# Patient Record
Sex: Female | Born: 1950 | Race: White | Hispanic: No | Marital: Single | Smoking: Current every day smoker
Health system: Southern US, Community
[De-identification: ages and names within clinical notes are randomized; demographics above are authoritative.]

## PROBLEM LIST (undated history)

## (undated) DIAGNOSIS — F32A Depression, unspecified: Secondary | ICD-10-CM

## (undated) DIAGNOSIS — I10 Essential (primary) hypertension: Secondary | ICD-10-CM

## (undated) DIAGNOSIS — N289 Disorder of kidney and ureter, unspecified: Secondary | ICD-10-CM

## (undated) DIAGNOSIS — M549 Dorsalgia, unspecified: Secondary | ICD-10-CM

## (undated) DIAGNOSIS — F329 Major depressive disorder, single episode, unspecified: Secondary | ICD-10-CM

## (undated) HISTORY — PX: ABDOMINAL HYSTERECTOMY: SHX81

## (undated) HISTORY — PX: OTHER SURGICAL HISTORY: SHX169

## (undated) HISTORY — PX: CHOLECYSTECTOMY: SHX55

## (undated) HISTORY — PX: BACK SURGERY: SHX140

---

## 1997-06-07 ENCOUNTER — Encounter: Admission: RE | Admit: 1997-06-07 | Discharge: 1997-06-07 | Payer: Self-pay | Admitting: Sports Medicine

## 1997-06-23 ENCOUNTER — Encounter: Admission: RE | Admit: 1997-06-23 | Discharge: 1997-06-23 | Payer: Self-pay | Admitting: Family Medicine

## 1997-07-04 ENCOUNTER — Encounter: Admission: RE | Admit: 1997-07-04 | Discharge: 1997-07-04 | Payer: Self-pay | Admitting: Family Medicine

## 1997-07-25 ENCOUNTER — Encounter: Admission: RE | Admit: 1997-07-25 | Discharge: 1997-07-25 | Payer: Self-pay | Admitting: Family Medicine

## 1997-07-27 ENCOUNTER — Emergency Department (HOSPITAL_COMMUNITY): Admission: EM | Admit: 1997-07-27 | Discharge: 1997-07-27 | Payer: Self-pay | Admitting: Emergency Medicine

## 1997-07-28 ENCOUNTER — Encounter: Admission: RE | Admit: 1997-07-28 | Discharge: 1997-07-28 | Payer: Self-pay | Admitting: Family Medicine

## 1997-08-04 ENCOUNTER — Encounter: Admission: RE | Admit: 1997-08-04 | Discharge: 1997-08-04 | Payer: Self-pay | Admitting: Family Medicine

## 1997-08-08 ENCOUNTER — Observation Stay (HOSPITAL_COMMUNITY): Admission: EM | Admit: 1997-08-08 | Discharge: 1997-08-08 | Payer: Self-pay | Admitting: Emergency Medicine

## 1997-08-08 ENCOUNTER — Inpatient Hospital Stay (HOSPITAL_COMMUNITY): Admission: AD | Admit: 1997-08-08 | Discharge: 1997-08-12 | Payer: Self-pay | Admitting: Psychiatry

## 1997-08-11 ENCOUNTER — Encounter: Admission: RE | Admit: 1997-08-11 | Discharge: 1997-08-11 | Payer: Self-pay | Admitting: Family Medicine

## 1997-08-18 ENCOUNTER — Encounter: Admission: RE | Admit: 1997-08-18 | Discharge: 1997-08-18 | Payer: Self-pay | Admitting: Family Medicine

## 1997-08-24 ENCOUNTER — Ambulatory Visit (HOSPITAL_COMMUNITY): Admission: RE | Admit: 1997-08-24 | Discharge: 1997-08-24 | Payer: Self-pay | Admitting: Family Medicine

## 1997-08-29 ENCOUNTER — Encounter: Admission: RE | Admit: 1997-08-29 | Discharge: 1997-08-29 | Payer: Self-pay | Admitting: Family Medicine

## 1997-09-01 ENCOUNTER — Encounter: Admission: RE | Admit: 1997-09-01 | Discharge: 1997-09-01 | Payer: Self-pay | Admitting: Family Medicine

## 1997-09-08 ENCOUNTER — Encounter: Admission: RE | Admit: 1997-09-08 | Discharge: 1997-09-08 | Payer: Self-pay | Admitting: Family Medicine

## 1997-09-21 ENCOUNTER — Encounter: Admission: RE | Admit: 1997-09-21 | Discharge: 1997-09-21 | Payer: Self-pay | Admitting: Family Medicine

## 1997-09-29 ENCOUNTER — Ambulatory Visit (HOSPITAL_COMMUNITY): Admission: RE | Admit: 1997-09-29 | Discharge: 1997-09-29 | Payer: Self-pay | Admitting: *Deleted

## 1997-09-29 ENCOUNTER — Encounter: Admission: RE | Admit: 1997-09-29 | Discharge: 1997-09-29 | Payer: Self-pay | Admitting: Family Medicine

## 1997-09-30 ENCOUNTER — Encounter: Admission: RE | Admit: 1997-09-30 | Discharge: 1997-09-30 | Payer: Self-pay | Admitting: Family Medicine

## 1997-10-03 ENCOUNTER — Encounter: Admission: RE | Admit: 1997-10-03 | Discharge: 1997-10-03 | Payer: Self-pay | Admitting: Family Medicine

## 1997-10-10 ENCOUNTER — Encounter: Admission: RE | Admit: 1997-10-10 | Discharge: 1997-10-10 | Payer: Self-pay | Admitting: Family Medicine

## 1997-10-12 ENCOUNTER — Ambulatory Visit (HOSPITAL_COMMUNITY): Admission: RE | Admit: 1997-10-12 | Discharge: 1997-10-12 | Payer: Self-pay | Admitting: Family Medicine

## 1997-10-20 ENCOUNTER — Encounter: Admission: RE | Admit: 1997-10-20 | Discharge: 1997-10-20 | Payer: Self-pay | Admitting: Family Medicine

## 1997-10-25 ENCOUNTER — Encounter: Admission: RE | Admit: 1997-10-25 | Discharge: 1997-10-25 | Payer: Self-pay | Admitting: Sports Medicine

## 1997-10-26 ENCOUNTER — Ambulatory Visit (HOSPITAL_COMMUNITY): Admission: RE | Admit: 1997-10-26 | Discharge: 1997-10-26 | Payer: Self-pay | Admitting: Family Medicine

## 1997-11-03 ENCOUNTER — Encounter: Admission: RE | Admit: 1997-11-03 | Discharge: 1997-11-03 | Payer: Self-pay | Admitting: Family Medicine

## 1997-11-16 ENCOUNTER — Encounter: Admission: RE | Admit: 1997-11-16 | Discharge: 1997-11-16 | Payer: Self-pay | Admitting: Family Medicine

## 1997-11-28 ENCOUNTER — Encounter: Admission: RE | Admit: 1997-11-28 | Discharge: 1997-11-28 | Payer: Self-pay | Admitting: Family Medicine

## 1997-12-01 ENCOUNTER — Encounter: Admission: RE | Admit: 1997-12-01 | Discharge: 1997-12-01 | Payer: Self-pay | Admitting: Family Medicine

## 1997-12-04 ENCOUNTER — Emergency Department (HOSPITAL_COMMUNITY): Admission: EM | Admit: 1997-12-04 | Discharge: 1997-12-04 | Payer: Self-pay | Admitting: Emergency Medicine

## 1997-12-04 ENCOUNTER — Encounter: Payer: Self-pay | Admitting: Emergency Medicine

## 1997-12-08 ENCOUNTER — Encounter: Admission: RE | Admit: 1997-12-08 | Discharge: 1997-12-08 | Payer: Self-pay | Admitting: Family Medicine

## 1997-12-11 ENCOUNTER — Emergency Department (HOSPITAL_COMMUNITY): Admission: EM | Admit: 1997-12-11 | Discharge: 1997-12-11 | Payer: Self-pay | Admitting: Emergency Medicine

## 1997-12-13 ENCOUNTER — Encounter: Admission: RE | Admit: 1997-12-13 | Discharge: 1998-03-13 | Payer: Self-pay | Admitting: Family Medicine

## 1997-12-15 ENCOUNTER — Encounter: Admission: RE | Admit: 1997-12-15 | Discharge: 1997-12-15 | Payer: Self-pay | Admitting: Family Medicine

## 1997-12-19 ENCOUNTER — Encounter: Admission: RE | Admit: 1997-12-19 | Discharge: 1998-03-19 | Payer: Self-pay | Admitting: Anesthesiology

## 1997-12-29 ENCOUNTER — Encounter: Admission: RE | Admit: 1997-12-29 | Discharge: 1997-12-29 | Payer: Self-pay | Admitting: Family Medicine

## 1997-12-31 ENCOUNTER — Emergency Department (HOSPITAL_COMMUNITY): Admission: EM | Admit: 1997-12-31 | Discharge: 1997-12-31 | Payer: Self-pay

## 1998-01-05 ENCOUNTER — Encounter: Admission: RE | Admit: 1998-01-05 | Discharge: 1998-01-05 | Payer: Self-pay | Admitting: Family Medicine

## 1998-01-09 ENCOUNTER — Encounter: Admission: RE | Admit: 1998-01-09 | Discharge: 1998-04-09 | Payer: Self-pay | Admitting: Sports Medicine

## 1998-01-16 ENCOUNTER — Encounter: Admission: RE | Admit: 1998-01-16 | Discharge: 1998-01-16 | Payer: Self-pay | Admitting: Family Medicine

## 1998-01-19 ENCOUNTER — Encounter: Admission: RE | Admit: 1998-01-19 | Discharge: 1998-01-19 | Payer: Self-pay | Admitting: Family Medicine

## 1998-02-09 ENCOUNTER — Encounter: Admission: RE | Admit: 1998-02-09 | Discharge: 1998-02-09 | Payer: Self-pay | Admitting: Family Medicine

## 1998-03-01 ENCOUNTER — Encounter: Payer: Self-pay | Admitting: Sports Medicine

## 1998-03-01 ENCOUNTER — Ambulatory Visit (HOSPITAL_COMMUNITY): Admission: RE | Admit: 1998-03-01 | Discharge: 1998-03-01 | Payer: Self-pay | Admitting: Sports Medicine

## 1998-03-16 ENCOUNTER — Encounter: Payer: Self-pay | Admitting: Sports Medicine

## 1998-03-16 ENCOUNTER — Ambulatory Visit (HOSPITAL_COMMUNITY): Admission: RE | Admit: 1998-03-16 | Discharge: 1998-03-16 | Payer: Self-pay | Admitting: Sports Medicine

## 1998-04-05 ENCOUNTER — Encounter: Payer: Self-pay | Admitting: Sports Medicine

## 1998-04-05 ENCOUNTER — Ambulatory Visit (HOSPITAL_COMMUNITY): Admission: RE | Admit: 1998-04-05 | Discharge: 1998-04-05 | Payer: Self-pay | Admitting: Sports Medicine

## 1998-04-19 ENCOUNTER — Ambulatory Visit (HOSPITAL_COMMUNITY): Admission: RE | Admit: 1998-04-19 | Discharge: 1998-04-19 | Payer: Self-pay | Admitting: Sports Medicine

## 1998-04-19 ENCOUNTER — Encounter: Payer: Self-pay | Admitting: Sports Medicine

## 1998-04-20 ENCOUNTER — Encounter: Admission: RE | Admit: 1998-04-20 | Discharge: 1998-04-20 | Payer: Self-pay | Admitting: Family Medicine

## 1998-05-02 ENCOUNTER — Encounter: Admission: RE | Admit: 1998-05-02 | Discharge: 1998-05-02 | Payer: Self-pay | Admitting: Family Medicine

## 1998-05-05 ENCOUNTER — Encounter: Admission: RE | Admit: 1998-05-05 | Discharge: 1998-05-05 | Payer: Self-pay | Admitting: Family Medicine

## 1998-05-08 ENCOUNTER — Encounter: Admission: RE | Admit: 1998-05-08 | Discharge: 1998-05-08 | Payer: Self-pay | Admitting: Family Medicine

## 1998-05-18 ENCOUNTER — Encounter: Admission: RE | Admit: 1998-05-18 | Discharge: 1998-05-18 | Payer: Self-pay | Admitting: Family Medicine

## 1998-06-01 ENCOUNTER — Encounter: Admission: RE | Admit: 1998-06-01 | Discharge: 1998-06-01 | Payer: Self-pay | Admitting: Family Medicine

## 1998-06-20 ENCOUNTER — Encounter: Admission: RE | Admit: 1998-06-20 | Discharge: 1998-06-20 | Payer: Self-pay | Admitting: Family Medicine

## 1998-06-29 ENCOUNTER — Encounter: Admission: RE | Admit: 1998-06-29 | Discharge: 1998-06-29 | Payer: Self-pay | Admitting: Family Medicine

## 1998-07-17 ENCOUNTER — Inpatient Hospital Stay (HOSPITAL_COMMUNITY): Admission: EM | Admit: 1998-07-17 | Discharge: 1998-07-26 | Payer: Self-pay | Admitting: Emergency Medicine

## 1998-08-04 ENCOUNTER — Emergency Department (HOSPITAL_COMMUNITY): Admission: EM | Admit: 1998-08-04 | Discharge: 1998-08-04 | Payer: Self-pay | Admitting: Emergency Medicine

## 1998-08-10 ENCOUNTER — Encounter: Admission: RE | Admit: 1998-08-10 | Discharge: 1998-08-10 | Payer: Self-pay | Admitting: Family Medicine

## 1998-08-18 ENCOUNTER — Encounter: Admission: RE | Admit: 1998-08-18 | Discharge: 1998-08-18 | Payer: Self-pay | Admitting: Family Medicine

## 1998-08-21 ENCOUNTER — Encounter: Admission: RE | Admit: 1998-08-21 | Discharge: 1998-08-21 | Payer: Self-pay | Admitting: Family Medicine

## 1998-08-23 ENCOUNTER — Encounter: Admission: RE | Admit: 1998-08-23 | Discharge: 1998-09-25 | Payer: Self-pay | Admitting: Sports Medicine

## 1998-09-06 ENCOUNTER — Encounter: Admission: RE | Admit: 1998-09-06 | Discharge: 1998-09-06 | Payer: Self-pay | Admitting: Family Medicine

## 1998-09-09 ENCOUNTER — Emergency Department (HOSPITAL_COMMUNITY): Admission: EM | Admit: 1998-09-09 | Discharge: 1998-09-09 | Payer: Self-pay | Admitting: Emergency Medicine

## 1998-09-13 ENCOUNTER — Ambulatory Visit (HOSPITAL_COMMUNITY): Admission: RE | Admit: 1998-09-13 | Discharge: 1998-09-13 | Payer: Self-pay | Admitting: Urology

## 1998-09-13 ENCOUNTER — Encounter: Payer: Self-pay | Admitting: Urology

## 1998-09-18 ENCOUNTER — Encounter: Admission: RE | Admit: 1998-09-18 | Discharge: 1998-09-18 | Payer: Self-pay | Admitting: Family Medicine

## 1998-09-26 ENCOUNTER — Encounter: Admission: RE | Admit: 1998-09-26 | Discharge: 1998-09-26 | Payer: Self-pay | Admitting: Sports Medicine

## 1998-09-28 ENCOUNTER — Encounter: Admission: RE | Admit: 1998-09-28 | Discharge: 1998-09-28 | Payer: Self-pay | Admitting: Sports Medicine

## 1998-10-03 ENCOUNTER — Encounter: Admission: RE | Admit: 1998-10-03 | Discharge: 1998-10-03 | Payer: Self-pay | Admitting: Family Medicine

## 1998-10-05 ENCOUNTER — Encounter: Admission: RE | Admit: 1998-10-05 | Discharge: 1998-10-05 | Payer: Self-pay | Admitting: Family Medicine

## 1998-10-12 ENCOUNTER — Encounter: Admission: RE | Admit: 1998-10-12 | Discharge: 1998-10-12 | Payer: Self-pay | Admitting: Family Medicine

## 1998-10-19 ENCOUNTER — Encounter: Admission: RE | Admit: 1998-10-19 | Discharge: 1998-10-19 | Payer: Self-pay | Admitting: Family Medicine

## 1998-10-26 ENCOUNTER — Encounter: Admission: RE | Admit: 1998-10-26 | Discharge: 1998-10-26 | Payer: Self-pay | Admitting: Family Medicine

## 1998-11-23 ENCOUNTER — Encounter: Admission: RE | Admit: 1998-11-23 | Discharge: 1998-11-23 | Payer: Self-pay | Admitting: Family Medicine

## 1998-12-20 ENCOUNTER — Encounter: Admission: RE | Admit: 1998-12-20 | Discharge: 1998-12-20 | Payer: Self-pay | Admitting: Family Medicine

## 1998-12-26 ENCOUNTER — Encounter: Admission: RE | Admit: 1998-12-26 | Discharge: 1998-12-26 | Payer: Self-pay | Admitting: Family Medicine

## 1998-12-28 ENCOUNTER — Encounter: Admission: RE | Admit: 1998-12-28 | Discharge: 1998-12-28 | Payer: Self-pay | Admitting: Family Medicine

## 1999-01-04 ENCOUNTER — Encounter: Admission: RE | Admit: 1999-01-04 | Discharge: 1999-01-04 | Payer: Self-pay | Admitting: Family Medicine

## 1999-01-11 ENCOUNTER — Encounter: Admission: RE | Admit: 1999-01-11 | Discharge: 1999-01-11 | Payer: Self-pay | Admitting: Family Medicine

## 1999-01-15 ENCOUNTER — Ambulatory Visit (HOSPITAL_COMMUNITY): Admission: RE | Admit: 1999-01-15 | Discharge: 1999-01-15 | Payer: Self-pay | Admitting: Family Medicine

## 1999-01-16 ENCOUNTER — Encounter: Payer: Self-pay | Admitting: *Deleted

## 1999-01-16 ENCOUNTER — Emergency Department (HOSPITAL_COMMUNITY): Admission: EM | Admit: 1999-01-16 | Discharge: 1999-01-16 | Payer: Self-pay | Admitting: *Deleted

## 1999-01-18 ENCOUNTER — Encounter: Admission: RE | Admit: 1999-01-18 | Discharge: 1999-01-18 | Payer: Self-pay | Admitting: Family Medicine

## 1999-02-15 ENCOUNTER — Encounter: Admission: RE | Admit: 1999-02-15 | Discharge: 1999-02-15 | Payer: Self-pay | Admitting: Family Medicine

## 1999-02-22 ENCOUNTER — Encounter: Admission: RE | Admit: 1999-02-22 | Discharge: 1999-02-22 | Payer: Self-pay | Admitting: Family Medicine

## 1999-03-26 ENCOUNTER — Encounter: Admission: RE | Admit: 1999-03-26 | Discharge: 1999-03-26 | Payer: Self-pay | Admitting: Family Medicine

## 1999-04-02 ENCOUNTER — Encounter: Admission: RE | Admit: 1999-04-02 | Discharge: 1999-04-02 | Payer: Self-pay | Admitting: Family Medicine

## 1999-04-09 ENCOUNTER — Encounter: Admission: RE | Admit: 1999-04-09 | Discharge: 1999-04-09 | Payer: Self-pay | Admitting: Family Medicine

## 1999-04-20 ENCOUNTER — Encounter: Admission: RE | Admit: 1999-04-20 | Discharge: 1999-04-20 | Payer: Self-pay | Admitting: Family Medicine

## 1999-05-28 ENCOUNTER — Encounter: Admission: RE | Admit: 1999-05-28 | Discharge: 1999-05-28 | Payer: Self-pay | Admitting: Family Medicine

## 1999-06-01 ENCOUNTER — Encounter: Admission: RE | Admit: 1999-06-01 | Discharge: 1999-06-01 | Payer: Self-pay | Admitting: Sports Medicine

## 1999-06-04 ENCOUNTER — Encounter: Admission: RE | Admit: 1999-06-04 | Discharge: 1999-06-04 | Payer: Self-pay | Admitting: Family Medicine

## 1999-06-14 ENCOUNTER — Encounter: Admission: RE | Admit: 1999-06-14 | Discharge: 1999-06-14 | Payer: Self-pay | Admitting: Family Medicine

## 1999-06-22 ENCOUNTER — Encounter: Admission: RE | Admit: 1999-06-22 | Discharge: 1999-06-22 | Payer: Self-pay | Admitting: Family Medicine

## 1999-07-02 ENCOUNTER — Encounter: Admission: RE | Admit: 1999-07-02 | Discharge: 1999-07-02 | Payer: Self-pay | Admitting: Family Medicine

## 1999-07-16 ENCOUNTER — Encounter: Admission: RE | Admit: 1999-07-16 | Discharge: 1999-07-16 | Payer: Self-pay | Admitting: Family Medicine

## 1999-07-24 ENCOUNTER — Encounter: Admission: RE | Admit: 1999-07-24 | Discharge: 1999-07-24 | Payer: Self-pay | Admitting: Sports Medicine

## 1999-07-27 ENCOUNTER — Encounter: Admission: RE | Admit: 1999-07-27 | Discharge: 1999-07-27 | Payer: Self-pay | Admitting: Family Medicine

## 1999-07-27 ENCOUNTER — Emergency Department (HOSPITAL_COMMUNITY): Admission: EM | Admit: 1999-07-27 | Discharge: 1999-07-27 | Payer: Self-pay | Admitting: Emergency Medicine

## 1999-07-27 ENCOUNTER — Encounter: Admission: RE | Admit: 1999-07-27 | Discharge: 1999-07-27 | Payer: Self-pay

## 1999-07-29 ENCOUNTER — Emergency Department (HOSPITAL_COMMUNITY): Admission: EM | Admit: 1999-07-29 | Discharge: 1999-07-29 | Payer: Self-pay | Admitting: Emergency Medicine

## 1999-07-31 ENCOUNTER — Encounter: Admission: RE | Admit: 1999-07-31 | Discharge: 1999-07-31 | Payer: Self-pay | Admitting: Sports Medicine

## 1999-08-01 ENCOUNTER — Encounter: Admission: RE | Admit: 1999-08-01 | Discharge: 1999-08-01 | Payer: Self-pay | Admitting: Family Medicine

## 1999-08-05 ENCOUNTER — Emergency Department (HOSPITAL_COMMUNITY): Admission: EM | Admit: 1999-08-05 | Discharge: 1999-08-05 | Payer: Self-pay | Admitting: Emergency Medicine

## 1999-08-06 ENCOUNTER — Encounter: Admission: RE | Admit: 1999-08-06 | Discharge: 1999-08-06 | Payer: Self-pay | Admitting: Family Medicine

## 1999-08-09 ENCOUNTER — Encounter: Admission: RE | Admit: 1999-08-09 | Discharge: 1999-08-09 | Payer: Self-pay | Admitting: Family Medicine

## 1999-08-14 ENCOUNTER — Encounter: Payer: Self-pay | Admitting: Family Medicine

## 1999-08-14 ENCOUNTER — Encounter: Admission: RE | Admit: 1999-08-14 | Discharge: 1999-08-14 | Payer: Self-pay | Admitting: Family Medicine

## 1999-08-16 ENCOUNTER — Encounter: Admission: RE | Admit: 1999-08-16 | Discharge: 1999-08-16 | Payer: Self-pay | Admitting: Family Medicine

## 1999-08-18 ENCOUNTER — Emergency Department (HOSPITAL_COMMUNITY): Admission: EM | Admit: 1999-08-18 | Discharge: 1999-08-18 | Payer: Self-pay | Admitting: Emergency Medicine

## 1999-09-12 ENCOUNTER — Ambulatory Visit (HOSPITAL_COMMUNITY): Admission: RE | Admit: 1999-09-12 | Discharge: 1999-09-12 | Payer: Self-pay | Admitting: Gastroenterology

## 1999-09-28 ENCOUNTER — Encounter: Payer: Self-pay | Admitting: Internal Medicine

## 1999-09-28 ENCOUNTER — Emergency Department (HOSPITAL_COMMUNITY): Admission: EM | Admit: 1999-09-28 | Discharge: 1999-09-28 | Payer: Self-pay | Admitting: Internal Medicine

## 1999-12-24 ENCOUNTER — Encounter: Admission: RE | Admit: 1999-12-24 | Discharge: 1999-12-24 | Payer: Self-pay | Admitting: Family Medicine

## 2000-02-21 ENCOUNTER — Encounter: Admission: RE | Admit: 2000-02-21 | Discharge: 2000-02-21 | Payer: Self-pay | Admitting: Family Medicine

## 2000-02-21 ENCOUNTER — Encounter: Payer: Self-pay | Admitting: Family Medicine

## 2000-03-27 ENCOUNTER — Encounter: Admission: RE | Admit: 2000-03-27 | Discharge: 2000-03-27 | Payer: Self-pay | Admitting: Family Medicine

## 2002-08-08 ENCOUNTER — Encounter: Payer: Self-pay | Admitting: Emergency Medicine

## 2002-08-08 ENCOUNTER — Emergency Department (HOSPITAL_COMMUNITY): Admission: EM | Admit: 2002-08-08 | Discharge: 2002-08-08 | Payer: Self-pay | Admitting: Emergency Medicine

## 2002-08-10 ENCOUNTER — Encounter: Payer: Self-pay | Admitting: Interventional Cardiology

## 2002-08-10 ENCOUNTER — Inpatient Hospital Stay (HOSPITAL_COMMUNITY): Admission: EM | Admit: 2002-08-10 | Discharge: 2002-08-11 | Payer: Self-pay | Admitting: Emergency Medicine

## 2003-04-23 ENCOUNTER — Emergency Department (HOSPITAL_COMMUNITY): Admission: EM | Admit: 2003-04-23 | Discharge: 2003-04-23 | Payer: Self-pay | Admitting: Emergency Medicine

## 2004-01-27 ENCOUNTER — Emergency Department (HOSPITAL_COMMUNITY): Admission: EM | Admit: 2004-01-27 | Discharge: 2004-01-27 | Payer: Self-pay

## 2004-07-24 ENCOUNTER — Emergency Department (HOSPITAL_COMMUNITY): Admission: EM | Admit: 2004-07-24 | Discharge: 2004-07-24 | Payer: Self-pay | Admitting: Emergency Medicine

## 2004-07-26 ENCOUNTER — Emergency Department (HOSPITAL_COMMUNITY): Admission: EM | Admit: 2004-07-26 | Discharge: 2004-07-26 | Payer: Self-pay | Admitting: Emergency Medicine

## 2004-08-04 ENCOUNTER — Emergency Department (HOSPITAL_COMMUNITY): Admission: EM | Admit: 2004-08-04 | Discharge: 2004-08-04 | Payer: Self-pay | Admitting: Emergency Medicine

## 2004-09-11 ENCOUNTER — Emergency Department (HOSPITAL_COMMUNITY): Admission: EM | Admit: 2004-09-11 | Discharge: 2004-09-11 | Payer: Self-pay | Admitting: Emergency Medicine

## 2004-10-01 ENCOUNTER — Emergency Department (HOSPITAL_COMMUNITY): Admission: EM | Admit: 2004-10-01 | Discharge: 2004-10-01 | Payer: Self-pay | Admitting: Emergency Medicine

## 2004-10-23 ENCOUNTER — Encounter: Admission: RE | Admit: 2004-10-23 | Discharge: 2004-10-23 | Payer: Self-pay | Admitting: Internal Medicine

## 2004-10-30 ENCOUNTER — Encounter: Admission: RE | Admit: 2004-10-30 | Discharge: 2004-10-30 | Payer: Self-pay | Admitting: Internal Medicine

## 2004-11-05 ENCOUNTER — Emergency Department (HOSPITAL_COMMUNITY): Admission: EM | Admit: 2004-11-05 | Discharge: 2004-11-05 | Payer: Self-pay | Admitting: *Deleted

## 2004-11-08 ENCOUNTER — Emergency Department (HOSPITAL_COMMUNITY): Admission: EM | Admit: 2004-11-08 | Discharge: 2004-11-08 | Payer: Self-pay | Admitting: *Deleted

## 2004-12-05 ENCOUNTER — Emergency Department (HOSPITAL_COMMUNITY): Admission: EM | Admit: 2004-12-05 | Discharge: 2004-12-05 | Payer: Self-pay | Admitting: Emergency Medicine

## 2004-12-24 ENCOUNTER — Emergency Department (HOSPITAL_COMMUNITY): Admission: EM | Admit: 2004-12-24 | Discharge: 2004-12-24 | Payer: Self-pay | Admitting: Emergency Medicine

## 2005-01-03 ENCOUNTER — Other Ambulatory Visit: Payer: Self-pay

## 2005-01-03 ENCOUNTER — Emergency Department (HOSPITAL_COMMUNITY): Admission: EM | Admit: 2005-01-03 | Discharge: 2005-01-03 | Payer: Self-pay | Admitting: Emergency Medicine

## 2005-01-04 ENCOUNTER — Other Ambulatory Visit: Payer: Self-pay

## 2005-01-09 ENCOUNTER — Encounter (INDEPENDENT_AMBULATORY_CARE_PROVIDER_SITE_OTHER): Payer: Self-pay | Admitting: Specialist

## 2005-01-09 ENCOUNTER — Ambulatory Visit (HOSPITAL_COMMUNITY): Admission: RE | Admit: 2005-01-09 | Discharge: 2005-01-09 | Payer: Self-pay | Admitting: Gastroenterology

## 2005-01-11 ENCOUNTER — Emergency Department (HOSPITAL_COMMUNITY): Admission: EM | Admit: 2005-01-11 | Discharge: 2005-01-11 | Payer: Self-pay | Admitting: Emergency Medicine

## 2005-01-12 ENCOUNTER — Emergency Department (HOSPITAL_COMMUNITY): Admission: EM | Admit: 2005-01-12 | Discharge: 2005-01-12 | Payer: Self-pay | Admitting: Emergency Medicine

## 2005-02-15 ENCOUNTER — Emergency Department (HOSPITAL_COMMUNITY): Admission: EM | Admit: 2005-02-15 | Discharge: 2005-02-15 | Payer: Self-pay | Admitting: Emergency Medicine

## 2005-02-16 ENCOUNTER — Emergency Department (HOSPITAL_COMMUNITY): Admission: EM | Admit: 2005-02-16 | Discharge: 2005-02-16 | Payer: Self-pay | Admitting: Emergency Medicine

## 2005-02-19 ENCOUNTER — Encounter: Admission: RE | Admit: 2005-02-19 | Discharge: 2005-02-19 | Payer: Self-pay | Admitting: Gastroenterology

## 2005-03-22 ENCOUNTER — Emergency Department (HOSPITAL_COMMUNITY): Admission: EM | Admit: 2005-03-22 | Discharge: 2005-03-22 | Payer: Self-pay | Admitting: Emergency Medicine

## 2005-04-04 ENCOUNTER — Observation Stay (HOSPITAL_COMMUNITY): Admission: EM | Admit: 2005-04-04 | Discharge: 2005-04-05 | Payer: Self-pay | Admitting: Emergency Medicine

## 2005-04-09 ENCOUNTER — Emergency Department (HOSPITAL_COMMUNITY): Admission: EM | Admit: 2005-04-09 | Discharge: 2005-04-09 | Payer: Self-pay | Admitting: Emergency Medicine

## 2005-05-02 ENCOUNTER — Encounter (INDEPENDENT_AMBULATORY_CARE_PROVIDER_SITE_OTHER): Payer: Self-pay | Admitting: *Deleted

## 2005-05-02 ENCOUNTER — Encounter: Admission: RE | Admit: 2005-05-02 | Discharge: 2005-05-02 | Payer: Self-pay | Admitting: Endocrinology

## 2005-05-02 ENCOUNTER — Other Ambulatory Visit: Admission: RE | Admit: 2005-05-02 | Discharge: 2005-05-02 | Payer: Self-pay | Admitting: Interventional Radiology

## 2005-05-08 ENCOUNTER — Emergency Department (HOSPITAL_COMMUNITY): Admission: EM | Admit: 2005-05-08 | Discharge: 2005-05-08 | Payer: Self-pay | Admitting: Emergency Medicine

## 2005-05-16 ENCOUNTER — Emergency Department (HOSPITAL_COMMUNITY): Admission: EM | Admit: 2005-05-16 | Discharge: 2005-05-16 | Payer: Self-pay | Admitting: Emergency Medicine

## 2005-05-17 ENCOUNTER — Emergency Department (HOSPITAL_COMMUNITY): Admission: EM | Admit: 2005-05-17 | Discharge: 2005-05-18 | Payer: Self-pay | Admitting: Emergency Medicine

## 2005-05-19 ENCOUNTER — Emergency Department (HOSPITAL_COMMUNITY): Admission: EM | Admit: 2005-05-19 | Discharge: 2005-05-19 | Payer: Self-pay | Admitting: Emergency Medicine

## 2005-05-28 ENCOUNTER — Emergency Department (HOSPITAL_COMMUNITY): Admission: EM | Admit: 2005-05-28 | Discharge: 2005-05-28 | Payer: Self-pay | Admitting: Emergency Medicine

## 2005-06-18 ENCOUNTER — Emergency Department (HOSPITAL_COMMUNITY): Admission: EM | Admit: 2005-06-18 | Discharge: 2005-06-18 | Payer: Self-pay | Admitting: Emergency Medicine

## 2005-06-20 ENCOUNTER — Observation Stay (HOSPITAL_COMMUNITY): Admission: EM | Admit: 2005-06-20 | Discharge: 2005-06-20 | Payer: Self-pay | Admitting: Emergency Medicine

## 2005-06-21 ENCOUNTER — Emergency Department (HOSPITAL_COMMUNITY): Admission: EM | Admit: 2005-06-21 | Discharge: 2005-06-22 | Payer: Self-pay | Admitting: Emergency Medicine

## 2005-07-17 ENCOUNTER — Emergency Department (HOSPITAL_COMMUNITY): Admission: EM | Admit: 2005-07-17 | Discharge: 2005-07-17 | Payer: Self-pay | Admitting: *Deleted

## 2005-08-10 ENCOUNTER — Emergency Department (HOSPITAL_COMMUNITY): Admission: EM | Admit: 2005-08-10 | Discharge: 2005-08-10 | Payer: Self-pay | Admitting: Emergency Medicine

## 2005-08-19 ENCOUNTER — Emergency Department (HOSPITAL_COMMUNITY): Admission: EM | Admit: 2005-08-19 | Discharge: 2005-08-19 | Payer: Self-pay | Admitting: Emergency Medicine

## 2005-08-20 ENCOUNTER — Emergency Department (HOSPITAL_COMMUNITY): Admission: EM | Admit: 2005-08-20 | Discharge: 2005-08-20 | Payer: Self-pay | Admitting: Emergency Medicine

## 2005-09-06 ENCOUNTER — Emergency Department (HOSPITAL_COMMUNITY): Admission: EM | Admit: 2005-09-06 | Discharge: 2005-09-06 | Payer: Self-pay | Admitting: *Deleted

## 2005-09-25 ENCOUNTER — Emergency Department (HOSPITAL_COMMUNITY): Admission: EM | Admit: 2005-09-25 | Discharge: 2005-09-25 | Payer: Self-pay | Admitting: Emergency Medicine

## 2005-09-30 ENCOUNTER — Emergency Department (HOSPITAL_COMMUNITY): Admission: EM | Admit: 2005-09-30 | Discharge: 2005-09-30 | Payer: Self-pay | Admitting: *Deleted

## 2005-10-02 ENCOUNTER — Emergency Department (HOSPITAL_COMMUNITY): Admission: EM | Admit: 2005-10-02 | Discharge: 2005-10-02 | Payer: Self-pay | Admitting: Emergency Medicine

## 2005-10-03 ENCOUNTER — Emergency Department (HOSPITAL_COMMUNITY): Admission: EM | Admit: 2005-10-03 | Discharge: 2005-10-03 | Payer: Self-pay | Admitting: Emergency Medicine

## 2005-10-07 ENCOUNTER — Emergency Department (HOSPITAL_COMMUNITY): Admission: EM | Admit: 2005-10-07 | Discharge: 2005-10-07 | Payer: Self-pay | Admitting: Emergency Medicine

## 2005-10-07 ENCOUNTER — Emergency Department (HOSPITAL_COMMUNITY): Admission: EM | Admit: 2005-10-07 | Discharge: 2005-10-08 | Payer: Self-pay | Admitting: Emergency Medicine

## 2005-10-09 ENCOUNTER — Emergency Department (HOSPITAL_COMMUNITY): Admission: EM | Admit: 2005-10-09 | Discharge: 2005-10-09 | Payer: Self-pay | Admitting: Emergency Medicine

## 2005-10-11 ENCOUNTER — Inpatient Hospital Stay (HOSPITAL_COMMUNITY): Admission: EM | Admit: 2005-10-11 | Discharge: 2005-10-12 | Payer: Self-pay | Admitting: Emergency Medicine

## 2005-10-13 ENCOUNTER — Emergency Department (HOSPITAL_COMMUNITY): Admission: EM | Admit: 2005-10-13 | Discharge: 2005-10-13 | Payer: Self-pay | Admitting: *Deleted

## 2005-10-23 ENCOUNTER — Emergency Department (HOSPITAL_COMMUNITY): Admission: EM | Admit: 2005-10-23 | Discharge: 2005-10-23 | Payer: Self-pay | Admitting: Emergency Medicine

## 2005-11-23 ENCOUNTER — Emergency Department (HOSPITAL_COMMUNITY): Admission: EM | Admit: 2005-11-23 | Discharge: 2005-11-24 | Payer: Self-pay | Admitting: Emergency Medicine

## 2005-12-12 ENCOUNTER — Emergency Department (HOSPITAL_COMMUNITY): Admission: EM | Admit: 2005-12-12 | Discharge: 2005-12-12 | Payer: Self-pay | Admitting: Emergency Medicine

## 2005-12-31 ENCOUNTER — Encounter: Admission: RE | Admit: 2005-12-31 | Discharge: 2005-12-31 | Payer: Self-pay | Admitting: Orthopedic Surgery

## 2006-01-22 ENCOUNTER — Encounter: Admission: RE | Admit: 2006-01-22 | Discharge: 2006-01-22 | Payer: Self-pay | Admitting: Orthopedic Surgery

## 2006-02-10 ENCOUNTER — Emergency Department (HOSPITAL_COMMUNITY): Admission: EM | Admit: 2006-02-10 | Discharge: 2006-02-10 | Payer: Self-pay | Admitting: Emergency Medicine

## 2006-02-11 ENCOUNTER — Emergency Department (HOSPITAL_COMMUNITY): Admission: EM | Admit: 2006-02-11 | Discharge: 2006-02-11 | Payer: Self-pay | Admitting: Family Medicine

## 2006-02-12 ENCOUNTER — Emergency Department (HOSPITAL_COMMUNITY): Admission: EM | Admit: 2006-02-12 | Discharge: 2006-02-12 | Payer: Self-pay | Admitting: Emergency Medicine

## 2006-02-14 ENCOUNTER — Observation Stay (HOSPITAL_COMMUNITY): Admission: EM | Admit: 2006-02-14 | Discharge: 2006-02-15 | Payer: Self-pay | Admitting: Emergency Medicine

## 2006-02-16 ENCOUNTER — Emergency Department (HOSPITAL_COMMUNITY): Admission: EM | Admit: 2006-02-16 | Discharge: 2006-02-16 | Payer: Self-pay | Admitting: Emergency Medicine

## 2006-02-20 ENCOUNTER — Emergency Department (HOSPITAL_COMMUNITY): Admission: EM | Admit: 2006-02-20 | Discharge: 2006-02-20 | Payer: Self-pay | Admitting: Emergency Medicine

## 2006-02-26 ENCOUNTER — Encounter: Admission: RE | Admit: 2006-02-26 | Discharge: 2006-02-26 | Payer: Self-pay | Admitting: *Deleted

## 2006-03-06 ENCOUNTER — Emergency Department (HOSPITAL_COMMUNITY): Admission: EM | Admit: 2006-03-06 | Discharge: 2006-03-06 | Payer: Self-pay | Admitting: Emergency Medicine

## 2006-03-06 ENCOUNTER — Encounter: Admission: RE | Admit: 2006-03-06 | Discharge: 2006-03-06 | Payer: Self-pay | Admitting: Gastroenterology

## 2006-05-02 ENCOUNTER — Emergency Department (HOSPITAL_COMMUNITY): Admission: EM | Admit: 2006-05-02 | Discharge: 2006-05-02 | Payer: Self-pay | Admitting: Emergency Medicine

## 2006-05-10 ENCOUNTER — Emergency Department (HOSPITAL_COMMUNITY): Admission: EM | Admit: 2006-05-10 | Discharge: 2006-05-11 | Payer: Self-pay | Admitting: Emergency Medicine

## 2006-05-12 ENCOUNTER — Emergency Department (HOSPITAL_COMMUNITY): Admission: EM | Admit: 2006-05-12 | Discharge: 2006-05-13 | Payer: Self-pay | Admitting: Emergency Medicine

## 2006-05-12 ENCOUNTER — Emergency Department (HOSPITAL_COMMUNITY): Admission: EM | Admit: 2006-05-12 | Discharge: 2006-05-12 | Payer: Self-pay | Admitting: Emergency Medicine

## 2006-05-14 ENCOUNTER — Emergency Department (HOSPITAL_COMMUNITY): Admission: EM | Admit: 2006-05-14 | Discharge: 2006-05-15 | Payer: Self-pay | Admitting: Emergency Medicine

## 2006-05-25 ENCOUNTER — Emergency Department (HOSPITAL_COMMUNITY): Admission: EM | Admit: 2006-05-25 | Discharge: 2006-05-26 | Payer: Self-pay | Admitting: Emergency Medicine

## 2006-05-27 ENCOUNTER — Emergency Department (HOSPITAL_COMMUNITY): Admission: EM | Admit: 2006-05-27 | Discharge: 2006-05-27 | Payer: Self-pay | Admitting: *Deleted

## 2006-05-29 ENCOUNTER — Ambulatory Visit (HOSPITAL_COMMUNITY): Admission: AD | Admit: 2006-05-29 | Discharge: 2006-05-29 | Payer: Self-pay | Admitting: Urology

## 2006-05-31 ENCOUNTER — Emergency Department (HOSPITAL_COMMUNITY): Admission: EM | Admit: 2006-05-31 | Discharge: 2006-05-31 | Payer: Self-pay | Admitting: Emergency Medicine

## 2006-06-01 ENCOUNTER — Emergency Department (HOSPITAL_COMMUNITY): Admission: EM | Admit: 2006-06-01 | Discharge: 2006-06-02 | Payer: Self-pay | Admitting: *Deleted

## 2006-06-02 ENCOUNTER — Inpatient Hospital Stay (HOSPITAL_COMMUNITY): Admission: EM | Admit: 2006-06-02 | Discharge: 2006-06-04 | Payer: Self-pay | Admitting: Urology

## 2006-06-20 ENCOUNTER — Encounter: Admission: RE | Admit: 2006-06-20 | Discharge: 2006-06-20 | Payer: Self-pay | Admitting: *Deleted

## 2006-07-29 ENCOUNTER — Emergency Department (HOSPITAL_COMMUNITY): Admission: EM | Admit: 2006-07-29 | Discharge: 2006-07-29 | Payer: Self-pay | Admitting: *Deleted

## 2006-08-04 ENCOUNTER — Emergency Department (HOSPITAL_COMMUNITY): Admission: EM | Admit: 2006-08-04 | Discharge: 2006-08-04 | Payer: Self-pay | Admitting: Emergency Medicine

## 2006-08-15 ENCOUNTER — Emergency Department (HOSPITAL_COMMUNITY): Admission: EM | Admit: 2006-08-15 | Discharge: 2006-08-16 | Payer: Self-pay | Admitting: Emergency Medicine

## 2006-08-20 ENCOUNTER — Emergency Department (HOSPITAL_COMMUNITY): Admission: EM | Admit: 2006-08-20 | Discharge: 2006-08-20 | Payer: Self-pay | Admitting: Emergency Medicine

## 2006-10-09 ENCOUNTER — Emergency Department (HOSPITAL_COMMUNITY): Admission: EM | Admit: 2006-10-09 | Discharge: 2006-10-09 | Payer: Self-pay | Admitting: Emergency Medicine

## 2006-11-13 ENCOUNTER — Emergency Department (HOSPITAL_COMMUNITY): Admission: EM | Admit: 2006-11-13 | Discharge: 2006-11-13 | Payer: Self-pay | Admitting: Emergency Medicine

## 2006-11-25 ENCOUNTER — Emergency Department (HOSPITAL_COMMUNITY): Admission: EM | Admit: 2006-11-25 | Discharge: 2006-11-25 | Payer: Self-pay | Admitting: Emergency Medicine

## 2006-11-30 ENCOUNTER — Emergency Department (HOSPITAL_COMMUNITY): Admission: EM | Admit: 2006-11-30 | Discharge: 2006-12-01 | Payer: Self-pay | Admitting: Emergency Medicine

## 2006-12-04 ENCOUNTER — Emergency Department (HOSPITAL_COMMUNITY): Admission: EM | Admit: 2006-12-04 | Discharge: 2006-12-05 | Payer: Self-pay | Admitting: Emergency Medicine

## 2006-12-14 ENCOUNTER — Emergency Department (HOSPITAL_COMMUNITY): Admission: EM | Admit: 2006-12-14 | Discharge: 2006-12-14 | Payer: Self-pay | Admitting: Emergency Medicine

## 2006-12-19 ENCOUNTER — Emergency Department (HOSPITAL_COMMUNITY): Admission: EM | Admit: 2006-12-19 | Discharge: 2006-12-19 | Payer: Self-pay | Admitting: Emergency Medicine

## 2006-12-26 ENCOUNTER — Ambulatory Visit (HOSPITAL_COMMUNITY): Admission: RE | Admit: 2006-12-26 | Discharge: 2006-12-26 | Payer: Self-pay | Admitting: Urology

## 2007-01-03 ENCOUNTER — Emergency Department (HOSPITAL_COMMUNITY): Admission: EM | Admit: 2007-01-03 | Discharge: 2007-01-03 | Payer: Self-pay | Admitting: Emergency Medicine

## 2007-01-12 ENCOUNTER — Emergency Department (HOSPITAL_COMMUNITY): Admission: EM | Admit: 2007-01-12 | Discharge: 2007-01-13 | Payer: Self-pay | Admitting: Emergency Medicine

## 2007-01-21 ENCOUNTER — Emergency Department (HOSPITAL_COMMUNITY): Admission: EM | Admit: 2007-01-21 | Discharge: 2007-01-21 | Payer: Self-pay | Admitting: Emergency Medicine

## 2007-01-23 ENCOUNTER — Emergency Department (HOSPITAL_COMMUNITY): Admission: EM | Admit: 2007-01-23 | Discharge: 2007-01-23 | Payer: Self-pay | Admitting: Emergency Medicine

## 2007-02-16 ENCOUNTER — Emergency Department (HOSPITAL_COMMUNITY): Admission: EM | Admit: 2007-02-16 | Discharge: 2007-02-16 | Payer: Self-pay | Admitting: Emergency Medicine

## 2007-02-20 ENCOUNTER — Ambulatory Visit (HOSPITAL_COMMUNITY): Admission: RE | Admit: 2007-02-20 | Discharge: 2007-02-20 | Payer: Self-pay | Admitting: Urology

## 2007-03-02 ENCOUNTER — Observation Stay (HOSPITAL_COMMUNITY): Admission: EM | Admit: 2007-03-02 | Discharge: 2007-03-03 | Payer: Self-pay | Admitting: Emergency Medicine

## 2007-03-03 ENCOUNTER — Emergency Department (HOSPITAL_COMMUNITY): Admission: EM | Admit: 2007-03-03 | Discharge: 2007-03-04 | Payer: Self-pay | Admitting: Emergency Medicine

## 2007-04-08 ENCOUNTER — Encounter (HOSPITAL_COMMUNITY): Admission: RE | Admit: 2007-04-08 | Discharge: 2007-07-07 | Payer: Self-pay | Admitting: Endocrinology

## 2007-04-25 ENCOUNTER — Emergency Department (HOSPITAL_COMMUNITY): Admission: EM | Admit: 2007-04-25 | Discharge: 2007-04-26 | Payer: Self-pay | Admitting: Emergency Medicine

## 2007-04-27 ENCOUNTER — Emergency Department (HOSPITAL_COMMUNITY): Admission: EM | Admit: 2007-04-27 | Discharge: 2007-04-27 | Payer: Self-pay | Admitting: Emergency Medicine

## 2007-05-20 ENCOUNTER — Emergency Department (HOSPITAL_COMMUNITY): Admission: EM | Admit: 2007-05-20 | Discharge: 2007-05-20 | Payer: Self-pay | Admitting: Emergency Medicine

## 2007-06-07 ENCOUNTER — Emergency Department (HOSPITAL_COMMUNITY): Admission: EM | Admit: 2007-06-07 | Discharge: 2007-06-07 | Payer: Self-pay | Admitting: Emergency Medicine

## 2007-06-26 ENCOUNTER — Emergency Department (HOSPITAL_COMMUNITY): Admission: EM | Admit: 2007-06-26 | Discharge: 2007-06-27 | Payer: Self-pay | Admitting: Emergency Medicine

## 2007-06-28 ENCOUNTER — Emergency Department (HOSPITAL_COMMUNITY): Admission: EM | Admit: 2007-06-28 | Discharge: 2007-06-28 | Payer: Self-pay | Admitting: Emergency Medicine

## 2007-07-13 ENCOUNTER — Encounter: Admission: RE | Admit: 2007-07-13 | Discharge: 2007-07-13 | Payer: Self-pay | Admitting: Internal Medicine

## 2007-07-25 ENCOUNTER — Emergency Department (HOSPITAL_COMMUNITY): Admission: EM | Admit: 2007-07-25 | Discharge: 2007-07-25 | Payer: Self-pay | Admitting: Emergency Medicine

## 2007-08-30 ENCOUNTER — Emergency Department (HOSPITAL_COMMUNITY): Admission: EM | Admit: 2007-08-30 | Discharge: 2007-08-30 | Payer: Self-pay | Admitting: Family Medicine

## 2007-09-06 ENCOUNTER — Emergency Department (HOSPITAL_COMMUNITY): Admission: EM | Admit: 2007-09-06 | Discharge: 2007-09-06 | Payer: Self-pay | Admitting: Emergency Medicine

## 2007-09-27 ENCOUNTER — Inpatient Hospital Stay (HOSPITAL_COMMUNITY): Admission: EM | Admit: 2007-09-27 | Discharge: 2007-09-29 | Payer: Self-pay | Admitting: Emergency Medicine

## 2007-09-29 ENCOUNTER — Encounter (INDEPENDENT_AMBULATORY_CARE_PROVIDER_SITE_OTHER): Payer: Self-pay | Admitting: Internal Medicine

## 2007-10-06 ENCOUNTER — Encounter (HOSPITAL_COMMUNITY): Admission: RE | Admit: 2007-10-06 | Discharge: 2007-11-25 | Payer: Self-pay | Admitting: Endocrinology

## 2007-11-10 ENCOUNTER — Encounter: Admission: RE | Admit: 2007-11-10 | Discharge: 2007-11-10 | Payer: Self-pay | Admitting: Oral Surgery

## 2007-11-27 ENCOUNTER — Emergency Department (HOSPITAL_COMMUNITY): Admission: EM | Admit: 2007-11-27 | Discharge: 2007-11-28 | Payer: Self-pay | Admitting: Anesthesiology

## 2007-12-20 ENCOUNTER — Emergency Department (HOSPITAL_BASED_OUTPATIENT_CLINIC_OR_DEPARTMENT_OTHER): Admission: EM | Admit: 2007-12-20 | Discharge: 2007-12-20 | Payer: Self-pay | Admitting: Emergency Medicine

## 2008-01-04 ENCOUNTER — Ambulatory Visit (HOSPITAL_COMMUNITY): Admission: RE | Admit: 2008-01-04 | Discharge: 2008-01-04 | Payer: Self-pay | Admitting: Endocrinology

## 2008-01-07 ENCOUNTER — Encounter: Admission: RE | Admit: 2008-01-07 | Discharge: 2008-01-07 | Payer: Self-pay | Admitting: Orthopedic Surgery

## 2008-01-25 ENCOUNTER — Ambulatory Visit (HOSPITAL_COMMUNITY): Admission: RE | Admit: 2008-01-25 | Discharge: 2008-01-25 | Payer: Self-pay | Admitting: Oral Surgery

## 2008-01-25 ENCOUNTER — Encounter (INDEPENDENT_AMBULATORY_CARE_PROVIDER_SITE_OTHER): Payer: Self-pay | Admitting: Oral Surgery

## 2008-06-13 ENCOUNTER — Emergency Department (HOSPITAL_COMMUNITY): Admission: EM | Admit: 2008-06-13 | Discharge: 2008-06-13 | Payer: Self-pay | Admitting: Emergency Medicine

## 2008-11-08 ENCOUNTER — Encounter: Admission: RE | Admit: 2008-11-08 | Discharge: 2008-11-08 | Payer: Self-pay | Admitting: Orthopedic Surgery

## 2008-11-22 ENCOUNTER — Encounter: Admission: RE | Admit: 2008-11-22 | Discharge: 2008-11-22 | Payer: Self-pay | Admitting: Orthopedic Surgery

## 2009-01-15 ENCOUNTER — Emergency Department (HOSPITAL_COMMUNITY): Admission: EM | Admit: 2009-01-15 | Discharge: 2009-01-15 | Payer: Self-pay | Admitting: Emergency Medicine

## 2009-04-20 ENCOUNTER — Encounter: Admission: RE | Admit: 2009-04-20 | Discharge: 2009-04-20 | Payer: Self-pay | Admitting: Orthopedic Surgery

## 2009-05-10 ENCOUNTER — Encounter: Admission: RE | Admit: 2009-05-10 | Discharge: 2009-05-10 | Payer: Self-pay | Admitting: Orthopaedic Surgery

## 2009-06-12 ENCOUNTER — Encounter: Admission: RE | Admit: 2009-06-12 | Discharge: 2009-06-12 | Payer: Self-pay | Admitting: Orthopaedic Surgery

## 2009-07-28 IMAGING — CT CT ABDOMEN W/O CM
2 of 4 series · 17 of 46 positions shown, 19 images · non-contrast
Comparison: Prior CT 09/28/2007 and 06/07/2007.

CT ABDOMEN

CLINICAL DATA: Right flank pain today.  History of kidney stones.

CT ABDOMEN AND PELVIS WITHOUT CONTRAST
TECHNIQUE: Multidetector CT imaging of the abdomen and pelvis was
performed following the standard
protocol without intravenous contrast.

[Series 2: stone_wo 5.0 b40f st · axial · 0.57mm/px · z∈[-466,-118]mm · 14 of 95 slices shown, 16 images]
[im 4/95  soft-tissue]
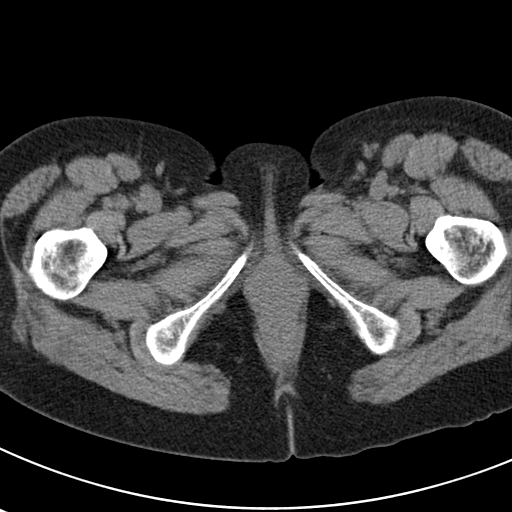
[im 4/95  bone]
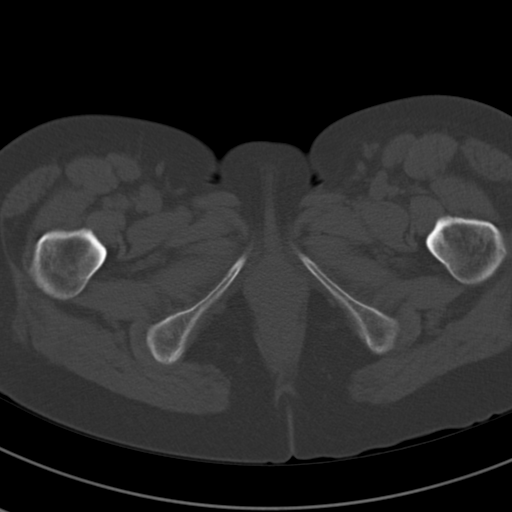
[im 11/95  soft-tissue]
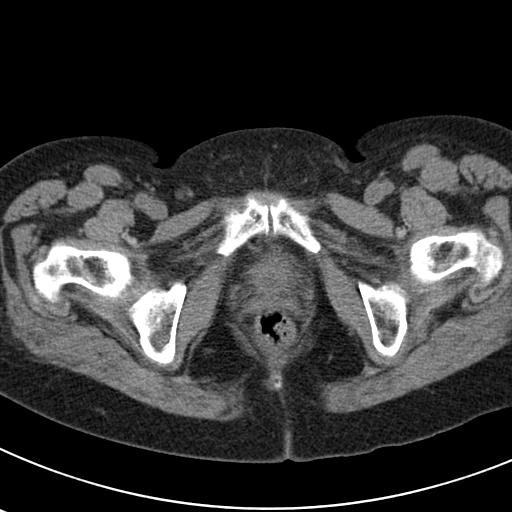
[im 19/95  soft-tissue]
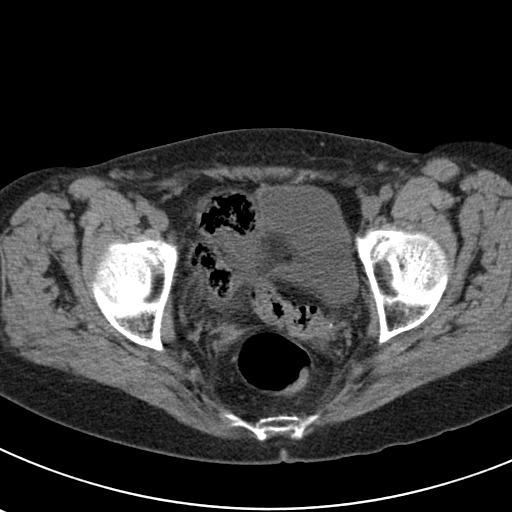
[im 26/95  soft-tissue]
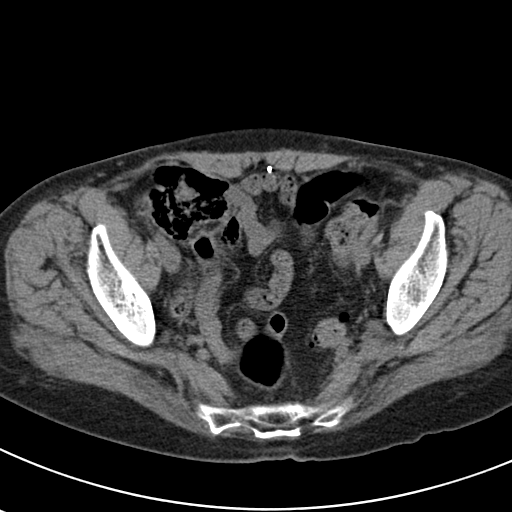
[im 33/95  soft-tissue]
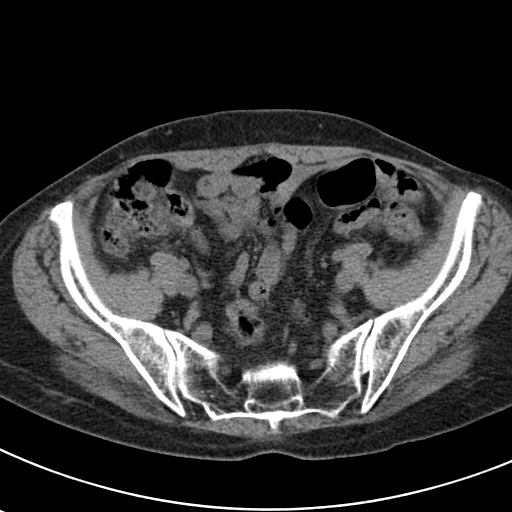
[im 37/95  soft-tissue]
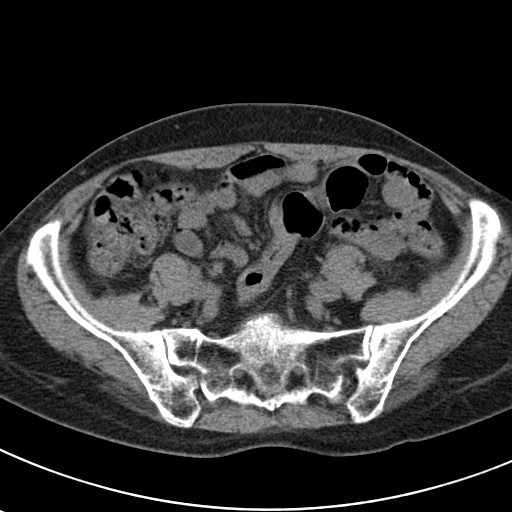
[im 44/95  soft-tissue]
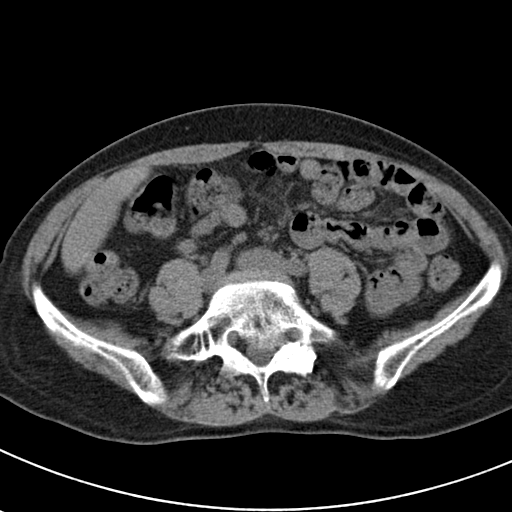
[im 51/95  soft-tissue]
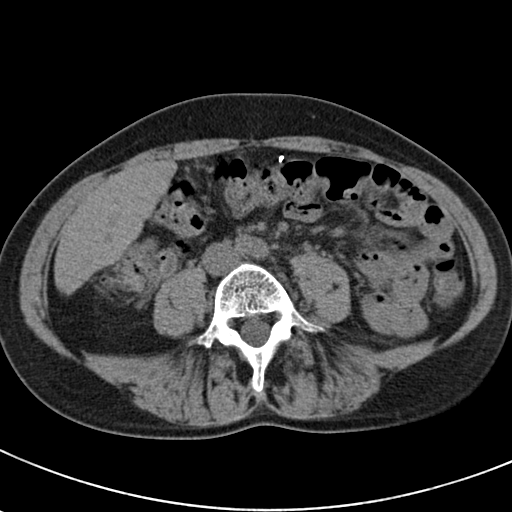
[im 58/95  soft-tissue]
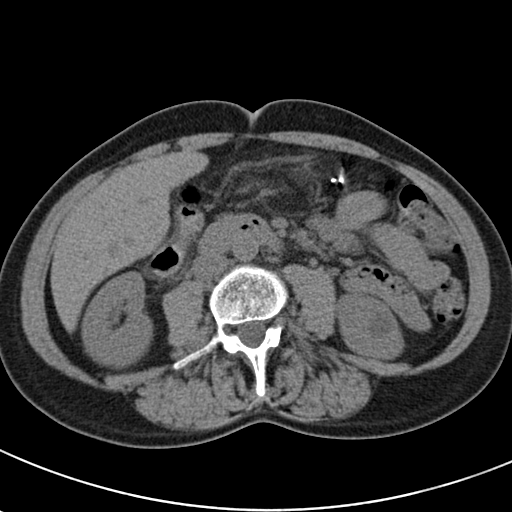
[im 58/95  bone]
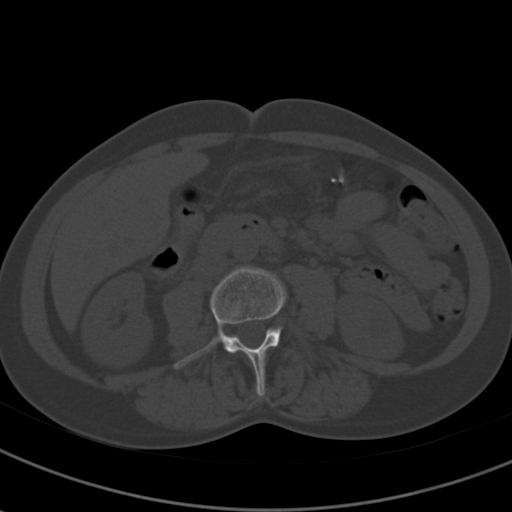
[im 62/95  soft-tissue]
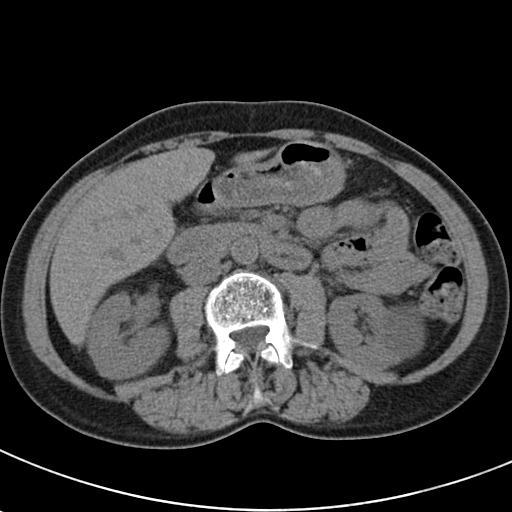
[im 69/95  soft-tissue]
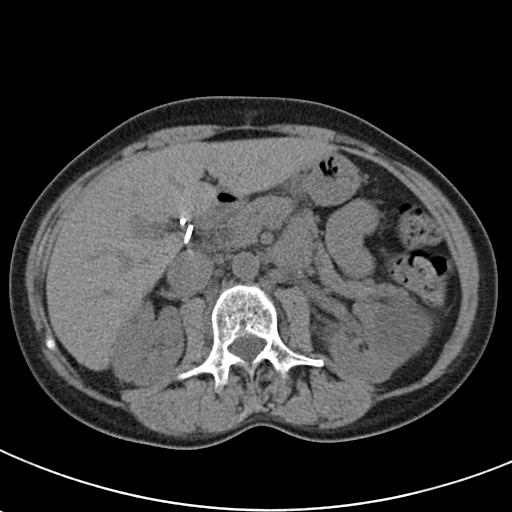
[im 76/95  soft-tissue]
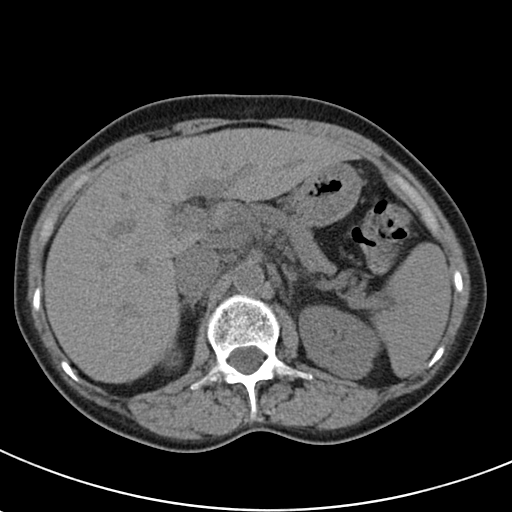
[im 84/95  soft-tissue]
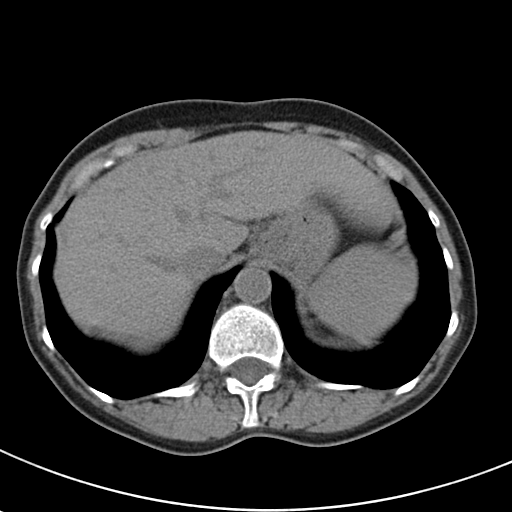
[im 91/95  soft-tissue]
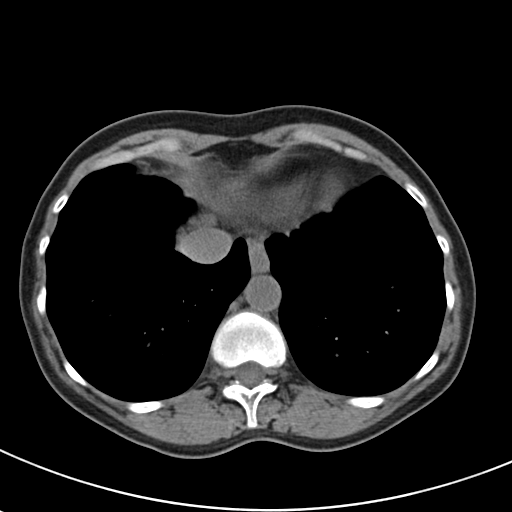

[Series 602: coronal abdomen · coronal · 0.77mm/px · 3 of 94 slices shown]
[im 32/94  soft-tissue]
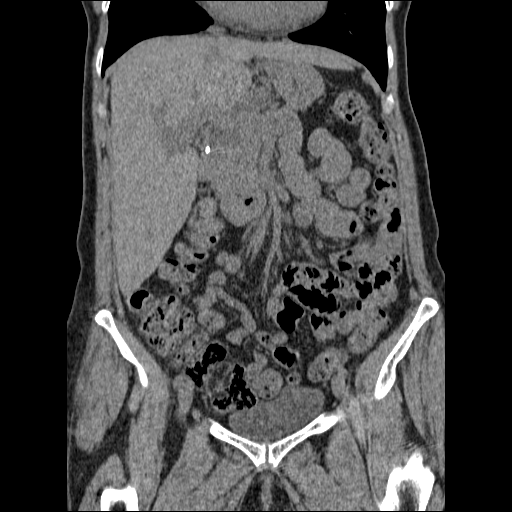
[im 42/94  soft-tissue]
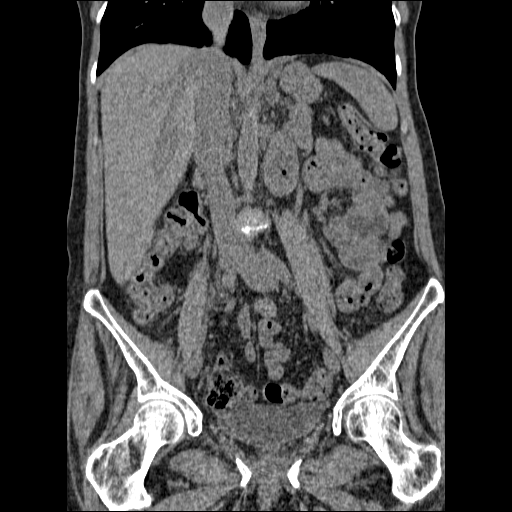
[im 52/94  soft-tissue]
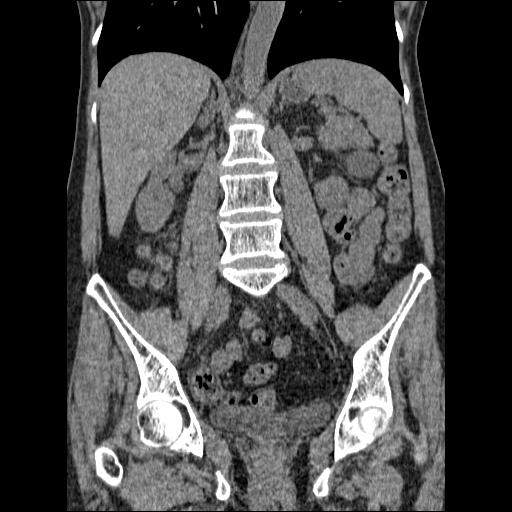

[17 of 46 positions shown; findings below may reference images not displayed]

FINDINGS: The lung bases are clear.  There is no pleural effusion.
No renal or ureteral calculi are demonstrated.  There is no
hydronephrosis or perinephric soft tissue stranding.  Bilateral
renal cysts are again noted.

As imaged in the noncontrast state, the liver, spleen, pancreas and
adrenal glands appear unremarkable.  The gallbladder is surgically
absent.  Duodenal and gastric fundal diverticula are again noted.
No inflammatory changes are evident.
IMPRESSION: 1.  No evidence of urinary tract calculus or hydronephrosis.
2.  No acute abdominal findings.
3.  Bilateral renal cysts.

CT PELVIS
FINDINGS: Distally, the ureters appear normal in caliber.  There
is no evidence of ureteral or bladder calculus.  No pelvic mass,
fluid collection or inflammatory process is demonstrated.  There
are surgical clips in the anterior aspect of the false pelvis.
IMPRESSION: 1.  No evidence of urinary tract calculus or hydronephrosis.
2.  No pelvic inflammatory changes demonstrated.

## 2009-09-22 ENCOUNTER — Emergency Department (HOSPITAL_COMMUNITY): Admission: EM | Admit: 2009-09-22 | Discharge: 2009-09-22 | Payer: Self-pay | Admitting: Emergency Medicine

## 2010-03-24 ENCOUNTER — Encounter: Payer: Self-pay | Admitting: Gastroenterology

## 2010-03-25 ENCOUNTER — Encounter: Payer: Self-pay | Admitting: Orthopedic Surgery

## 2010-03-25 ENCOUNTER — Encounter: Payer: Self-pay | Admitting: Gastroenterology

## 2010-05-19 LAB — POCT CARDIAC MARKERS
Myoglobin, poc: 94.3 ng/mL (ref 12–200)
Troponin i, poc: <0.05 ng/mL (ref 0.00–0.09)

## 2010-05-19 LAB — BASIC METABOLIC PANEL
CO2: 27 mEq/L (ref 19–32)
Calcium: 9.5 mg/dL (ref 8.4–10.5)
Chloride: 102 mEq/L (ref 96–112)
Creatinine, Ser: 0.79 mg/dL (ref 0.4–1.2)
GFR calc Af Amer: >60 mL/min (ref >60)
Glucose, Bld: 117 mg/dL — ABNORMAL HIGH (ref 70–99)

## 2010-05-19 LAB — DIFFERENTIAL
Basophils Relative: 1 % (ref 0–1)
Eosinophils Absolute: 0.1 10*3/uL (ref 0.0–0.7)
Eosinophils Relative: 4 % (ref 0–5)
Lymphs Abs: 1.6 10*3/uL (ref 0.7–4.0)
Monocytes Relative: 16 % — ABNORMAL HIGH (ref 3–12)

## 2010-05-19 LAB — CBC
MCH: 33.6 pg (ref 26.0–34.0)
MCHC: 34.9 g/dL (ref 30.0–36.0)
MCV: 96.1 fL (ref 78.0–100.0)
Platelets: 233 10*3/uL (ref 150–400)
RBC: 4.13 MIL/uL (ref 3.87–5.11)

## 2010-06-06 LAB — COMPREHENSIVE METABOLIC PANEL
ALT: 15 U/L (ref 0–35)
BUN: 18 mg/dL (ref 6–23)
CO2: 26 mEq/L (ref 19–32)
Calcium: 9.1 mg/dL (ref 8.4–10.5)
Creatinine, Ser: 0.73 mg/dL (ref 0.4–1.2)
GFR calc non Af Amer: >60 mL/min (ref >60)
Glucose, Bld: 117 mg/dL — ABNORMAL HIGH (ref 70–99)
Sodium: 136 mEq/L (ref 135–145)

## 2010-06-06 LAB — URINALYSIS, ROUTINE W REFLEX MICROSCOPIC
Ketones, ur: 15 mg/dL — AB
Leukocytes, UA: NEGATIVE
Nitrite: NEGATIVE
Protein, ur: 30 mg/dL — AB
Urobilinogen, UA: 1 mg/dL (ref 0.0–1.0)

## 2010-06-06 LAB — CBC
HCT: 41.9 % (ref 36.0–46.0)
Hemoglobin: 14.3 g/dL (ref 12.0–15.0)
MCHC: 34.2 g/dL (ref 30.0–36.0)
MCV: 96.2 fL (ref 78.0–100.0)
RBC: 4.36 MIL/uL (ref 3.87–5.11)

## 2010-06-06 LAB — DIFFERENTIAL
Eosinophils Relative: 0 % (ref 0–5)
Monocytes Absolute: 1.8 10*3/uL — ABNORMAL HIGH (ref 0.1–1.0)
Neutrophils Relative %: 66 % (ref 43–77)

## 2010-06-06 LAB — LIPASE, BLOOD: Lipase: 41 U/L (ref 11–59)

## 2010-07-17 NOTE — Consult Note (Signed)
NAME:  [name], [name] NO.:  [id number]   MEDICAL RECORD NO.:  [id number]          PATIENT TYPE:  INP   LOCATION:  4703                         FACILITY:  [facility]   PHYSICIAN:  Armanda Magic, M.D.     DATE OF BIRTH:  [birth year]   DATE OF CONSULTATION:  09/27/2007  DATE OF DISCHARGE:                                 CONSULTATION   REFERRING PHYSICIAN:  Fleet Contras, MD.   PRIMARY CARDIOLOGIST:  Lyn Records, MD   CHIEF COMPLAINT:  Chest pain.   HISTORY OF PRESENT ILLNESS:  This is a 60 year old white female with a  history of tobacco abuse, COPD, unspecified personality disorder, and  normal coronary arteries by cath in April 2007 who was admitted with  chest pain.  She awakened this morning with substernal chest pain across  both sides of her chest, but no radiation to the neck or arms.  The pain  is described as an aching sharp pain.  The patient complains of very  tender chest wall with movement or palpation and increased chest pain  with lying down.  Her chest pain is recreated with palpation of the  chest wall.  We are now asked to consult.   Past medical history includes normal coronary arteries by cath in April  2007, hypertension, COPD, hemorrhoids, tobacco abuse, unspecified  personality disorder, vitamin D deficiency, and anxiety.   PAST SURGICAL HISTORY:  Status post total abdominal hysterectomy, status  post cholecystectomy, and status post hemorrhoidectomy.   SOCIAL HISTORY:  She denies any alcohol use.  She was a former smoker.   FAMILY HISTORY:  Her mother had coronary artery disease with CABG.   REVIEW OF SYSTEMS:  Other than what is stated in the HPI is negative.   MEDICATIONS:  1. Aspirin 81 mg a day.  2. Clonazepam 0.5 mg p.r.n.  3. HCTZ 25 mg a day.  4. Vitamin B.   PHYSICAL EXAMINATION:  VITAL SIGNS:  Blood pressure is 133/87, pulse 86,  respirations 18, and O2 saturation is 99% on room air.  GENERAL:  This a well-developed and  well-nourished female, in no acute  distress.  HEENT:  Benign.  NECK:  Supple without lymphadenopathy.  Carotid upstrokes +2  bilaterally.  No bruits.  CHEST:  Lungs are clear to auscultation throughout.  Chest wall is  extremely tender to palpation over the sternum and both the right and  left sides of the chest, which completely recreates her chest pain.  HEART:  Regular rate and rhythm.  No murmurs, rubs, or gallops.  Normal  S1 and S2.  ABDOMEN:  Soft, nontender, and nondistended with active bowel sounds.  No hepatosplenomegaly.  EXTREMITIES:  No edema.   LABS:  Point-of-care markers negative x3.  CPK 43, MB 1.1, troponin less  than 0.01.  CBC is normal.  Sodium 140, potassium 4.7, chloride 103,  bicarb 29, BUN 10, creatinine 0.6.  EKG shows sinus rhythm at 83 beats  per minute with RSR prime in D1.  There are no ST changes.   ASSESSMENT:  1. Chest pain with negative  cardiac enzymes and no EKG changes.  Chest      pain is very atypical and consistent mainly with costochondritis.  2. Normal coronary arteries by cath in April 2007.  3. Anxiety.  4. Hypertension, stable.  5. Chronic obstructive pulmonary disease.  6. Personality disorder.   PLAN:  1. Check a 2D echocardiogram to rule out pericardial effusion.  Check      a D-dimer.  We will start Motrin 600 mg q.6 h. for chest wall pain,      Nexium 40 mg b.i.d. for GI prophylaxis.  Further workup per Dr.      Katrinka Blazing.  Possible outpatient stress test.      Armanda Magic, M.D.  Electronically Signed     TT/MEDQ  D:  09/27/2007  T:  09/27/2007  Job:  [id number]   cc:   Fleet Contras, M.D.  Lyn Records, M.D.

## 2010-07-17 NOTE — Op Note (Signed)
NAME:  [name], [name]                 ACCOUNT NO.:  [id number]   MEDICAL RECORD NO.:  [id number]          PATIENT TYPE:  AMB   LOCATION:  DAY                          FACILITY:  [facility]   PHYSICIAN:  Sigmund I. Patsi Sears, M.D.DATE OF BIRTH:  Jan 09, 1951   DATE OF PROCEDURE:  12/26/2006  DATE OF DISCHARGE:                               OPERATIVE REPORT   PREOPERATIVE DIAGNOSIS:  Right renal pelvic obstructing stone.   POSTOPERATIVE DIAGNOSIS:  Right renal pelvic obstructing stone.   OPERATION:  1. Cystourethroscopy.  2. Right retrograde pyelography.  3. Right double-J catheter insertion, 4.8 x 24 cm.   SURGEON:  Sigmund I. Patsi Sears, M.D.   ANESTHESIA:  General LMA.   PREPARATION:  After appropriate preanesthesia, the patient was brought  to the operating room, placed on the operating room in the dorsal supine  position, where general LMA anesthesia was introduced.  She was then  replaced in the dorsal lithotomy position, where the pubis was prepped  with Betadine solution and draped in the usual fashion.   REVIEW OF HISTORY:  This 60 year old female has a history of continued  right flank pain, which has remained unabated since October 2.  It is  accompanied by nausea.  The patient was unable to become comfortable  taking muscle relaxant medication and was seen in the [facility]  emergency room three times since September 11 with various urinary tract  problems.  CT scan shows a possible 3-mm stone in the right lower pole  infundibulum with obstruction.  She is now for cystoscopy, retrograde  pyelography and possible double-J catheter insertion.   PROCEDURE:  Cystourethroscopy was accomplished and shows a normal-  appearing bladder.  Right retrograde pyelogram was performed, which  shows normal-caliber ureter with no hydronephrosis and no stones and no  tumors.  There is a suggestion of a small stone in the right renal  pelvis, and it is elected to place double-J catheter.  A  4.8 x 24 cm  double-J catheter is then inserted over guidewire and coiled in the  renal pelvis and in the bladder.  The abnormality identified on  retrograde at the end of procedure is in the mid pole infundibulum,  which is wide now.  Double J is an excellent position.  The patient will  be observed for pain relief.  She was awakened and taken to the recovery  room in good condition.      Sigmund I. Patsi Sears, M.D.  Electronically Signed    SIT/MEDQ  D:  12/26/2006  T:  12/27/2006  Job:  [id number]

## 2010-07-17 NOTE — Consult Note (Signed)
NAME:  [name], [name]                 ACCOUNT NO.:  [id number]   MEDICAL RECORD NO.:  [id number]          PATIENT TYPE:  INP   LOCATION:  1443                         FACILITY:  [facility]   PHYSICIAN:  Sigmund I. Patsi Sears, M.D.DATE OF BIRTH:  1950/11/28   DATE OF CONSULTATION:  DATE OF DISCHARGE:                                 CONSULTATION   CHIEF COMPLAINT:  The patient is seen in the emergency room today with  complaint of severe right sided flank pain, lower abdominal pain, and  nausea and vomiting.   HISTORY OF PRESENT ILLNESS:  This is a 60 year old female that is well  known to our office that has presented to our office with distal right  ureteral stone and hydronephrosis.  The patient was originally scheduled  to have surgery by Dr. Patsi Sears for cystourethroscopy and retrograde  pyelogram basket extraction and possible double-J stent placement as an  outpatient at [facility] on March 09, 2007.  The patient is seen today  with complaint of severe colicky right flank pain and severe nausea and  vomiting.  The patient rates the pain as 8/10.  The symptoms have been  associated with nausea and vomiting.  There has not been any dysuria,  fever, or hematuria.  The patient has a significant history of medical  conditions similar symptoms previously recently evaluated.   REVIEW OF SYSTEMS:  Significant for nausea, vomiting, abdominal pain,  left flank pain, urgency, and frequency.  All other systems reviewed  with the patient and were negative.   PAST MEDICAL HISTORY:  1. Hypertension.  2. Kidney stones.   SOCIAL HISTORY:  Significant for denies ETOH, drug abuse.  She is a  current smoker of approximately 1 pack per day.   ALLERGIES:  1. COMPAZINE.  2. ZOFRAN.   HOME MEDICATIONS:  Clonazepam daily, hydrocodone p.r.n., Phenergan  p.r.n.   PHYSICAL EXAMINATION:  GENERAL:  Shows a well-developed, well-nourished,  white female in no acute distress.  HEENT:  Negative.  NECK:   Supple, nontender, no nodes.  HEART:  Regular rhythm and rate.  LUNGS:  Clear to P&A.  ABDOMEN:  Soft, nontender.  Positive bowel sounds.  No organomegaly or  masses.  GU:  Shows normal female genitalia with a Foley in the meatus.  EXTREMITIES:  Normal without clubbing or cyanosis.  NEUROLOGIC:  Intact.  The patient is alert and oriented and cooperative.  VITAL SIGNS:  Stable and the patient is afebrile.   LABORATORY:  Shows urine today WBCs are 3-6 per high-powered field, RBC  0-2, there is small bilirubin and a trace of leukocytes, and moderate  blood.  BNP today is normal.  BUN is 15, creatinine 0.7.  CBC is normal.  WBC is 4.4, RBC is 4.34.  The patient had a nuclear medicine renal scan  that showed no evidence of obstructive uropathy, with split renal  function equal to 50.2% from the left kidney and 49.8 from the right  kidney, dilated right renal collecting system consistent with  hydronephrosis without evidence for high grade obstruction.   ASSESSMENT:  Distal right renal ureteral stone  with hydronephrosis.   PLAN:  The patient will be taken to the OR this afternoon for  cystoscopy, ureteroscopy, retrograde pyelogram, basket extraction, and  possible double-J stent placement by Dr. Patsi Sears.      Jetta Lout, NP      Sigmund I. Patsi Sears, M.D.  Electronically Signed    DW/MEDQ  D:  03/02/2007  T:  03/02/2007  Job:  [id number]

## 2010-07-17 NOTE — Discharge Summary (Signed)
NAME:  [name], [name]                 ACCOUNT NO.:  [id number]   MEDICAL RECORD NO.:  [id number]          PATIENT TYPE:  INP   LOCATION:  1422                         [facility]ACILITY:  [facility]   PHYSICIAN:  Sigmund I. Patsi Sears, M.D.DATE OF BIRTH:  May 24, 1950   DATE OF ADMISSION:  06/02/2006  DATE OF DISCHARGE:  06/04/2006                               DISCHARGE SUMMARY   FINAL DIAGNOSES:  1. Flank pain.  2. Severe anxiety reaction.   OPERATIONS:  None.   HISTORY:  [name] is a 60 year old single female with a history of  left flank pain and gross hematuria.  She had bilateral ureteroscopy and  stone removal with left double-J catheter placed.  Because of an intense  left flank pain and severe nausea, the patient appeared in the emergency  room.  She was agitated and angry, demanded hospital admission, and  threatened suicide and also to kill her mother if she was not admitted  to the hospital for IV pain medications.  The patient finally agreed to  cystoscopy and removal of her double-J catheter, and this was  accomplished in the office prior to admission.  Patient was admitted for  IV antibiotics, pain control, and psychiatric consultation.  She has a  known psychiatric history but admits that she has been given  antihypertensive medications (Paxil) in the past, which she does not  take because I do not like to take pills.   Her past history is significant for hypertension treated with stent,  hypokalemia.   ALLERGIES:  ZOFRAN.   MEDICATIONS:  1. Vasotec 5 mg a day.  2. Aspirin.  3. Cipro.   TOBACCO:  Unknown.   FAMILY HISTORY:  As dictated in H&P on June 02, 2006.   REVIEW OF SYSTEMS:  As dictated in H&P on June 02, 2006.   PHYSICAL EXAMINATION:  As dictated in H&P on June 02, 2006.   HOSPITAL COURSE:  Patient was admitted for IV antibiotics and pain  control.  She had psychiatric consultation with a diagnosis of acute  anxiety disorder and was treated by the psychiatry  service.  She was  given outpatient psychiatric followup.  She was allowed to be discharged  on June 04, 2006, stable.  She will return to the office if she has  further problems.      Sigmund I. Patsi Sears, M.D.  Electronically Signed     SIT/MEDQ  D:  10/03/2006  T:  10/03/2006  Job:  [id number]

## 2010-07-17 NOTE — H&P (Signed)
NAME:  [name], [name]                 ACCOUNT NO.:  [id number]   MEDICAL RECORD NO.:  [id number]          PATIENT TYPE:  INP   LOCATION:  4703                         FACILITY:  [facility]   PHYSICIAN:  Fleet Contras, M.D.    DATE OF BIRTH:  [birth year]   DATE OF ADMISSION:  09/27/2007  DATE OF DISCHARGE:                              HISTORY & PHYSICAL   PRESENTING COMPLAINTS:  Central chest pain.   HISTORY OF PRESENT ILLNESS:  [name] is a 60 year old white lady with  past medical history significant for systemic hypertension,  hypothyroidism, gastroesophageal reflux disease, COPD, and vitamin D  deficiency.  She presented to the emergency room at [facility]  with 2 days history of central chest pain that comes on and off  intermittently.  It was mainly pressure-like and severe in nature,  nonradiating, associated with nausea but no vomiting.  She rated the  pain as 8/10, but got better with sublingual nitroglycerin at 3 doses.  She has had multiple presentations to the emergency room with chest pain  and was previously worked up by Dr. Verdis Prime about 2 years ago with  negative findings.  She denied having any cough, sputum production,  palpitations, orthopnea, PND, leg edema, or hemoptysis.  She had no  fevers or chills.  She has episodes of right-sided flank pain which she  thought was due to kidney stones, but she has had stent and stone  removal in the past performed by Dr. Patsi Sears.  She currently has no  abdominal pain, no diarrhea, or constipation.  She has no dysuria,  frequency, or hematuria.  She has been admitted to the hospital for  further cardiac workup in view of recurrent symptomatology.   PAST MEDICAL HISTORY:  Significant for,  1. Systemic hypertension.  2. Hypothyroidism.  3. Osteoporosis.  4. Gastroesophageal reflux disease.  5. External hemorrhoids.  6. Chronic obstructive pulmonary disease.  7. Deficiency of vitamin D.   MEDICATIONS:  1.  Hydrochlorothiazide 25 mg p.o. daily.  2. Clonazepam 0.5 mg p.o. daily p.r.n.  3. Phenergan 25 mg one p.o. q.6 p.r.n.  4. Aspirin 81 mg p.o. daily.  5. Ergocalciferol 50,000 units every week.  6. Boniva 150 mg every month.  7. Calcium carbonate with vitamin D one p.o. b.i.d.  8. Albuterol HFA 2 puffs q.6 p.r.n.  9. Anusol HC suppositories applied one rectally b.i.d. p.r.n.   ALLERGIES:  She is allergic to ZOFRAN, COMPAZINE, and ULTRAM.   FAMILY HISTORY AND SOCIAL HISTORY:  She is on disability.  She lives  with her mother.  She smokes 3 cigarettes per day.  She denies any use  of alcohol or illicit drugs.  Her father died of a stroke.  Her mother  has diabetes and heart disease as well as lupus.  She has 1 brother and  3 sisters in good health.  She has no children.   SURGICAL HISTORY:  She has had a hysterectomy.  She has also had a  cystoscopy, stent placement, and removal of stone from the right kidney.   REVIEW OF SYSTEMS:  Essentially as above.   PHYSICAL EXAMINATION:  GENERAL:  She is lying comfortably in the  hospital bed, not in acute respiratory or painful distress.  She is not  pale.  She is not icteric.  She is not cyanosed.  HEENT:  Atraumatic and normocephalic.  VITAL SIGNS:  Stable with a blood pressure of 138/84, heart rate is 85,  respiratory rate is 28, temperature 98, and O2 sats are 99% on room air.  CHEST:  Clear to auscultation.  HEART:  Heart sounds 1 and 2 are heard with no murmurs.  It is tender to  the anterior chest with pressure.  There is no obvious deformity or  swelling.  ABDOMEN:  Flat, soft, nontender, no masses.  Bowel sounds are present.  EXTREMITIES:  No edema, no calf tenderness, or swelling.  CNS:  She is alert and oriented x2 with no focal neurological deficits.   LABORATORY DATA:  Her point-of-care CPK and troponin are both within  normal limits.  EKG shows normal sinus rhythm with no acute ST-T wave  changes.  WBC is 5.2, hemoglobin 13.1,  hematocrit 38.9, and platelet  count is 240.  Sodium is 140, potassium 4.7, chloride 103, bicarbonate  of 29, glucose is 120, BUN is 10, and creatinine 0.66.  Liver function  tests essentially are within normal limits.  Albumin is 4.3, calcium  9.7, and lipase is 28.  Chest x-ray showed changes of COPD with no acute  infiltrates.   ASSESSMENT:  [name] is a 60 year old white female with multiple risk  factors for coronary artery disease including hypertension,  hypothyroidism, family history of coronary artery disease, and recurrent  chest pains presenting at this time with another episode of central  chest pain.  She had been admitted to the hospital for ruling out of  acute coronary syndrome and further workup.   ADMISSION DIAGNOSIS:  Central chest pain.   PLAN OF CARE:  She will be admitted to a telemetry bed.  She will be on  intravenous nitroglycerin for pain control and intravenous heparin for  unstable angina.  Cardiology consult will be requested from [facility].  Her home medication will be continued as above.  IV  morphine 0.5-1 mg q.4 h. p.r.n. for chest pain and IV Phenergan 25 mg  q.6 h. p.r.n. for nausea and vomiting.  Serial CPK and troponin q.8 h.  x3.  This plan of care has been discussed with her and her mother and  their questions answered.      Fleet Contras, M.D.  Electronically Signed     EA/MEDQ  D:  09/27/2007  T:  09/28/2007  Job:  [id number]

## 2010-07-17 NOTE — Op Note (Signed)
NAME:  [name], [name]                 ACCOUNT NO.:  [id number]   MEDICAL RECORD NO.:  [id number]          PATIENT TYPE:  AMB   LOCATION:  SDS                          FACILITY:  [facility]   PHYSICIAN:  Georgia Lopes, M.D.  DATE OF BIRTH:  1950-09-07   DATE OF PROCEDURE:  01/25/2008  DATE OF DISCHARGE:                               OPERATIVE REPORT   PREOPERATIVE DIAGNOSES:  1. Right submandibular salivary gland stone.  2. Maxillary right and left contour deformity.   POSTOPERATIVE DIAGNOSES:  1. Right submandibular salivary gland stone.  2. Maxillary right and left contour deformity.   PROCEDURE:  1. Removal of right submandibular gland stone via intraoral approach.  2. Right and left maxillary alveoloplasty.   SURGEON:  Georgia Lopes, MD   ANESTHESIA:  General nasal.  Dr. Jacklynn Bue attending.   PROCEDURE:  The patient was taken to the operating room and placed on  the table in a supine position.  General anesthesia was administered and  a nasal endotracheal tube was placed and secured.  The eyes were  lubricated and protected and the patient was draped for the surgical  procedure.  A throat pack was placed and 2% lidocaine with 1:100,000  epinephrine was infiltrated in the maxillary vestibular injection on the  right and left side and the palatal injection around the alveoloplasty  areas.  Then, a mandibular block was given on the right side and  infiltration in the submandibular sublingual area.  Then, sweetheart  retractor was used and a 1-0 silk was used to make a large suture for  traction in the proximal portion of the salivary gland duct.  The stone  was identified and dissection was carried down to the stone intraorally  using a #15 blade hemostats.  The duct was identified and stone palpated  within it.  Then, the duct was incised with a #15 blade and the stone  was removed and submitted for pathology.  Then, the duct was closed with  4-0 Vicryl.  The incision and the  tissues were closed with 4-0 chromic.  Then, attention was turned to the maxilla.  A #15 blade was used to make  a full-thickness incision over the crest of the maxillary ridge on the  left side from the molars to the incisor region.  The periosteum was  reflected with periosteal elevator and then an alveoloplasty bur was  used to smooth the bone and then a bone file was used there.  The area  was irrigated and closed with 3-0 chromic.  Similar procedure was  performed on the right side using a #15 blade, periosteal elevator  alveoplasty bur with copious irrigation, and a bone file.  After this  area was smooth, it was closed with 3-0 chromic and the oral cavity was  completely irrigated, suctioned, and the throat packing was removed.  The patient was awakened, taken to the recovery room, breathing  spontaneously in good condition.   ESTIMATED BLOOD LOSS:  Minimal.   COMPLICATIONS:  None.   SPECIMEN:  Right submandibular gland, salivary stone.  ______________________________  Georgia Lopes, M.D.     SMJ/MEDQ  D:  01/25/2008  T:  01/25/2008  Job:  [id number]

## 2010-07-17 NOTE — Op Note (Signed)
NAME:  [name], GREENLAW                 ACCOUNT NO.:  [id number]   MEDICAL RECORD NO.:  [id number]          PATIENT TYPE:  INP   LOCATION:  1443                         FACILITY:  [facility]   PHYSICIAN:  Sigmund I. Patsi Sears, M.D.DATE OF BIRTH:  Jun 29, 1950   DATE OF PROCEDURE:  03/02/2007  DATE OF DISCHARGE:                               OPERATIVE REPORT   PREOPERATIVE DIAGNOSIS:  Right ureterovesical junction stone.   POSTOPERATIVE DIAGNOSIS:  Right ureterovesical junction stone.   OPERATION:  1. Cystourethroscopy.  2. Right retrograde pyelogram with interpretation.  3. Balloon dilation of right lower ureter.  4. Ureteroscopy.  5. Basket extraction of right lower ureteral calculus.   SURGEON:  Sigmund I. Patsi Sears, M.D.   ANESTHESIA:  General LMA.   PREPARATION:  After appropriate preanesthesia, the patient was brought  to the operating room and placed on the operating room table in the  dorsal supine position, where general LMA anesthesia was induced.  She  was then replaced in the dorsal lithotomy position, where the pubis was  prepped with Betadine solution and draped in the usual fashion.   HISTORY:  This 60 year old female presented again today in the emergency  room with a right lower ureteral colic.  She was scheduled for surgery  next week but because of recurrence in the emergency room with  intolerance of her pain and inability to pass her 4-mm right lower  ureteral calculus, she is brought to the operating room tonight for  basket extraction.   PROCEDURE:  Cystourethroscopy was accomplished after general LMA  anesthesia was introduced and the patient placed in dorsal lithotomy  position.  Cystoscopy revealed normal-appearing bladder base and normal  orifices bilaterally.  Right retrograde pyelogram under magnification  revealed a stone in the right UV junction.  Although difficult to see, I  definitely felt that it was the same calcification seen on CT.  A  guidewire  was passed into the renal pelvis and balloon dilation was  accomplished of the intramural ureter.  Right ureteroscopy was then  accomplished,  the stone identified, basket-extracted after crushing part of the stone.  The stone was taken to the office to be sent for analysis.  The patient  was then awakened and taken to the recovery room in good condition.  She  received IV Toradol and B&O suppository at the end of the procedure.      Sigmund I. Patsi Sears, M.D.  Electronically Signed     SIT/MEDQ  D:  03/02/2007  T:  03/03/2007  Job:  [id number]

## 2010-07-20 NOTE — Op Note (Signed)
NAME:  [name], [name]                 ACCOUNT NO.:  [id number]   MEDICAL RECORD NO.:  [id number]          PATIENT TYPE:  AMB   LOCATION:  DAY                          FACILITY:  [facility]   PHYSICIAN:  Mark C. Vernie Ammons, M.D.  DATE OF BIRTH:  1950/04/13   DATE OF PROCEDURE:  05/29/2006  DATE OF DISCHARGE:                               OPERATIVE REPORT   PREOPERATIVE DIAGNOSIS:  Bilateral ureteral calculi.   POSTOPERATIVE DIAGNOSIS:  Bilateral ureteral calculi.   PROCEDURE:  Cystoscopy, bilateral ureteroscopy, bilateral stone  extraction, and left double-J stent placement.   SURGEON:  Mark C. Vernie Ammons, M.D.   ANESTHESIA:  General.   BLOOD LOSS:  None.   SPECIMENS:  Stone given to the patient.   DRAINS:  5-French 24 cm Polaris stent in the left ureter (no string).   COMPLICATIONS:  None.   INDICATIONS:  The patient is a 60 year old white female who had  presented to the emergency room two days ago with flank pain on the  right hand side that was severe and associated with nausea but no  vomiting.  She has a history of kidney stones.  She was found to have a  distal right ureteral calculus that was 3 mm in size and a 5 mm left  ureteral stone with no obstruction.  She reported today that she had  been having shaking chills at night but was found to be afebrile and her  urine in the emergency room was clear and the urine in the office had  only a few white cells, mainly red cells.  Because she had bilateral  stones, it was felt ureteroscopic extraction was indicated and we  discussed the risks, complications and alternatives.   DESCRIPTION OF OPERATION:  After informed consent, the patient was  brought to the major OR and placed on the table, administered general  anesthesia, then moved to the dorsal lithotomy position.  The genitalia  was sterilely prepped and draped and a 6 French rigid ureteroscope was  then passed into the bladder.  The bladder could be visualized and noted  to  be free of any tumor, stones or inflammatory lesions.  The left  orifice was then identified and noted to be fairly patulous and I was  able to advance the scope easily into the left orifice and visualize the  stone.  I grasped it with a nitinol basket and the stone broke into  multiple fragments.  The largest fragment, which was approximately 2 mm,  was extracted with a nitinol basket.  All the rest of the fragments were  smaller and flushed out of the ureter when I removed the ureteroscope.  I then reinserted the ureteroscope in the right ureter and found no  further stones present and I saw the fragments lying on the floor of the  bladder.   The left ureter was then identified with the ureteroscope and it was  passed in the left ureter without difficulty.  I passed it up to the  level of the stone, visualized the stone, and again, grasped it with a  nitinol basket.  It, also, was very friable and fragmented into small  pieces.  I grasped the smaller pieces with a nitinol basket and used  them to basically crush the stone, the largest piece, again, was about 2  mm and it was extracted with a nitinol basket.  The remaining fragments  were 1 mm or less and were felt to likely pass spontaneously.  I,  therefore, removed the ureteroscope.   The 21 French cystoscope was then inserted in the bladder and inspection  again revealed no abnormality.  The left orifice was identified and a  0.03 inch floppy tip guidewire was then passed up the left ureter under  direct fluoroscopic control.  I then passed the Polaris stent over the  guidewire with a good curl being noted in the left kidney left renal  pelvic region.  I removed the guidewire and then drained the bladder and  removed the cystoscope.  The patient was awakened and taken to the  recovery room in stable satisfactory condition.  She tolerated the  procedure well with no intraoperative complications.  It did not appear  this patient had  an infection, but I am going to cover her with Cipro  750 mg b.i.d., #10, and also gave her Pyridium Plus, #36.  She will  follow-up in my office in five days for stent removal.  She will be  discharged from the recovery room when fully recovered.      Mark C. Vernie Ammons, M.D.  Electronically Signed     MCO/MEDQ  D:  05/29/2006  T:  05/29/2006  Job:  [id number]

## 2010-07-20 NOTE — H&P (Signed)
NAME:  [name], [name]                 ACCOUNT NO.:  [id number]   MEDICAL RECORD NO.:  [id number]          PATIENT TYPE:  INP   LOCATION:  0101                         FACILITY:  [facility]   PHYSICIAN:  Kela Millin, M.D.DATE OF BIRTH:  Jun 18, 1950   DATE OF ADMISSION:  10/10/2005  DATE OF DISCHARGE:                                HISTORY & PHYSICAL   PRIMARY CARE PHYSICIAN:  Ladell Pier, M.D.   CHIEF COMPLAINT:  Shortness of breath and worsening cough.   HISTORY OF PRESENT ILLNESS:  The patient is a 60 year old white female with  a past medical history significant for COPD with continued tobacco abuse,  GERD, and hypertension who presents with the above complaints.  She states  that she has had a cough productive of greenish sputum for the past week and  today she began having shortness of breath as well.  She reports that about  a week ago she was started on prednisone by her primary care physician, but  her symptoms have worsened, as discussed.  She admits to pleuritic chest  pain.  She denies PND, orthopnea, leg swelling, fevers, dysuria, melena or  hematochezia.  She states that she has longstanding nausea, but denies  vomiting.   The patient was seen in the ER and a chest x-ray showed findings consistent  with COPD/emphysema, but no acute infiltrates.  The patient was also noted  to have electrolyte abnormalities.  Following nebulized bronchodilators and  Solu-Medrol, she was still symptomatic and, so, she is admitted for further  evaluation and management.   PAST MEDICAL HISTORY:  1. As stated above.  2. History of osteoporosis.   CURRENT MEDICATIONS:  1. Prednisone 20 mg daily for the past week.  2. Norvasc 5 mg daily.  3. Paxil 40 mg q.h.s.  4. Phenergan 25 mg p.r.n.  5. Albuterol two puffs q.4h.  6. Protonix 40 mg daily.  7. Aspirin 81 mg daily.   ALLERGIES:  ZOFRAN   SOCIAL HISTORY:  Positive for tobacco.  She denies alcohol.  Also denies  illicit drug  use.   FAMILY HISTORY:  Her mother had coronary artery disease.  Her father had a  stroke.   REVIEW OF SYSTEMS:  As per History of Present Illness.  All other review of  systems negative.   PHYSICAL EXAMINATION:  GENERAL:  The patient is an older white female, alert  and oriented, in no respiratory distress with nasal cannula oxygen on.  VITAL SIGNS:  Temperature 97.9, blood pressure 112/63, pulse 110 (initially  102), respiratory rate 20, O2 saturation 99%.  HEENT:  PERRL.  EOMI.  Sclerae anicteric.  Moist mucous membranes.  No oral  exudates.  NECK:  Supple.  No adenopathy.  No JVD.  No thyromegaly.  LUNGS:  Decreased air movement bilaterally. scant wheeze.  No crackles.  HEART:  Tachycardic.  Normal S1/S2.  Regular rhythm.  No S3 appreciated.  ABDOMEN:  Soft.  Bowel sounds present.  Nontender, nondistended.  No  organomegaly or masses palpable.  EXTREMITIES:  Trace edema.  No cyanosis.  NEUROLOGIC:  Alert and oriented times  three.  Cranial nerves II-XII grossly  intact.  Nonfocal exam.   LABORATORY DATA:  Chest x-ray:  Findings are consistent with COPD/emphysema.  No acute infiltrates.  Her sodium is 141, potassium 2.9, chloride 103, CO2  30, glucose 161, BUN 13, creatinine 0.8, magnesium 1.9.  Beta-natriuretic  peptide 39.6.  White blood cell count is 7, hemoglobin 11.6, hematocrit  33.1, platelet count 281.   ASSESSMENT & PLAN:  1. Chronic obstructive pulmonary disease exacerbation with continued      tobacco abuse.  We will place the patient on nebulized bronchodilators.      Add Advair and antibiotics.  Place on increased dose of prednisone and      taper.  Also add antitussive.  Obtain a set of cardiac enzymes and EKG.      and follow.  2. Hypokalemia:  Replace potassium.  3. Hypertension:  Continue Norvasc.  4. Gastroesophageal reflux disease:  Continue proton pump inhibitor,      increase to b.i.d.  5. Depression:  Continue Paxil.      Kela Millin, M.D.   Electronically Signed     ACV/MEDQ  D:  10/11/2005  T:  10/11/2005  Job:  [id number]   cc:   Ladell Pier, M.D.  Fax: [phone]

## 2010-07-20 NOTE — H&P (Signed)
NAME:  [name] NO.:  [id number]   MEDICAL RECORD NO.:  [id number]          PATIENT TYPE:  INP   LOCATION:  1830                         FACILITY:  [facility]   PHYSICIAN:  Ulyses Amor, MD DATE OF BIRTH:  1950-06-29   DATE OF ADMISSION:  06/19/2005  DATE OF DISCHARGE:                                HISTORY & PHYSICAL   HISTORY OF PRESENT ILLNESS:  [name] is a 60 year old white woman who is  again admitted to [facility] for further evaluation of chest pain.   The patient has a long history of chronic chest pain.  She has been seen in  the emergency department on multiple occasions, including twice in the last  24 hours.  She was last hospitalized in February of this year.  She has also  been seen by Dr. Katrinka Blazing in the office.  During her last hospitalization, she  underwent a stress Cardiolite test, which was normal.  The patient reports  having chest pain every day, lasting nearly all day.  The chest pain is  described as a sharp and pressure discomfort in the left anterior chest.  It  radiates to the left arm.  It is associated with dyspnea and nausea.  There  are no exacerbating or ameliorating factors.  It appears not to be related  to position, activity, meals or respirations.  She takes several  nitroglycerin tablets each day; they result in no clear improvement in her  symptoms.  She experienced her usual chest pain today, but felt that it was  somewhat more intense than usual, and, hence, prompted her to visit the  emergency department.  Her chest pain continues at this time.  At times in  the past, her chest pain has radiated down both arms, down both legs, and up  to her neck.   The patient has no documented history of cardiac disease, including no  history of myocardial infarction, coronary artery disease, congestive heart  failure, or arrhythmia.  She has no history of hypertension, dyslipidemia or  diabetes mellitus.  She smokes  3-4 cigarettes a day.  There is a family  history of coronary artery disease (mother).   The patient has a history of several psychiatric diagnoses including  depression.   PAST MEDICAL HISTORY:  Otherwise unremarkable.   ALLERGIES:  ZOFRAN.   MEDICATIONS:  1.  Ambien.  2.  Aspirin.  3.  Calcium acetate.  4.  Clonazepam.  5.  Nitroglycerin.   PAST SURGICAL HISTORY:  1.  Hysterectomy.  2.  Sinus surgery.  3.  Right cataract surgery.  4.  Cholecystectomy.   SIGNIFICANT INJURIES:  None.   FAMILY HISTORY:  Coronary artery disease in her mother and stroke in her  father.   SOCIAL HISTORY:  The patient lives with her mother.  She does not work.  She  does not drink alcohol.  She does not use illicit drugs.   REVIEW OF SYSTEMS:  No new problems related to her head, eyes, ears, nose,  mouth, throat, lungs, gastrointestinal system, genitourinary system or  extremities.  There is no history of neurologic or psychiatric disorder.  There is no history of fever, chills or weight loss.   PHYSICAL EXAMINATION:  VITAL SIGNS:  Blood pressure of 154/95, pulse 91 and  regular, respirations 23, temperature 98.0.  GENERAL:  The patient was a middle-aged white woman in no discomfort.  She  was alert, oriented, appropriate and responsive.  HEENT:  Head, eyes, nose and mouth were normal.  NECK:  Without thyromegaly or adenopathy.  Carotid pulses were palpable  bilaterally and without bruits.  CARDIAC:  A normal S1 and S2.  There was no S3, S4, murmur, rub or click.  Cardiac rhythm was regular.  No chest wall tenderness was noted.  LUNGS:  The lungs were clear.  ABDOMEN:  Soft and nontender.  There was no mass, hepatosplenomegaly, bruit,  distention, rebound, guarding or rigidity.  Bowel sounds were normal.  BREASTS/PELVIC/RECTAL:  Not performed, as they were not pertinent to the  reason for acute care hospitalization.  EXTREMITIES:  Without edema, deviation or deformity.  Radial and  dorsalis  pedis pulses were palpable bilaterally.  Brief screening neurologic survey  was unremarkable.   The electrocardiogram revealed normal sinus rhythm.  There was an RSR or QR  pattern in V1.  The tracing was otherwise normal.  There were no  repolarization abnormalities specific for ischemia or infarction.  The chest  radiograph report was pending at the time of this dictation.  The initial  set of cardiac markers revealed a myoglobin of 35.0, CK-MB less than 1.0,  and troponin less than 0.05.  The remaining studies were pending at the time  of this dictation.   IMPRESSION:  1.  Chronic chest pain syndrome.  Two emergency department visits today.  A      stress Cardiolite on April 04, 2005 was normal.  The patient      experiences chest pain every day, nearly all day.  2.  Depression and multiple psychiatric diagnoses.   PLAN:  1.  Telemetry.  2.  Serial cardiac enzymes.  3.  Aspirin.  4.  Intravenous nitroglycerin.  5.  Intravenous heparin.  6.  Further measures per Dr. Katrinka Blazing.      Ulyses Amor, MD  Electronically Signed     MSC/MEDQ  D:  06/20/2005  T:  06/20/2005  Job:  [id number]   cc:   Lyn Records, M.D.  Fax: [phone]

## 2010-07-20 NOTE — Procedures (Signed)
[facility]  Patient:    [name], [name]                          MRN: [id number] Proc. Date: 09/12/99 Adm. Date:  [id number] Attending:  Charna Elizabeth CC:         Huey Bienenstock McDiarmid, M.D.                           Procedure Report  DATE OF BIRTH:  11/15/1950  REFERRING PHYSICIAN:  Huey Bienenstock McDiarmid, M.D.  PROCEDURE PERFORMED:  Colonoscopy.  ENDOSCOPIST:  Anselmo Rod, M.D.  INSTRUMENT USED:  Olympus video colonoscope.  INDICATIONS FOR PROCEDURE:  Intermittent rectal bleeding and family history of colon cancer in a 60 year old white female with abdominal pain, rule out colonic polyps, masses, hemorrhoids, etc.  PREPROCEDURE PREPARATION:  Informed consent was procured from the patient. The patient was fasted for eight hours prior to the procedure, and prepped with a bottle of magnesium citrate, and a gallon of NuLytely the night prior to the procedure.  PREPROCEDURE PHYSICAL:  VITAL SIGNS:  Stable.  NECK:  Supple.  CHEST:  Clear to auscultation, S1 and S2 regular.  ABDOMEN:  Soft with normal abdominal bowel sounds.  DESCRIPTION OF PROCEDURE:  The patient was placed in the left lateral decubitus position, and sedated with 100 mg of Demerol and 10 mg of Versed intravenously.  Once the patient was adequately sedated, maintained in low flow oxygen and continuous cardiac monitoring, the Olympus video colonoscope was advanced from the rectum to the cecum with slight difficulty secondary to some residual stool in the right colon _______.  The entire colonic mucosa, the right, left, and transverse colon appeared healthy without any masses, polyps, erosions, ulcerations, or diverticula.  The cecum had a large amount of stool, and it is possible a very small lesion may have been missed.  The patient had small internal hemorrhoids seen on retroflexion in the rectum and tolerated the procedure well without complications.  IMPRESSION: 1.  Essentially normal colonoscopy except for small internal hemorrhoids. 2. Cecal base not clearly visualized.  RECOMMENDATIONS: 1. The patient has been advised to increase the fluid and fiber in her diet to    maintain bowel regularity. 2. Repeat colonoscopy has been recommended in the next five years or earlier    considering her family history of colon cancer. DD:  09/12/99 TD:  09/12/99 Job: [id number]

## 2010-07-20 NOTE — Consult Note (Signed)
NAME:  [name], [name]                 ACCOUNT NO.:  [id number]   MEDICAL RECORD NO.:  [id number]          PATIENT TYPE:  INP   LOCATION:  1422                         FACILITY:  [facility]   PHYSICIAN:  Antonietta Breach, M.D.  DATE OF BIRTH:  [birth year]   DATE OF CONSULTATION:  06/03/2006  DATE OF DISCHARGE:                                 CONSULTATION   REASON FOR CONSULTATION:  Destructive thoughts towards self and others,  extreme irritability.   HISTORY OF PRESENT ILLNESS:  Ms. [name] is a 60 year old female  admitted to the [facility] on June 02, 2006, for colicky  flank pain.   The patient recently underwent removal of a ureter stent and has had  severe pain.  She became extremely irritable on the day of admission  regarding the degree of her pain.  It is reported in the record that she  threatened to take her own life and her mother's if her pain was not  treated in the hospital.   After coming into the general medical ward and receiving pain treatment,  the patient is calm and cooperative.  She is not having any  hallucinations or delusions.  She is not having any thoughts of harming  herself or others.  Her interests are normal.  Her hope is intact.   She acknowledges that she has ongoing feeling on edge, muscle tension,  excess worry and insomnia.  She is on a morphine pump currently.  Her  memory is intact.  She is oriented to all spheres.   PAST PSYCHIATRIC HISTORY:  Ms. [name] describes a long-term history of  excessive worry, feeling on edge, muscle tension and insomnia.  In the  past, she states that Paxil relieved these symptoms.  However, it was  too sedative, and she stopped it.   She has been treated with Xanax, Klonopin, Librax and Ambien.   She has no history of suicide attempts.  She has had a number of  psychiatric admissions.  Her last one was in the 1990s for severe  anxiety.  She also describes a history of at least two major depressive  episodes.  She denies any history of hallucinations.   FAMILY PSYCHIATRIC HISTORY:  None known.   SOCIAL HISTORY:  Mrs. [name] is single.  Occupation:  Medically retired,  disabled.  No alcohol or illegal drug use.  Religion:  Holiness.   The patient lives with her mother.  There is no abuse in the home.  Both  the patient and the mother described their relationship as mutually  supportive.   The patient has been divorced for 22 years.  She has no children.   GENERAL MEDICAL PROBLEMS:  1. History of nephrolithiasis, multiple lithotripsies, multiple      painful passages of kidney stones with several emergency room      visits.  She has recently undergone a stone removal through      ureteroscopy which was bilateral on March 27.  She had a left      double-J catheter put in place.  She had removal of this catheter  on the date of admission.  2. COPD.  3. Hypertension.  4. Osteoporosis.   MEDICATIONS:  MAR is reviewed.  The patient is not on any current  psychotropic medication.  However, as mentioned, she is on a morphine  sulfate q.4 h, PCA.  Her pain is currently well-controlled.   ALLERGIES:  CODEINE CAUSES ITCHING.  SHE ALSO HAS AN ALLERGY TO ZOFRAN   LABORATORY DATA:  WBC 5.6, hemoglobin 12.9, platelet count 303.  The  metabolic panel shows the BUN at 10, creatinine 0.58, bilirubin 0.9,  SGOT 35, SGPT 19, albumin 3.7, calcium 9.4.   A head CT on May 13, 2006, showed no acute intracranial abnormality.  There was chronic small vessel white matter disease.  The CT was taken  for the chief complaint of weakness and dizziness.   REVIEW OF SYSTEMS:  CONSTITUTIONAL:  Afebrile.  HEAD:  No trauma.  EYES:  No visual changes.  EARS:  No hearing impairment NOSE:  No rhinorrhea.  MOUTH/THROAT:  No sore throat.  NEUROLOGIC:  As above.  PSYCHIATRIC:  As  above.  CARDIOVASCULAR:  No chest pain, palpitations or edema.  RESPIRATORY:  No coughing or wheezing.  GASTROINTESTINAL:  No  nausea,  vomiting or diarrhea.  GENITOURINARY:  No additional findings other than  above.  SKIN:  Unremarkable.  MUSCULOSKELETAL:  As above.  The patient  has osteoporosis.  HEMATOLOGIC/LYMPHATIC:  Unremarkable.  ENDOCRINE/METABOLIC:  Unremarkable.   PHYSICAL EXAMINATION:  VITAL SIGNS:  Temperature 98.0, pulse 84,  respirations 18, blood pressure 125/87, O2 saturation on 1 liter is 95%.   MENTAL STATUS EXAM:  Ms. [name] is a middle-aged female appearing her  chronologic age, partially reclined in a supine position in her hospital  bed with good eye contact and her nasal cannula in place.  Her affect is  slightly anxious at baseline with a broad appropriate response.  Her  mood is within normal limits.  She is oriented completely to all  spheres.  Her memory is intact to immediate recent and remote.  Her fund  of knowledge and intelligence are within normal limits.  Her speech  involves normal rate and prosody without dysarthria.  Thought process  logical, coherent, goal-directed.  No looseness of associations.  Thought content no thoughts of harming herself, no thoughts of harming  others, no delusions, no hallucinations.  Concentration is grossly  within normal limits.  Insight is partial.  Judgment is intact.  The  patient is well groomed.  She is socially appropriate and cooperative  with the interview.  She has good eye contact.   ASSESSMENT:  AXIS I:  (293.84)  Anxiety disorder not otherwise  specified, rule out generalized anxiety disorder.  However, there are  some general medical factors.  Rule out major depressive disorder, recurrent in partial remission.  AXIS II:  Deferred.  AXIS III:  See general medical problems.  AXIS IV:  General medical.  AXIS V:  55.   Ms. [name] is not at risk to harm herself or others.  She agrees to call  emergency services immediately for any thoughts of harming herself,  thoughts of harming others or distress.  At the patient's request, the  patient's mother attended the session to  facilitate education and support.   The undersigned provided ego supportive psychotherapy and education.   The indications, alternatives and adverse effects of the following were  discussed with the patient:  Benzodiazepines including Klonopin and  Xanax for anti-acute anxiety including the risk of dependence;  Ambien  for insomnia including the risk of dependence; Celexa for antidepression  antianxiety.   The risk of drowsiness memory and cognitive impairment with  benzodiazepines and Ambien was also discussed as well as the long-term  goal of trying to eliminate these agents.   The patient understands above information and wants to proceed with the  plan as below.   RECOMMENDATIONS:  1. Start Celexa at 5 mg p.o. q.a.m. as the initial conservative dosage      for antidepression/antianxiety.   Then increase as tolerated by 5 mg q.a.m. to the initial trial dose of  20 mg p.o. q.a.m.   Regarding the patient's Xanax dosing, would avoid using multiple  benzodiazepines and would just proceed with Xanax 0.25-0.5 mg p.o.  t.i.d. p.r.n. anxiety, with the goal of eventually eliminating its use  as the Celexa and psychotherapy take effect.   1. Would ask the social worker to set this patient up with an      outpatient psychiatric appointment at one of the clinics assigned      to [facility], [facility] or [facility].   This outpatient care would ideally include a psychiatric assignment for  medication management as well as a psychotherapist for cognitive  behavioral therapy, deep breathing and progressive muscle relaxation  training.   DISCUSSION:  Narcotic conversion to morphine within the central nervous  system can be significantly reduced with Paxil.      Antonietta Breach, M.D.  Electronically Signed     JW/MEDQ  D:  06/03/2006  T:  06/03/2006  Job:  [id number]

## 2010-07-20 NOTE — Consult Note (Signed)
NAME:  [name], [name] NO.:  [id number]   MEDICAL RECORD NO.:  [id number]          PATIENT TYPE:  EMS   LOCATION:  MAJO                         FACILITY:  [facility]   PHYSICIAN:  Shirley Friar, MDDATE OF BIRTH:  Nov 26, 1950   DATE OF CONSULTATION:  DATE OF DISCHARGE:  06/22/2005                                   CONSULTATION   REASON FOR CONSULTATION:  Black stools, abdominal pain.   HISTORY OF PRESENT ILLNESS:  [name] is a 60 year old white female who  called me tonight regarding onset of one episode of black stool, abdominal  pain and swelling.  She said her abdominal pain has been tight today, and  she had black tarry stool which she has never had before.  She denied any  nausea, vomiting, lightheadedness, NSAID use or bright red blood per rectum.  She was just in the emergency room on June 20, 2005, secondary to chronic  chest pain and a heart cath done which was negative.  At that time, her  hemoglobin was 11.7 and BUN was 7.  I requested that she come into the  emergency room to have abdominal pain and black stool episode reevaluated.  Upon arrival, she was hemodynamically stable with pulse 105, blood pressure  137/99.  She reports that prior to arrival and after our conversation, she  had another episode of black stools.  Her labs are pending at the time of  this dictation.   PAST MEDICAL HISTORY:  1.  Hypertension.  2.  Diabetes mellitus.  3.  Hyperlipidemia.  4.  Chronic chest pain.  5.  Depression.  6.  Psychiatric diagnosis (specifics not known at this time though reported      in chart history).  7.  History of emphysema.  8.  Hypothyroidism.  9.  Osteoporosis.   MEDICATIONS ON ADMISSION:  1.  Ambien.  2.  Aspirin.  3.  Calcium.  4.  Clonazepam.  5.  Nitroglycerin.  6.  Multivitamin.   ALLERGIES:  ZOFRAN.   REVIEW OF SYSTEMS:  As above.   PHYSICAL EXAMINATION:  VITAL SIGNS:  Temperature 97.5, pulse 105, blood  pressure 137/99,  respirations 18, O2 saturation 98% on room air.  GENERAL APPEARANCE:  Alert and in no acute distress.  Reading a book when I  entered a room.  HEENT:  Nonicteric sclerae.  CHEST:  Clear to auscultation anteriorly.  CARDIOVASCULAR:  Regular rate and rhythm without murmurs.  ABDOMEN:  Mildly distended, significant tenderness throughout with guarding.  No rebound.  Soft, no masses palpated.  EXTREMITIES:  No edema.  RECTAL:  Normal rectal tone.  No fissures.  No external hemorrhoids.  No  stool in vault.  No blood seen on glove finger.   LABORATORY DATA:  Labs are pending.   IMPRESSION:  A 60 year old white female with self reported episode of black  stool x2, abdominal pain and abdominal distention.  Hemodynamics are stable,  and I doubt she is having an active upper GI bleed.  However, with her self  reported history of black stool and abdominal pain, this  needed to be  further evaluated for a possible ulcer.  We will plan on doing a chest x-ray  to rule out free air, CBC and B-met to denote any changes in the setting of  a possible GI bleed.  At this time, I think an active bleed is very low  based on her past history of frequent admissions and frequent presentations  to the emergency room.  However, I will be checking abdominal CT to rule out  obstruction, and if she is anemic, then may need to pursue inpatient  endoscopy.  Otherwise, we will plan on doing an outpatient endoscopy if her  workup in the emergency room is negative.  We will await her studies prior  to decision on disposition.  If the patient needs to be admitted, the  patient will need to be admitted to Internal Medicine Service, and we will  be happy to follow in consultation.      Shirley Friar, MD  Electronically Signed     VCS/MEDQ  D:  06/21/2005  T:  06/24/2005  Job:  [id number]   cc:   Ladell Pier, M.D.  Fax: [phone]

## 2010-07-20 NOTE — H&P (Signed)
NAME:  [name], [name] NO.:  [id number]   MEDICAL RECORD NO.:  [id number]          PATIENT TYPE:  INP   LOCATION:  1824                         FACILITY:  [facility]   PHYSICIAN:  Ulyses Amor, MD DATE OF BIRTH:  1950/12/14   DATE OF ADMISSION:  04/04/2005  DATE OF DISCHARGE:                                HISTORY & PHYSICAL   HISTORY OF PRESENT ILLNESS:  [name] is a 60 year old white woman who  was admitted to [facility] for further evaluation of chest pain.   The patient has a history of chronic chest pain and has been seen in the  emergency department on multiple occasions.  She has been also seen in the  office by Dr. Katrinka Blazing, but no diagnostic tests have yet been performed.  The  patient presented to the emergency department after experiencing chest pain  throughout the course of the day.  This began in the morning.  She initially  experienced episodes lasting 20-30 minutes which resolved spontaneously only  to recur several minutes later.  This evening, her chest pain became more  intense and lasted longer than the 20-30 minutes.  This prompted her visit  to the emergency department.  The chest pain is described as a heaviness  across her anterior chest.  It radiates down both arms, down both legs, and  to her neck.  It is associated with mild dyspnea and nausea, but no  diaphoresis.  There are no exacerbating or ameliorating factors.  It appears  not to be related to position, activity, meals, or respirations.  Nitroglycerin taken today did not seem to effect her chest pain.  Though her  chest pain has improved somewhat since her arrival in the emergency  department, it has not resolved.   The patient has no history of hypertension, dyslipidemia, or diabetes  mellitus.  She smokes 3-4 cigarettes a day.  Her mother suffers from  coronary artery disease.   The patient has a history of several psychiatric diagnoses including  depression.  She  is on a number of medications with these problems including  Paroxetine, Clonazepam, Alprazolam, and Librax.   ALLERGIES:  ZOFRAN.   MEDICATIONS:  1.  Paroxetine.  2.  Ambien.  3.  Clonazepam.  4.  Alprazolam.  5.  Librax.  6.  Nitroglycerin.   PAST SURGICAL HISTORY:  Hysterectomy, sinus surgery, right cataract, and  cholecystectomy.   PAST MEDICAL HISTORY:  Significant injuries; none.   FAMILY HISTORY:  Coronary artery disease in her mother and stroke in her  father.   REVIEW OF SYSTEMS:  Reveals no new problems related to her head, eyes, ears,  mouth, nose, throat, lungs, gastrointestinal system, genitourinary system,  or extremities.  There is no history of neurologic disorder. There is no  history of fever, chills, or weight loss.   PHYSICAL EXAMINATION:  VITAL SIGNS:  Blood pressure 136/82, pulse 80 and  regular, respirations 24, temperature 98.9.  Pulse oximetry 100% on 2  liters.  GENERAL:  The patient was a white woman appearing older than her stated age.  She was alert, oriented, appropriate, and responsive.  HEENT:  Normal.  NECK:  Without thyromegaly or adenopathy.  Carotid pulses were palpable  bilaterally and without bruits.  CARDIOVASCULAR:  Normal S1 and S2.  There was no S3, S4, murmur, rub, or  click.  Cardiac rhythm was regular.  No chest wall tenderness was noted.  LUNGS:  Clear.  ABDOMEN:  Soft and nontender.  There was no mass, hepatosplenomegaly, bruit,  distention, rebound, guarding, or rigidity.  Bowel sounds were normal.  BREASTS:  PELVIC:  RECTAL:  Not performed as they were not pertinent to the  reason for acute care hospitalization.  EXTREMITIES:  Without edema, deviation, or deformity.  Radial and dorsalis  pedal pulses were palpable bilaterally.  NEUROLOGY:  Brief screening neurologic survey was unremarkable.   The electrocardiogram is normal.  The chest x-ray had not yet been performed  at the time of this dictation.   LABORATORY DATA:   Not yet performed at the time of this dictation.   IMPRESSION:  1.  Chronic chest pain; rule out coronary artery disease.  The patient has      had multiple emergency department visits.  Definitive diagnosis is      warranted.  2.  Depression and other psychiatric diagnoses.   PLAN:  1.  Telemetry.  2.  Serial cardiac enzymes.  3.  Aspirin.  4.  Heparin.  5.  Intravenous nitroglycerin.  6.  Fasting lipid profile.  7.  Further measures per Dr. Katrinka Blazing.   This patient encounter was chaperoned by Erasmo Score, RN.      Ulyses Amor, MD  Electronically Signed     MSC/MEDQ  D:  04/04/2005  T:  04/04/2005  Job:  [id number]   cc:   Lyn Records, M.D.  Fax: [phone]

## 2010-07-20 NOTE — Discharge Summary (Signed)
NAME:  [name], [name] NO.:  [id number]   MEDICAL RECORD NO.:  [id number]          PATIENT TYPE:  INP   LOCATION:  3729                         FACILITY:  [facility]   PHYSICIAN:  Lyn Records, M.D.   DATE OF BIRTH:  [birth year]   DATE OF ADMISSION:  04/04/2005  DATE OF DISCHARGE:  04/04/2005                                 DISCHARGE SUMMARY   DISCHARGE DIAGNOSES:  1.  Chest pain, felt to be noncardiac in nature.  2.  Depression.  3.  Family history of coronary artery disease.   HOSPITAL COURSE:  Ms. Prest is a 60 year old female who has a history of  chronic chest pain and has been seen in the emergency department multiple  times for this chest pain.  She has even seen Dr. Katrinka Blazing in the past and  underwent cardiac catheterization in July of 2004 which showed no  angiographically significant coronary artery disease.   On April 03, 2005, she experienced some general chest pain throughout the  day; however, her chest pain became more intense during the evening and she  was intubated at [facility].  She described the pain as chest  heaviness with radiation down both arms, down both legs and into her neck.  She did have some mild dyspnea, but no nausea, vomiting or diaphoresis.  She  took sublingual nitroglycerin; however, this did not seem to affect her  chest pain.   She was admitted to [facility] and she underwent laboratory studies that  showed normal cardiac isoenzymes.  Her lipid profile is still pending.  As  part of her workup, the patient underwent an exercise Cardiolite.  The  Cardiolite's exercise portion was negative and the images showed no wall  motion abnormalities, no ischemia, normal ejection fraction of 76%.  The  patient has denied any further chest pain during the hospital and because of  her normal studies and normal laboratory studies, we will discharge her to  home.   She is discharged to home in stable condition.   DISCHARGE MEDICATIONS:  Her discharge medications include continuing her  home medications which include:  1.  Paxil daily.  2.  Xanax and clonazepam as needed.  3.  Librax.  4.  Ambien nightly p.r.n.  5.  Hycosamine.  6.  Clidinium four times per day.  7.  We have given her a prescription for sublingual nitroglycerin p.r.n.      chest pain.   DISCHARGE INSTRUCTIONS:  She is to call for any recurrent chest pain.   FOLLOWUP:  She is to follow up with Dr. Katrinka Blazing on April 25, 2005 at 4 p.m.      Guy Franco, P.A.      Lyn Records, M.D.  Electronically Signed    LB/MEDQ  D:  04/04/2005  T:  04/05/2005  Job:  [id number]   cc:   Ladell Pier, M.D.  Fax: [phone]

## 2010-07-20 NOTE — H&P (Signed)
NAME:  [name], [name]                 ACCOUNT NO.:  [id number]   MEDICAL RECORD NO.:  [id number]          PATIENT TYPE:  INP   LOCATION:  1422                         [facility]ACILITY:  [facility]   PHYSICIAN:  Sigmund I. Patsi Sears, M.D.DATE OF BIRTH:  [birth year]   DATE OF ADMISSION:  06/02/2006  DATE OF DISCHARGE:                              HISTORY & PHYSICAL   [name] is a 60 year old single white female, seen in the office on  June 02, 2006, complaining of left flank pain, gross hematuria.  The  patient is 4 days status post bilateral ureteroscopy and stone removal,  and has a left double-J catheter in place.  She complains of intense  left flank pain, severe nausea, and appears agitated, angry.  She  appears to with her mother.  The patient is demanding and is threatening  suicide, and also to kill her mother if she is not admitted to the  hospital for IV pain medication.  The patient finally agreed to a  cystoscopy and removal of double-J catheter, this was accomplished in  the office prior to admission.  The patient is now admitted for IV  antibiotic, pain control, and psychiatric consultation.  The patient has  no known psychiatric history, but does admit that she has been given  anti-depressant medication (Paxil) in the past, which she does not take  because  I do not like to take pills.   PAST MEDICAL HISTORY:  Stent for hypertension, hypokalemia.   ALLERGIES:  ZOFRAN.   MEDICATIONS:  1. Vasotec 5 mg one per day.  2. Aspirin 81 mg a day.  3. Cipro 500 mg b.i.d. since last week.   Tobacco is unknown.  Alcohol is unknown.  Street drugs are none.   FAMILY HISTORY:  Noncontributory.   REVIEW OF SYSTEMS:  CONSTITUTIONAL:  Significant for chronic back and  flank pain.  The patient has remote history of depression (no notes). Pt  denies all other ROS.   PHYSICAL EXAM:  GENERAL:  Shows a thin elderly female in no acute  distress.  VITAL SIGNS:  Blood pressure is 139/97 of pulse 96,  temperature 99.  NECK:  Supple, nontender.  CHEST:  Clear to P&A.  ABDOMEN:  Soft plus bowel sounds, without organomegaly or masses.  She  has left flank pain to percussion.  GENITOURINARY:  Shows normal female external genitalia.  EXTREMITIES:  Show no cyanosis or edema.  SKIN:  Normal to inspection and palpation.  PSYCHOLOGIC:  Shows normal orientation to time, person, place.   IMPRESSION:  Pain and hematuria, and possible situational reaction, with  suicidal ideation, and threatening her mother.   PLAN:  Will admit to the hospital, IV antibiotic, pain medication,  psychiatric consultation and suicide prevention watch.      Sigmund I. Patsi Sears, M.D.  Electronically Signed     SIT/MEDQ  D:  06/03/2006  T:  06/03/2006  Job:  [id number]

## 2010-07-20 NOTE — Cardiovascular Report (Signed)
   NAME:  [name], [name]                           ACCOUNT NO.:  [id number]   MEDICAL RECORD NO.:  [id number]                   PATIENT TYPE:  INP   LOCATION:  2019                                 FACILITY:  [facility]   PHYSICIAN:  Lesleigh Noe, M.D.            DATE OF BIRTH:  06-10-1950   DATE OF PROCEDURE:  08/11/2002  DATE OF DISCHARGE:                              CARDIAC CATHETERIZATION   INDICATIONS:  Recurring chest pain, positive risk factor profile.  Negative  prior cardiac work-up.  The study is being done to definitively rule in or  rule out coronary artery disease.   PROCEDURE PERFORMED:  1. Left heart catheterization.  2. Selective coronary angiogram.  3. Left ventriculography.   DESCRIPTION OF PROCEDURE:  After informed consent, a 6-French sheath was  placed in the right femoral artery using the modified Seldinger technique.  A 6-French A2 multipurpose catheter was used for hemodynamic recordings,  left ventriculography, and selective left and right coronary angiography.  The patient tolerated the procedure without complications.   RESULTS:   I. HEMODYNAMIC DATA:  A.  Aortic pressure 122/76.  B.  Left ventricular pressure 122/5.   II. LEFT VENTRICULOGRAPHY:  The left ventricular size and systolic function  are normal.   III. CORONARY ANGIOGRAPHY:  A.  Left main coronary artery:  Normal.  B.  Left anterior descending coronary artery:  The LAD is large, tortuous,  and wraps around the left ventricular apex.  This vessel is normal.  C.  Circumflex coronary artery:  The circumflex is relatively small.  Gives  rise into one obtuse marginal branch.  No obstructions are noted.  D.  Ramus intermedius branch:  A large ramus branch arises from the distal  left main, trifurcates, and is free of any significant obstruction.  E.  Right coronary:  This is a large vessel, smooth, large PDA and two left  ventricular branches.  This artery is totally normal.   CONCLUSION:  1. Normal coronary arteries.  2. Normal left ventricular function.  3. Recurring chest pain noncardiac in origin.   PLAN:  Clinical follow-up with GI specialist.  No other specific cardiac  evaluation seems indicated.                                              Lesleigh Noe, M.D.   HWS/MEDQ  D:  08/11/2002  T:  08/11/2002  Job:  [id number]

## 2010-07-20 NOTE — H&P (Signed)
NAME:  [name], [name]                           ACCOUNT NO.:  [id number]   MEDICAL RECORD NO.:  [id number]                   PATIENT TYPE:  INP   LOCATION:  2019                                 FACILITY:  [facility]   PHYSICIAN:  Lyn Records, M.D.                DATE OF BIRTH:  1950/10/29   DATE OF ADMISSION:  08/10/2002  DATE OF DISCHARGE:                                HISTORY & PHYSICAL   ADMISSION DIAGNOSES:  1. Atypical chest pain, rule out myocardial infarction.  2. Reflux.  3. Strong family history of heart disease.   HISTORY OF PRESENT ILLNESS:  This is a 60 year old divorced white female  without personal prior history of coronary artery disease. She has a history  of tobacco abuse, quit in November of 2003, unknown lipid status, COPD, and  a strong family history of coronary artery disease.  Since November of last  year, the patient has complained of recurrent, prolonged chest and arm  discomfort.  She describes a tightness or band-like sensation in the chest  that varies in intensity, but rarely goes away for any length of time. She  also occasionally has some discomfort in the right arm down to the elbow and  a pinpricking sensation in the same. She has had a cardiac workup by her  primary care Kristeen Lantz including a stress test, echo, and EKG's and  apparently no cardiac abnormalities have been found to this point.   The patient has been visiting her mother.  She actually had to bring her  mother to the hospital for a cardiac catheterization this morning. The  patient complained of prolonged chest pain and went down to the emergency  room for further evaluation. She states that she been awakened from sleep a  few nights with this tightness in the chest that will not go away.  Usually  the discomfort will resolve or improve by lying down and seems to be worse  if she is exertion herself or doing dishes.   Currently, the patient has intermittent, recurrent chest discomfort  despite  Nitropaste.  EKG in the emergency room is without acute abnormality.   ALLERGIES:  CODEINE causing rash.   MEDICATIONS:  None.   PAST MEDICAL HISTORY:  1. Chronic recurrent chest pain, etiology unknown.  2. History of peptic ulcer disease - last esophagogastroduodenoscopy was     this past spring and was told that her ulcer was healing up well.  She     has also had a history of hemorrhoids and colonoscopy this year which was     okay.  3. Total abdominal hysterectomy.  4. Kidney stones.   SOCIAL HISTORY:  The patient has been divorced for 18 years. She does not  have any children. She currently has a home at [facility] and travels  between there and [place] and stays with family members. She is disabled  due  to some back and neck injuries.  She was a longterm smoker, but quit in  November of 2003.  However, most of her family members continue to smoke and  so she is exposed to second-hand smoke. She drinks a beer once a year.  No  illicit drug use. No regular exercise.   FAMILY HISTORY:  Both parents with heart disease.  She had two brothers and  four sisters; one brother and one sister are deceased.  The remaining  siblings are alive.   REVIEW OF SYSTEMS:  The patient denies fevers, chills, cough, or cold  symptoms.  She takes Nexium daily and has not had any problems with  indigestion lately. No melena or hematuria.  No indigestion or heartburn.  No palpitations. No lightheadedness or dizziness.  No shortness of breath.  No lower extremity edema or claudication. No change in vision or headaches.   PHYSICAL EXAMINATION:  VITAL SIGNS:  Blood pressure 130/92, heart rate 100.  GENERAL: The patient is alert and oriented x3 in no acute distress, lying on  the gurney.  HEENT:  Normocephalic and atraumatic.  Wears glasses.  Dentition in poor  repair.  NECK:  Supple without bruits or masses.  LUNGS:  Clear to auscultation.  HEART:  Regular rate and rhythm with an S4.  No murmurs, rubs, or gallops.  ABDOMEN:  Soft, nontender, and nondistended.  Positive bowel sounds.  EXTREMITIES: Without edema.  Distal pulses intact.  NEUROLOGY:  Nonfocal.  Mentation intact.   EKG nonacute.  Labs are pending.   The patient will be admitted for chest pain, rule out MI.  She has multiple  risk factors.  Will proceed with cardiac catheterization tomorrow if enzymes  are positive or recurrent pain.  Will treat with heparin, nitroglycerin, and  check a chest CT. Check appropriate laboratory.     Georgiann Cocker Jernejcic, P.A.                   Lyn Records, M.D.    TCJ/MEDQ  D:  08/10/2002  T:  08/10/2002  Job:  [id number]

## 2010-07-20 NOTE — Consult Note (Signed)
[facility]  Patient:    [name], [name]                          MRN: [id number] Proc. Date: 08/18/99 Adm. Date:  [id number] Attending:  Osvaldo Human CC:         Huey Bienenstock McDiarmid, M.D.             Madie Reno A. Dub Mikes, M.D.                          Consultation Report  DATE OF BIRTH:  [birth year]  REFERRING PHYSICIAN:  Huey Bienenstock McDiarmid, M.D.  REASON FOR CONSULTATION:  Abdominal pain.  ASSESSMENT:  1. Right and left lower quadrant pain with history of diverticulosis.  2. Chronic abdominal and pelvic pain with occasional diarrhea.  3. Question of reflux on Prilosec.  4. History of Indocin use on a chronic basis.  5. History of colon cancer in a family member (maternal uncle).  6. History of endometriosis status post total abdominal hysterectomy     with bilateral salpingo-oophorectomy.  7. Renal stone in left kidney with two renal cysts, unchanged in the     recent past, fully evaluated by Dr. Isabel Caprice by cystoscopy and retrograde     pyelogram.  8. History of genital herpes.  9. History of degenerative joint disease and osteoporosis and neck pain,     on disability for this. 10. History of major depression and anxiety disorder and questionable     psychosis. 11. History of ear surgery.  RECOMMENDATIONS:  1. Discontinue all nonsteroidals.  2. The patient may try Vioxx or Celebrex for her DJD.  3. Increase fluid and fiber in the diet.  4. Change Prilosec to nexium 40 mg 1 p.o. q.d.  5. Outpatient followup to be scheduled for colonoscopy to further work up     her symptoms.  DISCUSSION:  [name] is a 60 year old white female who has been extensively worked up by Dr. Tawanna Cooler McDiarmid and Dr. Isabel Caprice for above-mentioned problems.  She has had two CT scans of her abdomen which have been unrevealing except for diverticulosis.  She says she has had ongoing discomfort since May of this year.  A CT scan revealed renal stone which was not  thought to be causing her problems.  The patient says she has been under considerable stress with illness of her boyfriend and her mother.  She denies any specific relieving or aggravating factors with respect to the pain and says the pain is almost continuous and bothers her.  She averages two to three bowel movements per day which have been loose.  Initially she had about one to two bowel movements per day that were well formed, but since May of this year, her bowel habits have changed.  She denies any abdominal weight loss or weight gain. There is no history of GI bleeding in the form of BRBPR or melena.  She has occasional nausea without vomiting.  Her appetite has bee fairly poor in the last three to four weeks.  There is no history of ulcer, jaundice, colitis, dysphagia, odynophagia, blood transfusions, or tattoos.  She does have some reflux for which she is on Prilosec.  She denies any genitourinary or cardiorespiratory complaints.  There is no history of headache, dizziness, syncope, or near syncopal events.  There are no vascular problems.  She does have degenerative joint  disease and osteoporosis with musculoskeletal problems.  ALLERGIES:  Know allergy to CODEINE.  MEDICATIONS: 1. Ambien 10 mg 1/2 tablet q.h.s per Dr. Dub Mikes. 2. Hydroxyzine 25 mg 1 t.i.d. p.o. per Dr. Dub Mikes. 3. Neurontin 300 mg 1 t.i.d. 4. Premarin 0.625 mg q.d. 5. Prilosec 20 mg q.d. 6. Serzone 100 mg 1-1/2 in the morning and 2-1/2 at bedtime per Dr. Dub Mikes. 7. Acyclovir p.r.n. for flare up of genital herpes.  PAST MEDICAL HISTORY:   See list above.  SOCIAL HISTORY:  The patient is single.  She says she was divorced 13 years ago.  She is on disability and does not work. She lives with her sister in [place].  She denies the use of alcohol, tobacco, or drugs.  FAMILY HISTORY:  The patients father is in his early 62s and is healthy.  Her mother is 72 and has hypertension, heart disease, and diverticulosis.   She has three sisters, one of whom has multiple medical problems; the others are healthy.  She has one brother with whom she is not in touch and does not know much about his medical problems.  She denies a family history of prostate, breast, ovarian, or uterine cancer.  There is a history of colon cancer in a maternal uncle.  There are several family members with heart disease and hypertension.  REVIEW OF SYSTEMS: 1) Right and left lower quadrant pain.  2) Back pain.  3) Nausea.  4) Poor appetite.  5) No history of GI bleeding in the form of BRBPR or melena. 6) No history of abnormal weight loss or weight gain.  7) Back pain, neck pain.  PHYSICAL EXAMINATION:  GENERAL:  A middle-aged white female somewhat anxious but in no acute distress.  VITAL SIGNS:  Temperature 97.9, blood pressure 129/82, pulse 87 per minute, respiratory rate 20.  HEENT:  Atraumatic, normocephalic.  PERRL.  EOMI.  NECK:  Supple.  No JVD, thyromegaly, or lymphadenopathy.  CHEST:  Clear to auscultation.  S1 and S2 regular.  No murmur, rub, or gallop.  ABDOMEN:  Soft with surgical scars present in the lower abdomen.  Right and left lower quadrant tenderness on palpation with minimal guarding.  No rebound, rigidity, hepatosplenomegaly, no masses palpable.  RECTAL:  With moderate sphincter tone with guaiac negative brown stool on visual exam.  No other masses palpable on rectal exam.  EXTREMITIES:  Peripheral pulses normal.  CNS: Nonfocal.  LABORATORY EVALUATION:  Has been done recently and, therefore, have not repeated her labs in the ER today.  I will procure these results from Dr. McDiarmids office who have reassured me that they been essentially unrevealing.  On reviewing her old chart in the ER from Jul 27, 1999, she did have some calcium oxalate crystals in the stone.  I will discuss the history with Dr. Isabel Caprice to be 100% sure that her renal calcus is not causing problems.  Further recommendation  will be made once she sees me in the office within the next week.  DD:  08/18/99 TD:  08/18/99 Job: [id number]

## 2010-07-20 NOTE — H&P (Signed)
NAME:  [name], [name]                 ACCOUNT NO.:  [id number]   MEDICAL RECORD NO.:  [id number]          PATIENT TYPE:  OBV   LOCATION:  0102                         FACILITY:  [facility]   PHYSICIAN:  Hollice Espy, M.D.DATE OF BIRTH:  Sep 07, 1950   DATE OF ADMISSION:  02/14/2006  DATE OF DISCHARGE:                              HISTORY & PHYSICAL   CHIEF COMPLAINT:  Lower extremity swelling.   HISTORY OF PRESENT ILLNESS:  The patient is a 60 year old white female  with a past medical history of tobacco abuse, unspecified personality  disorder, and COPD who was sent over by her PCP, Dr. Lucita Ferrara, after  complaints of shortness of breath.  The patient frequently comes to the  emergency room with similar complaints and is evaluated.  Her chest x-  ray is negative.  She, however, does continue to smoke.  She has lately  been coming in complaining of lower extremity swelling.  She is noted to  have some pitting edema but when Dr. Flonnie Overman had contacted me after seeing  her in the office, he had noted her to have pitting edema and normal O2  sats.  He was concerned about the possibility of underlying volume  overload, COPD, or CHF, and I discussed with him and the plan will be to  admit the patient for observation.  When the patient came into the  emergency room, I ordered an arterial blood gas which did show a  decreased pO2 on room air of 75, however, with a normal O2 sat of 96%.  The rest of her antibiotic was unremarkable.  Her chest x-ray showed  chronic COPD emphysematous changes but nothing acute.   The patient, herself, is complaining of some lower extremity swelling as  well as a distended abdomen.  She denies any headaches, vision changes,  chest pain, palpitations.  No current shortness of breath, no wheezing,  coughing.  She does complain of some abdominal distention with no pain.  She does complain of feeling nauseated.  She does complain of lower  extremity swelling but denies  any hematuria, dysuria.  She denied any  constipation but does complain of some diarrhea.  Review of systems is  otherwise negative.   PAST MEDICAL HISTORY:  1. Personality disorder, otherwise unspecified.  2. COPD.  3. Tobacco abuse.  4. Hypertension.  5. Osteoporosis.   MEDICATIONS:  Based on previous discharge summaries and her medication  list:  1. Albuterol 2 puffs q.4 h. p.r.n.  2. Aspirin 81 p.o. daily.  3. Norvasc 10 p.o. daily.  4. Klonopin p.r.n.   ALLERGIES:  SHE IS ALLERGIC TO ZOFRAN.   SOCIAL HISTORY:  She denies any alcohol or drug use.  She does smoke  continuously.   FAMILY HISTORY:  Noncontributory.   PHYSICAL EXAMINATION:  VITAL SIGNS:  On admission, temp 98, heart rate  115, blood pressure 141/98, respirations 20, O2 sat 92% on room air.  GENERAL:  The patient is alert and oriented x3 in no apparent distress.  HEENT:  Normocephalic atraumatic.  Her mucous membranes are moist.  She  has no  carotid bruits.  HEART:  Regular rate and rhythm.  S1 S2.  LUNGS:  Decreased breath sounds throughout.  ABDOMEN:  Soft, distended, firm.  Decreased bowel sounds.  EXTREMITIES:  Show no clubbing, cyanosis, and a 1+ pitting edema from  about the mid knee down.   LABORATORY:  ABG on room air, 7.43, pCO2 40, pO2 76, bicarb 27.   ASSESSMENT/PLAN:  1. Lower extremity swelling.  This may be solely from her Norvasc      which can cause fluid retention.  However, certainly with her      history of chronic lung disease she may have some early signs of      congestive heart failure.  We will check a 2D echocardiogram and      start her on Lasix.  We will also hold her Norvasc.  2. Hypertension.  We will hold her Norvasc for now, treat her blood      pressure perhaps with an ACE inhibitor if we have elevated levels.  3. Anxiety.  Continue as-needed Klonopin.  4. Chronic obstructive pulmonary disease and emphysema.  No evidence      of any acute flare-up.  She needs to be  advised to stop smoking,      continue as-needed albuterol.  5. Abdominal distention.  We will check an abdominal x-ray to rule out      constipation.      Hollice Espy, M.D.  Electronically Signed     SKK/MEDQ  D:  02/14/2006  T:  02/14/2006  Job:  [id number]

## 2010-07-20 NOTE — Cardiovascular Report (Signed)
NAME:  [name], [name] NO.:  [id number]   MEDICAL RECORD NO.:  [id number]          PATIENT TYPE:  OBV   LOCATION:  5525                         FACILITY:  [facility]   PHYSICIAN:  Lyn Records, M.D.   DATE OF BIRTH:  05/06/1950   DATE OF PROCEDURE:  06/20/2005  DATE OF DISCHARGE:  06/20/2005                              CARDIAC CATHETERIZATION   INDICATIONS:  [name] has an extremely strong family history of  coronary artery disease.  She has been having intermittent chest discomfort  for many years.  She has had prior cardiac evaluation most recently with a  Cardiolite study in February that was normal.  No evidence of ischemia was  noted and LV function was normal.  In 2004 coronary angiogram was performed  and was normal.   Since January she has had 14 emergency room visits and two or three  hospitalizations for chest discomfort.  No objective evidence has been  found.  The patient is convinced that she has coronary disease and there is  concern that perhaps we could be missing progression of disease not seen at  the time of prior catheterization in 2004.  We are going to do coronary  angiography to rule out matched ischemia in all three territories that could  cause the Cardiolite to be normal.  I believe her chest discomfort is non-  cardiac.   PROCEDURE PERFORMED:  1.  Left heart catheterization.  2.  Selective coronary angio.  3.  Left ventriculography.   DESCRIPTION:  After informed consent a 6-French sheath was placed in the  right femoral artery using the modified Seldinger technique.  A 6-French A2  multipurpose catheter was then used for hemodynamic recordings, left  ventriculography by hand injection, and left and right coronary angiography.  The patient tolerated the procedure without complications.   RESULTS:   I. HEMODYNAMIC DATA:  A.  Aortic pressure 162/96.  B.  Left ventricular pressure 167/13.   II. LEFT VENTRICULOGRAPHY:  LV  cavity size and function are normal.  LVEDP  is normal as noted above.  Ejection fraction is greater than 60%.  No  regional wall motion abnormality is noted.  No mitral regurgitation is seen.   III. CORONARY ANGIOGRAPHY:  A.  Left main coronary: Normal.  B.  Left anterior descending coronary:  The LAD wraps around left  ventricular apex.  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  Several small diagonal branches arise from the LAD.  The LAD and all of its  branches are widely patent and normal.  C.  Circumflex artery:  Circumflex artery is small.  There are two obtuse  marginal branches.  The first obtuse marginal is a very early arising vessel  from the proximal circumflex.  The circumflex and the two obtuse marginal  branches are entirely normal.  D.  Right coronary:  The right coronary artery is dominant giving the PDA  and two large left ventricular branches.  The right coronary is entirely  normal.   CONCLUSION:  1.  Normal coronary arteries.  2.  Normal left ventricular function.  3.  Elevated blood pressure.  4.  Potassium documented by I-stat in the catheterization laboratory 2.9.  5.  Chest pain is non-cardiac.  I suspect this is musculoskeletal chest      pain/costochondritis.  Patient has had prior CT scans performed to rule      out structural disease.   PLAN:  No further cardiac evaluation.  Anti-inflammatory therapy.  Follow up  with primary care physician.      Lyn Records, M.D.  Electronically Signed     HWS/MEDQ  D:  06/20/2005  T:  06/21/2005  Job:  [id number]   cc:   Ladell Pier, M.D.  Fax: [phone]

## 2010-07-20 NOTE — Op Note (Signed)
NAME:  [name], [name]                 ACCOUNT NO.:  [id number]   MEDICAL RECORD NO.:  [id number]          PATIENT TYPE:  AMB   LOCATION:  ENDO                         FACILITY:  [facility]   PHYSICIAN:  Petra Kuba, M.D.    DATE OF BIRTH:  1951/02/21   DATE OF PROCEDURE:  01/09/2005  DATE OF DISCHARGE:                                 OPERATIVE REPORT   PROCEDURE:  EGD biopsy and Savary dilatation.   INDICATIONS:  Dysphagia. The patient refusing barium swallow. Consent was  signed after risks, benefits, methods and options thoroughly discussed in  the office on multiple occasions.   MEDICINES USED:  Fentanyl 40 mcg, Versed 8 mg.   PROCEDURE:  The video endoscope was inserted by direct vision. The esophagus  was normal.  In the distal esophagus was a small hiatal hernia, with a  widely patent thin fibrous ring.  The scope was passed into the stomach,  advanced to the antrum, through a normal pylorus, into a normal duodenal  bulb, and around the C-loop to a normal second and possibly third part of  the duodenum. We did take 2 biopsies of the duodenum to rule out any  malabsorption because of her probable post cholecystectomy diarrhea.  The  scope was withdrawn back to the bulb and a good look there ruled out ulcer  and malocclusions.  The scope was withdrawn back to the stomach; it showed  some difficulty holding air on straight and retroflex visualization.  Other  than the hiatal hernia being confirmed in the cardia, no stomach  abnormalities were seen. The scope was slowly withdrawn back to the upper  esophageal sphincter, and again no esophageal abnormalities were seen but  the hiatal hernia mentioned above.  The scope was reinserted to the antrum  and under fluoroscopic guidance the Savary wire was advanced. The customary  J-loop was confirmed under fluoroscopy. The scope was removed, making sure  under fluoroscopy to keep the wire in the proper location. We went ahead and  proceeded  with a 16 mm Savary dilator only. There was minimal resistance in  passing the dilator.  The dilator was confirmed in the proper position under  fluoroscopy.  The wire was withdrawn back into the dilator; both were  removed in tandem.  There was no heme on the dilators. The procedure was  terminated at this junction. This dilator and the wire were removed in  tandem.  The patient tolerated the procedure adequately. There was no  obvious immediate complication.   ENDOSCOPIC DIAGNOSES:  1.  Small hiatal hernia, with a widely patent, thin and fibrous ring.  2.  Otherwise normal EGD, status post duodenal biopsy to rule out      malabsorption.   THERAPY:  Savary dilatation under fluoroscopy 16 mm only , without heme and  only minimal resistance.   PLAN:  See how the dilation works. Continue Prilosec.  Await pathology.  Probably try Colestid for her diarrhea next, since she will not take  Carafate and probably would not do well with Questran as well.  ______________________________  Petra Kuba, M.D.     MEM/MEDQ  D:  01/09/2005  T:  01/10/2005  Job:  [id number]   cc:   Ladell Pier, M.D.  Fax: [phone]

## 2010-07-31 ENCOUNTER — Ambulatory Visit: Payer: Medicare Other | Attending: Orthopaedic Surgery | Admitting: Physical Therapy

## 2010-07-31 DIAGNOSIS — IMO0001 Reserved for inherently not codable concepts without codable children: Secondary | ICD-10-CM | POA: Insufficient documentation

## 2010-07-31 DIAGNOSIS — M546 Pain in thoracic spine: Secondary | ICD-10-CM | POA: Insufficient documentation

## 2010-08-02 ENCOUNTER — Encounter: Payer: Medicare Other | Admitting: Physical Therapy

## 2010-08-09 ENCOUNTER — Encounter: Payer: Medicare Other | Admitting: Physical Therapy

## 2010-08-14 ENCOUNTER — Encounter: Payer: Medicare Other | Admitting: Physical Therapy

## 2010-08-16 ENCOUNTER — Encounter: Payer: Medicare Other | Admitting: Physical Therapy

## 2010-11-23 LAB — URINALYSIS, ROUTINE W REFLEX MICROSCOPIC
Bilirubin Urine: NEGATIVE
Ketones, ur: NEGATIVE
Nitrite: NEGATIVE
Specific Gravity, Urine: 1.023
Urobilinogen, UA: 1

## 2010-11-24 ENCOUNTER — Emergency Department (HOSPITAL_COMMUNITY)
Admission: EM | Admit: 2010-11-24 | Discharge: 2010-11-24 | Disposition: A | Discharge status: Home / Self Care | Payer: Medicare Other | Attending: Emergency Medicine | Admitting: Emergency Medicine

## 2010-11-24 DIAGNOSIS — M549 Dorsalgia, unspecified: Secondary | ICD-10-CM | POA: Insufficient documentation

## 2010-11-24 DIAGNOSIS — M1990 Unspecified osteoarthritis, unspecified site: Secondary | ICD-10-CM | POA: Insufficient documentation

## 2010-11-24 DIAGNOSIS — E0590 Thyrotoxicosis, unspecified without thyrotoxic crisis or storm: Secondary | ICD-10-CM | POA: Insufficient documentation

## 2010-11-24 DIAGNOSIS — M810 Age-related osteoporosis without current pathological fracture: Secondary | ICD-10-CM | POA: Insufficient documentation

## 2010-11-24 DIAGNOSIS — G8929 Other chronic pain: Secondary | ICD-10-CM | POA: Insufficient documentation

## 2010-11-24 DIAGNOSIS — I10 Essential (primary) hypertension: Secondary | ICD-10-CM | POA: Insufficient documentation

## 2010-11-26 LAB — CBC
HCT: 39.3
Hemoglobin: 13.3
MCV: 93.7
Platelets: 229
WBC: 4.6

## 2010-11-26 LAB — DIFFERENTIAL
Eosinophils Relative: 3
Lymphs Abs: 2.1
Monocytes Absolute: 0.5
Neutro Abs: 1.7

## 2010-11-26 LAB — POCT CARDIAC MARKERS
CKMB, poc: <1 — ABNORMAL LOW
Myoglobin, poc: 35.6
Operator id: 277751
Troponin i, poc: <0.05

## 2010-11-26 LAB — I-STAT 8, (EC8 V) (CONVERTED LAB)
BUN: 13
Chloride: 108
Glucose, Bld: 96
HCT: 41
Potassium: 3.4 — ABNORMAL LOW
pCO2, Ven: 36.8 — ABNORMAL LOW
pH, Ven: 7.424 — ABNORMAL HIGH

## 2010-11-26 LAB — POCT I-STAT CREATININE
Creatinine, Ser: 0.9
Operator id: 277751

## 2010-11-27 LAB — POCT I-STAT, CHEM 8
BUN: 15
Creatinine, Ser: 0.9
Glucose, Bld: 91
Hemoglobin: 12.6
Sodium: 144
TCO2: 26

## 2010-11-27 LAB — POCT CARDIAC MARKERS
CKMB, poc: <1 — ABNORMAL LOW
Myoglobin, poc: 55.1
Myoglobin, poc: 69.8
Operator id: 277751

## 2010-11-27 LAB — URINALYSIS, ROUTINE W REFLEX MICROSCOPIC
Glucose, UA: NEGATIVE
Hgb urine dipstick: NEGATIVE
Hgb urine dipstick: NEGATIVE
Protein, ur: NEGATIVE
Specific Gravity, Urine: 1.017
Specific Gravity, Urine: 1.031 — ABNORMAL HIGH
Urobilinogen, UA: 0.2
pH: 5.5

## 2010-11-27 LAB — URINE MICROSCOPIC-ADD ON

## 2010-11-29 LAB — POCT I-STAT, CHEM 8
BUN: 18
Calcium, Ion: 1.02 — ABNORMAL LOW
Chloride: 113 — ABNORMAL HIGH
Creatinine, Ser: 1.1
Glucose, Bld: 109 — ABNORMAL HIGH
HCT: 38
Hemoglobin: 12.9
Potassium: 4.2
Sodium: 144
TCO2: 22

## 2010-11-29 LAB — POCT CARDIAC MARKERS
CKMB, poc: <1 — ABNORMAL LOW
Myoglobin, poc: 50.6
Operator id: 196461
Troponin i, poc: <0.05

## 2010-11-30 LAB — LIPID PANEL
HDL: 33 — ABNORMAL LOW
Triglycerides: 53
VLDL: 11

## 2010-11-30 LAB — COMPREHENSIVE METABOLIC PANEL
AST: 33
Albumin: 4.3
CO2: 29
Calcium: 9.7
Creatinine, Ser: 0.66
GFR calc Af Amer: >60
GFR calc non Af Amer: >60
Sodium: 140
Total Protein: 6.9

## 2010-11-30 LAB — CBC
MCHC: 33.6
MCV: 95.3
Platelets: 205
Platelets: 240
RDW: 13.3
RDW: 13.9

## 2010-11-30 LAB — CARDIAC PANEL(CRET KIN+CKTOT+MB+TROPI)
Relative Index: INVALID
Relative Index: INVALID
Total CK: 38
Troponin I: 0.01

## 2010-11-30 LAB — POCT CARDIAC MARKERS
CKMB, poc: <1 — ABNORMAL LOW
Myoglobin, poc: 48
Troponin i, poc: <0.05

## 2010-11-30 LAB — HEPARIN LEVEL (UNFRACTIONATED)
Heparin Unfractionated: 0.53
Heparin Unfractionated: 0.66

## 2010-11-30 LAB — TSH: TSH: 1.947

## 2010-11-30 LAB — D-DIMER, QUANTITATIVE: D-Dimer, Quant: <0.22

## 2010-12-03 LAB — POCT I-STAT, CHEM 8
BUN: 14
Calcium, Ion: 1.16
Chloride: 106
HCT: 31 — ABNORMAL LOW
Potassium: 3.1 — ABNORMAL LOW
Sodium: 144

## 2010-12-03 LAB — DIFFERENTIAL
Basophils Absolute: 0.2 — ABNORMAL HIGH
Basophils Relative: 3 — ABNORMAL HIGH
Lymphocytes Relative: 31
Monocytes Absolute: 0.7
Neutro Abs: 3.2
Neutrophils Relative %: 54

## 2010-12-03 LAB — URINE CULTURE

## 2010-12-03 LAB — URINALYSIS, ROUTINE W REFLEX MICROSCOPIC
Bilirubin Urine: NEGATIVE
Hgb urine dipstick: NEGATIVE
Ketones, ur: NEGATIVE
Nitrite: NEGATIVE
Urobilinogen, UA: 0.2
pH: 6.5

## 2010-12-03 LAB — CBC
Hemoglobin: 10.5 — ABNORMAL LOW
RDW: 11.4 — ABNORMAL LOW

## 2010-12-04 LAB — CBC
HCT: 42.1
Hemoglobin: 14.2
MCHC: 33.8
MCV: 95.3
RBC: 4.42
WBC: 6.2

## 2010-12-04 LAB — BASIC METABOLIC PANEL
CO2: 31
Chloride: 97
GFR calc Af Amer: >60
Potassium: 3.9
Sodium: 138

## 2010-12-07 LAB — DIFFERENTIAL
Basophils Absolute: 0
Basophils Relative: 0
Eosinophils Relative: 0
Monocytes Absolute: 0.7
Neutro Abs: 6.6

## 2010-12-07 LAB — URINE MICROSCOPIC-ADD ON

## 2010-12-07 LAB — URINALYSIS, ROUTINE W REFLEX MICROSCOPIC
Bilirubin Urine: NEGATIVE
Glucose, UA: NEGATIVE
Leukocytes, UA: NEGATIVE
Nitrite: NEGATIVE
Specific Gravity, Urine: 1.02
Specific Gravity, Urine: 1.021
Urobilinogen, UA: 1
pH: 6

## 2010-12-07 LAB — BASIC METABOLIC PANEL
BUN: 15
BUN: 16
CO2: 23
Calcium: 9.2
Calcium: 9.7
Creatinine, Ser: 0.7
GFR calc non Af Amer: >60
Glucose, Bld: 124 — ABNORMAL HIGH
Glucose, Bld: 96
Sodium: 138

## 2010-12-07 LAB — I-STAT 8, (EC8 V) (CONVERTED LAB)
Acid-Base Excess: 3 — ABNORMAL HIGH
BUN: 13
Chloride: 108
HCT: 35 — ABNORMAL LOW
Hemoglobin: 11.9 — ABNORMAL LOW
Operator id: 151321
Sodium: 143
pCO2, Ven: 39.7 — ABNORMAL LOW

## 2010-12-07 LAB — CBC
Hemoglobin: 11.8 — ABNORMAL LOW
MCHC: 34.6
Platelets: 239
Platelets: 302
RDW: 13.3
RDW: 13.4
WBC: 4.4

## 2010-12-07 LAB — POCT I-STAT CREATININE
Creatinine, Ser: 0.8
Operator id: 151321

## 2010-12-07 LAB — POCT CARDIAC MARKERS
Operator id: 151321
Troponin i, poc: <0.05

## 2010-12-11 ENCOUNTER — Emergency Department (HOSPITAL_COMMUNITY)
Admission: EM | Admit: 2010-12-11 | Discharge: 2010-12-12 | Disposition: A | Discharge status: Home / Self Care | Payer: Medicare Other | Attending: Emergency Medicine | Admitting: Emergency Medicine

## 2010-12-11 DIAGNOSIS — M810 Age-related osteoporosis without current pathological fracture: Secondary | ICD-10-CM | POA: Insufficient documentation

## 2010-12-11 DIAGNOSIS — E0590 Thyrotoxicosis, unspecified without thyrotoxic crisis or storm: Secondary | ICD-10-CM | POA: Insufficient documentation

## 2010-12-11 DIAGNOSIS — R1110 Vomiting, unspecified: Secondary | ICD-10-CM | POA: Insufficient documentation

## 2010-12-11 DIAGNOSIS — K5289 Other specified noninfective gastroenteritis and colitis: Secondary | ICD-10-CM | POA: Insufficient documentation

## 2010-12-11 DIAGNOSIS — I10 Essential (primary) hypertension: Secondary | ICD-10-CM | POA: Insufficient documentation

## 2010-12-11 LAB — URINALYSIS, ROUTINE W REFLEX MICROSCOPIC
Ketones, ur: NEGATIVE
Nitrite: NEGATIVE
Specific Gravity, Urine: 1.004 — ABNORMAL LOW
pH: 6

## 2010-12-11 LAB — LIPASE, BLOOD: Lipase: 31

## 2010-12-11 LAB — CBC
HCT: 37.3
Hemoglobin: 12.9
MCV: 91.4
Platelets: 202
Platelets: 231
Platelets: 260
RDW: 12.6
WBC: 3.9 — ABNORMAL LOW
WBC: 5.2

## 2010-12-11 LAB — DIFFERENTIAL
Basophils Relative: 1
Eosinophils Absolute: 0.2
Eosinophils Absolute: 0.1 — ABNORMAL LOW
Eosinophils Relative: 3
Lymphocytes Relative: 44
Lymphs Abs: 1.6
Lymphs Abs: 1.7
Lymphs Abs: 2.5
Monocytes Absolute: 0.4
Monocytes Relative: 9
Neutro Abs: 1.9
Neutro Abs: 1.5 — ABNORMAL LOW
Neutrophils Relative %: 44
Neutrophils Relative %: 39 — ABNORMAL LOW

## 2010-12-11 LAB — BASIC METABOLIC PANEL
BUN: 10
BUN: 19
Chloride: 101
Creatinine, Ser: 0.73
GFR calc Af Amer: >60
GFR calc non Af Amer: >60
Potassium: 3.3 — ABNORMAL LOW

## 2010-12-11 LAB — COMPREHENSIVE METABOLIC PANEL
ALT: 19
Alkaline Phosphatase: 111
BUN: 22
CO2: 24
Calcium: 9.4
GFR calc non Af Amer: >60
Glucose, Bld: 106 — ABNORMAL HIGH
Sodium: 141

## 2010-12-11 LAB — URINE CULTURE

## 2010-12-11 LAB — SEDIMENTATION RATE: Sed Rate: 11

## 2010-12-11 LAB — URINE MICROSCOPIC-ADD ON

## 2010-12-11 LAB — PREGNANCY, URINE: Preg Test, Ur: NEGATIVE

## 2010-12-12 LAB — URINALYSIS, ROUTINE W REFLEX MICROSCOPIC
Bilirubin Urine: NEGATIVE
Glucose, UA: NEGATIVE
Hgb urine dipstick: NEGATIVE
Ketones, ur: NEGATIVE
Nitrite: NEGATIVE
Protein, ur: NEGATIVE
Specific Gravity, Urine: 1.04 — ABNORMAL HIGH (ref 1.005–1.030)
pH: 6 (ref 5.0–8.0)

## 2010-12-12 LAB — URINE MICROSCOPIC-ADD ON

## 2010-12-12 LAB — COMPREHENSIVE METABOLIC PANEL
ALT: 16 U/L (ref 0–35)
AST: 40 U/L — ABNORMAL HIGH (ref 0–37)
Calcium: 9.6 mg/dL (ref 8.4–10.5)
GFR calc Af Amer: >90 mL/min (ref >90)
Sodium: 136 mEq/L (ref 135–145)
Total Protein: 8.1 g/dL (ref 6.0–8.3)

## 2010-12-12 LAB — DIFFERENTIAL
Basophils Relative: 0 % (ref 0–1)
Eosinophils Absolute: 0.1 10*3/uL (ref 0.0–0.7)
Eosinophils Relative: 2 % (ref 0–5)
Monocytes Absolute: 0.8 10*3/uL (ref 0.1–1.0)
Monocytes Relative: 15 % — ABNORMAL HIGH (ref 3–12)

## 2010-12-12 LAB — CBC
MCH: 31.9 pg (ref 26.0–34.0)
MCHC: 33.5 g/dL (ref 30.0–36.0)
Platelets: 281 10*3/uL (ref 150–400)

## 2010-12-13 LAB — URINALYSIS, ROUTINE W REFLEX MICROSCOPIC
Bilirubin Urine: NEGATIVE
Bilirubin Urine: NEGATIVE
Nitrite: NEGATIVE
Nitrite: NEGATIVE
Specific Gravity, Urine: 1.016
Specific Gravity, Urine: 1.019
Urobilinogen, UA: 1
Urobilinogen, UA: 1
pH: 6
pH: 6.5

## 2010-12-13 LAB — I-STAT 8, (EC8 V) (CONVERTED LAB)
BUN: 11
BUN: 9
Chloride: 107
Glucose, Bld: 97
Hemoglobin: 13.6
Hemoglobin: 14.3
Operator id: 277751
Potassium: 3.5
Potassium: 4.1
Sodium: 142
TCO2: 30
pCO2, Ven: 39.5 — ABNORMAL LOW

## 2010-12-13 LAB — POCT CARDIAC MARKERS
CKMB, poc: <1 — ABNORMAL LOW
Operator id: 265201

## 2010-12-13 LAB — COMPREHENSIVE METABOLIC PANEL
ALT: 25
Alkaline Phosphatase: 115
CO2: 30
GFR calc non Af Amer: >60
Glucose, Bld: 101 — ABNORMAL HIGH
Potassium: 4.4
Sodium: 141
Total Bilirubin: 0.9

## 2010-12-13 LAB — URINE CULTURE: Culture: NO GROWTH

## 2010-12-13 LAB — CBC
Hemoglobin: 13.9
MCHC: 35
RBC: 4.42

## 2010-12-13 LAB — DIFFERENTIAL
Basophils Relative: 1
Eosinophils Absolute: 0.1
Neutrophils Relative %: 48

## 2010-12-13 LAB — URINE MICROSCOPIC-ADD ON

## 2010-12-13 LAB — POCT I-STAT CREATININE: Operator id: 265201

## 2010-12-13 LAB — D-DIMER, QUANTITATIVE: D-Dimer, Quant: 0.22

## 2010-12-14 LAB — URINALYSIS, ROUTINE W REFLEX MICROSCOPIC
Leukocytes, UA: NEGATIVE
Nitrite: NEGATIVE
Specific Gravity, Urine: 1.012
Urobilinogen, UA: 1

## 2010-12-14 LAB — URINE MICROSCOPIC-ADD ON

## 2010-12-17 ENCOUNTER — Emergency Department (HOSPITAL_COMMUNITY)
Admission: EM | Admit: 2010-12-17 | Discharge: 2010-12-17 | Disposition: A | Discharge status: Home / Self Care | Payer: Medicare Other | Attending: Emergency Medicine | Admitting: Emergency Medicine

## 2010-12-17 DIAGNOSIS — W010XXA Fall on same level from slipping, tripping and stumbling without subsequent striking against object, initial encounter: Secondary | ICD-10-CM | POA: Insufficient documentation

## 2010-12-17 DIAGNOSIS — M549 Dorsalgia, unspecified: Secondary | ICD-10-CM | POA: Insufficient documentation

## 2010-12-17 DIAGNOSIS — Y92009 Unspecified place in unspecified non-institutional (private) residence as the place of occurrence of the external cause: Secondary | ICD-10-CM | POA: Insufficient documentation

## 2010-12-17 DIAGNOSIS — Z9889 Other specified postprocedural states: Secondary | ICD-10-CM | POA: Insufficient documentation

## 2010-12-19 LAB — URINALYSIS, ROUTINE W REFLEX MICROSCOPIC
Bilirubin Urine: NEGATIVE
Hgb urine dipstick: NEGATIVE
Nitrite: NEGATIVE
Protein, ur: NEGATIVE
Urobilinogen, UA: 0.2

## 2010-12-20 LAB — URINE CULTURE

## 2010-12-20 LAB — DIFFERENTIAL
Basophils Absolute: 0.1
Basophils Relative: 1
Eosinophils Relative: 2
Lymphocytes Relative: 38
Monocytes Absolute: 0.7

## 2010-12-20 LAB — I-STAT 8, (EC8 V) (CONVERTED LAB)
Acid-Base Excess: 5 — ABNORMAL HIGH
Bicarbonate: 27.5 — ABNORMAL HIGH
Chloride: 104
HCT: 44
Operator id: 277751
TCO2: 29
pCO2, Ven: 35 — ABNORMAL LOW
pH, Ven: 7.504 — ABNORMAL HIGH

## 2010-12-20 LAB — URINALYSIS, ROUTINE W REFLEX MICROSCOPIC
Bilirubin Urine: NEGATIVE
Glucose, UA: NEGATIVE
Glucose, UA: NEGATIVE
Hgb urine dipstick: NEGATIVE
Hgb urine dipstick: NEGATIVE
Protein, ur: NEGATIVE
Specific Gravity, Urine: 1.026
Urobilinogen, UA: 1
Urobilinogen, UA: 0.2
pH: 6

## 2010-12-20 LAB — URINE MICROSCOPIC-ADD ON

## 2010-12-20 LAB — CBC
HCT: 39.5
Hemoglobin: 13.3
MCHC: 33.6
Platelets: 280
RDW: 13.2

## 2011-02-02 ENCOUNTER — Encounter: Payer: Self-pay | Admitting: Emergency Medicine

## 2011-02-02 ENCOUNTER — Emergency Department (HOSPITAL_BASED_OUTPATIENT_CLINIC_OR_DEPARTMENT_OTHER)
Admission: EM | Admit: 2011-02-02 | Discharge: 2011-02-02 | Disposition: A | Discharge status: Home / Self Care | Payer: Medicare Other | Attending: Emergency Medicine | Admitting: Emergency Medicine

## 2011-02-02 DIAGNOSIS — F17200 Nicotine dependence, unspecified, uncomplicated: Secondary | ICD-10-CM | POA: Insufficient documentation

## 2011-02-02 DIAGNOSIS — L089 Local infection of the skin and subcutaneous tissue, unspecified: Secondary | ICD-10-CM

## 2011-02-02 MED ORDER — DOXYCYCLINE HYCLATE 100 MG PO TABS
100.0000 mg | ORAL_TABLET | Freq: Once | ORAL | Status: AC
  Administered 2011-02-02: 100 mg via ORAL
  Filled 2011-02-02: qty 1

## 2011-02-02 MED ORDER — CEPHALEXIN 500 MG PO CAPS: 500.0000 mg | ORAL_CAPSULE | Freq: Four times a day (QID) | ORAL | Status: AC

## 2011-02-02 MED ORDER — DOXYCYCLINE HYCLATE 100 MG PO CAPS: 100.0000 mg | ORAL_CAPSULE | Freq: Two times a day (BID) | ORAL | Status: AC

## 2011-02-02 MED ORDER — CEPHALEXIN 250 MG PO CAPS
ORAL_CAPSULE | ORAL | Status: AC
  Filled 2011-02-02: qty 1

## 2011-02-02 MED ORDER — CEPHALEXIN 250 MG PO CAPS
500.0000 mg | ORAL_CAPSULE | Freq: Once | ORAL | Status: AC
  Administered 2011-02-02: 500 mg via ORAL
  Filled 2011-02-02: qty 1

## 2011-02-02 NOTE — ED Provider Notes (Signed)
History     CSN: [id number] Arrival date & time: 02/02/2011 10:43 AM   First MD Initiated Contact with Patient 02/02/11 1215      Chief Complaint  Patient presents with  . Wound Infection    Pt reports poor wound healing to R hand .5 inch open wound to R handthumb and Back of hand    (Consider location/radiation/quality/duration/timing/severity/associated sxs/prior treatment) HPI Comments: Pt has had a rash on the right hand for 2 weeks.  She was seen at [facility] on [facility], and was diagnosed with poison ivy, given an IM injection, and was treated with a steroid taper.  This has not helped, and she therefore sought evaluation.   Patient is a 60 y.o. female presenting with rash.  Rash  This is a new problem. The current episode started more than 1 week ago. The problem has been gradually worsening. The problem is associated with nothing. There has been no fever. The rash is present on the right hand and right wrist. The pain is at a severity of 0/10. Associated symptoms include blisters. She has tried steriods for the symptoms. The treatment provided no relief.    History reviewed. No pertinent past medical history.  Past Surgical History  Procedure Date  . Abdominal hysterectomy   . Cholecystectomy   . Kidney stones   . Back surgery     History reviewed. No pertinent family history.  History  Substance Use Topics  . Smoking status: Current Some Day Smoker  . Smokeless tobacco: Not on file  . Alcohol Use: No    OB History    Grav Para Term Preterm Abortions TAB SAB Ect Mult Living                  Review of Systems  Skin: Positive for rash.  All other systems reviewed and are negative.    Allergies  Reglan and Zofran  Home Medications   Current Outpatient Rx  Name Route Sig Dispense Refill  . DIAZEPAM 10 MG PO TABS Oral Take 10 mg by mouth every 6 (six) hours as needed.      . OXYCODONE HCL 15 MG PO TABS Oral Take 15 mg by mouth every  4 (four) hours as needed.      . CEPHALEXIN 500 MG PO CAPS Oral Take 1 capsule (500 mg total) by mouth 4 (four) times daily. 40 capsule 0  . DOXYCYCLINE HYCLATE 100 MG PO CAPS Oral Take 1 capsule (100 mg total) by mouth 2 (two) times daily. 20 capsule 0    BP 120/70  Pulse 69  Temp 98.6 F (37 C)  Resp 18  Wt 117 lb (53.071 kg)  SpO2 100%  Physical Exam  Constitutional: She is oriented to person, place, and time.       Slender elderly woman in no distress.  HENT:  Head: Normocephalic and atraumatic.  Right Ear: External ear normal.  Left Ear: External ear normal.  Eyes: Conjunctivae and EOM are normal. Pupils are equal, round, and reactive to light.  Neck: Normal range of motion. Neck supple.  Cardiovascular: Normal rate, regular rhythm and normal heart sounds.   Pulmonary/Chest: Effort normal and breath sounds normal.  Abdominal: Soft. There is no tenderness.  Musculoskeletal: Normal range of motion.  Neurological: She is alert and oriented to person, place, and time.       No sensory or motor deficit.  Skin:       She has excoriations appx  5 mm in diameter sparsely scattered on the right thumb, dorsum of right hand and wrist.  There is no lympangitic streaking.  A culture of the lesions was obtained.    Psychiatric: She has a normal mood and affect. Her behavior is normal.    ED Course  Procedures (including critical care time)   Labs Reviewed  WOUND CULTURE   Rx with Keflex and doxycycline x 10 days.   1. Skin infection           Carleene Cooper III, MD 02/02/11 [phone]

## 2011-02-02 NOTE — ED Notes (Signed)
Wound care reviewed.

## 2011-02-02 NOTE — ED Notes (Signed)
Open wound to R thumb and Back of hand no drainage

## 2011-02-02 NOTE — ED Notes (Signed)
Wound care to sites on hand with bacitracin wound care reviewed with pt

## 2011-02-06 LAB — WOUND CULTURE: Gram Stain: NONE SEEN

## 2011-02-07 NOTE — ED Notes (Signed)
+   wound Patient treated with Doxycyline-sensitive to same-chart appended per protocol MD.

## 2011-03-11 ENCOUNTER — Ambulatory Visit
Admission: RE | Admit: 2011-03-11 | Discharge: 2011-03-11 | Disposition: A | Discharge status: Home / Self Care | Payer: Medicare Other | Source: Ambulatory Visit | Attending: Geriatric Medicine | Admitting: Geriatric Medicine

## 2011-03-11 ENCOUNTER — Other Ambulatory Visit: Payer: Self-pay | Admitting: Geriatric Medicine

## 2011-03-11 DIAGNOSIS — S0990XA Unspecified injury of head, initial encounter: Secondary | ICD-10-CM

## 2011-05-19 ENCOUNTER — Emergency Department (HOSPITAL_COMMUNITY)
Admission: EM | Admit: 2011-05-19 | Discharge: 2011-05-19 | Disposition: A | Discharge status: Home / Self Care | Payer: Medicare Other | Attending: Emergency Medicine | Admitting: Emergency Medicine

## 2011-05-19 ENCOUNTER — Emergency Department (HOSPITAL_COMMUNITY): Payer: Medicare Other

## 2011-05-19 ENCOUNTER — Encounter (HOSPITAL_COMMUNITY): Payer: Self-pay

## 2011-05-19 DIAGNOSIS — M25519 Pain in unspecified shoulder: Secondary | ICD-10-CM | POA: Insufficient documentation

## 2011-05-19 DIAGNOSIS — M542 Cervicalgia: Secondary | ICD-10-CM | POA: Insufficient documentation

## 2011-05-19 DIAGNOSIS — X58XXXA Exposure to other specified factors, initial encounter: Secondary | ICD-10-CM | POA: Insufficient documentation

## 2011-05-19 DIAGNOSIS — S161XXA Strain of muscle, fascia and tendon at neck level, initial encounter: Secondary | ICD-10-CM

## 2011-05-19 DIAGNOSIS — Z79899 Other long term (current) drug therapy: Secondary | ICD-10-CM | POA: Insufficient documentation

## 2011-05-19 DIAGNOSIS — S139XXA Sprain of joints and ligaments of unspecified parts of neck, initial encounter: Secondary | ICD-10-CM | POA: Insufficient documentation

## 2011-05-19 MED ORDER — HYDROCODONE-ACETAMINOPHEN 5-325 MG PO TABS
1.0000 | ORAL_TABLET | Freq: Once | ORAL | Status: AC
  Administered 2011-05-19: 1 via ORAL
  Filled 2011-05-19: qty 1

## 2011-05-19 MED ORDER — IBUPROFEN 800 MG PO TABS: 800.0000 mg | ORAL_TABLET | Freq: Three times a day (TID) | ORAL | Status: AC

## 2011-05-19 MED ORDER — KETOROLAC TROMETHAMINE 60 MG/2ML IM SOLN
30.0000 mg | Freq: Once | INTRAMUSCULAR | Status: AC
  Administered 2011-05-19: 30 mg via INTRAMUSCULAR
  Filled 2011-05-19: qty 2

## 2011-05-19 MED ORDER — DIAZEPAM 5 MG PO TABS: 5.0000 mg | ORAL_TABLET | Freq: Two times a day (BID) | ORAL | Status: AC

## 2011-05-19 MED ORDER — DIAZEPAM 5 MG PO TABS
5.0000 mg | ORAL_TABLET | Freq: Once | ORAL | Status: AC
  Administered 2011-05-19: 5 mg via ORAL
  Filled 2011-05-19: qty 1

## 2011-05-19 NOTE — ED Provider Notes (Signed)
Medical screening examination/treatment/procedure(s) were performed by non-physician practitioner and as supervising physician I was immediately available for consultation/collaboration.   Naji Mehringer A. Katoya Amato, MD 05/19/11 2348 

## 2011-05-19 NOTE — Discharge Instructions (Signed)
Take ibuprofen w/ food up to three times a day, as needed for pain.   Take valium as needed for severe pain.   Do not drive within four hours of taking this medication (may cause drowsiness or confusion).   Apply an ice pack or heating pad as 2-3 times a dayCervical Strain Care After A cervical strain is when the muscles and ligaments in your neck have been stretched. The bones are not broken. If you had any problems moving your arms or legs immediately after the injury, even if the problem has gone away, make sure to tell this to your caregiver.  HOME CARE INSTRUCTIONS   While awake, apply ice packs to the neck or areas of pain about every 1 to 2 hours, for 15 to 20 minutes at a time. Do this for 2 days. If you were given a cervical collar for support, ask your caregiver if you may remove it for bathing or applying ice.   If given a cervical collar, wear as instructed. Do not remove any collar unless instructed by a caregiver.   Only take over-the-counter or prescription medicines for pain, discomfort, or fever as directed by your caregiver.  Recheck with the hospital or clinic after a radiologist has read your X-rays. Recheck with the hospital or clinic to make sure the initial readings are correct. Do this also to determine if you need further studies. It is your responsibility to find out your X-ray results. X-rays are sometimes repeated in one week to ten days. These are often repeated to make sure that a hairline fracture was not overlooked. Ask your caregiver how you are to find out about your radiology (X-ray) results. SEEK IMMEDIATE MEDICAL CARE IF:   You have increasing pain in your neck.   You develop difficulties swallowing or breathing.   You have numbness, weakness, or movement problems in the arms or legs.   You have difficulty walking.   You develop bowel or bladder retention or incontinence.   You have problems with walking.  MAKE SURE YOU:   Understand these instructions.     Will watch your condition.   Will get help right away if you are not doing well or get worse.  Document Released: 02/18/2005 Document Revised: 10/31/2010 Document Reviewed: 10/02/2007 [facility] Patient Information 2012 [place], [place]. and avoid activities that aggravate pain.  Follow up with Dr. Noel Gerold or your family doctor if pain has not started to improve in 3-4 days.   You may return to the ER if symptoms worsen or you have any other concerns.

## 2011-05-19 NOTE — ED Provider Notes (Signed)
History     CSN: [id number]  Arrival date & time 05/19/11  1543   First MD Initiated Contact with Patient 05/19/11 1851      Chief Complaint  Patient presents with  . Shoulder Pain    (Consider location/radiation/quality/duration/timing/severity/associated sxs/prior treatment) HPI History provided by pt.   Pt has had severe pain in right posterior neck that radiates into posterior shoulder x 2 days.  Describes as tightness; no spasm.  Aggravated by applying pressure, turning head to left and elevating right arm.  Mild relief w/ ice and rest.  No associated fever, RUE weakness/paresthesias, CP or SOB.  Denies trauma.  Has h/o cervical spine surgery.  History reviewed. No pertinent past medical history.  Past Surgical History  Procedure Date  . Abdominal hysterectomy   . Cholecystectomy   . Kidney stones   . Back surgery     No family history on file.  History  Substance Use Topics  . Smoking status: Current Some Day Smoker  . Smokeless tobacco: Not on file  . Alcohol Use: No    OB History    Grav Para Term Preterm Abortions TAB SAB Ect Mult Living                  Review of Systems  All other systems reviewed and are negative.    Allergies  Reglan and Zofran  Home Medications   Current Outpatient Rx  Name Route Sig Dispense Refill  . DIAZEPAM 10 MG PO TABS Oral Take 10 mg by mouth every 6 (six) hours as needed.      . OXYCODONE HCL 15 MG PO TABS Oral Take 15 mg by mouth every 4 (four) hours as needed.        BP 148/113  Pulse 98  Temp 99 F (37.2 C)  Resp 16  SpO2 99%  Physical Exam  Nursing note and vitals reviewed. Constitutional: She is oriented to person, place, and time. She appears well-developed and well-nourished. No distress.  HENT:  Head: Normocephalic and atraumatic.  Eyes:       Normal appearance  Neck: Normal range of motion.       No carotid bruit  Cardiovascular: Normal rate and regular rhythm.   Pulmonary/Chest: Effort normal  and breath sounds normal.       No pleuritic pain reported.  Musculoskeletal:       Vertical surgical scar down cervical spine and upper thoracic spine.  Entire spine non-tender. Tenderness over right trapezius.  No bony tenderness of shoulder.  Pain aggravated by abduction/flexion of shoulder past 90deg as well as rotation of head to the left.  2+ radial pulse and distal sensation intact.    Neurological: She is alert and oriented to person, place, and time.  Skin: Skin is warm and dry. No rash noted.  Psychiatric: She has a normal mood and affect. Her behavior is normal.    ED Course  Procedures (including critical care time)  Labs Reviewed - No data to display No results found.   1. Cervical strain       MDM  60yo healthy F presents w/ non-traumatic right posterior neck pain that is aggravated by ROM RUE and head rotation.  No cervical spine tenderness or radicular s/sx.  Tenderness of right trap only on exam.  Suspect cervical strain.  Pt receiving po vicodin/valium as well as IM toradol.  Will reassess shortly..  Pt reports that her pain is improved.  On re-examination, active ROM of RUE  and rotation of head w/ less pain.  Pt d/c'd home w/ valium and ibuprofen.  She has a PCP as well as orthopedist to f/u with.        Arie Sabina [place], [place] 05/19/11 2037

## 2011-05-19 NOTE — ED Notes (Signed)
Patient here with right neck and shoulder pain for the past few days. 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

## 2011-06-18 ENCOUNTER — Emergency Department (HOSPITAL_COMMUNITY): Payer: Medicare Other

## 2011-06-18 ENCOUNTER — Encounter (HOSPITAL_COMMUNITY): Payer: Self-pay

## 2011-06-18 ENCOUNTER — Emergency Department (HOSPITAL_COMMUNITY)
Admission: EM | Admit: 2011-06-18 | Discharge: 2011-06-19 | Disposition: A | Discharge status: Home / Self Care | Payer: Medicare Other | Attending: Emergency Medicine | Admitting: Emergency Medicine

## 2011-06-18 DIAGNOSIS — F3289 Other specified depressive episodes: Secondary | ICD-10-CM | POA: Insufficient documentation

## 2011-06-18 DIAGNOSIS — R5381 Other malaise: Secondary | ICD-10-CM | POA: Insufficient documentation

## 2011-06-18 DIAGNOSIS — R197 Diarrhea, unspecified: Secondary | ICD-10-CM | POA: Insufficient documentation

## 2011-06-18 DIAGNOSIS — F329 Major depressive disorder, single episode, unspecified: Secondary | ICD-10-CM | POA: Insufficient documentation

## 2011-06-18 DIAGNOSIS — F17200 Nicotine dependence, unspecified, uncomplicated: Secondary | ICD-10-CM | POA: Insufficient documentation

## 2011-06-18 DIAGNOSIS — R51 Headache: Secondary | ICD-10-CM | POA: Insufficient documentation

## 2011-06-18 LAB — DIFFERENTIAL
Basophils Absolute: 0 10*3/uL (ref 0.0–0.1)
Eosinophils Absolute: 0.4 10*3/uL (ref 0.0–0.7)
Eosinophils Relative: 9 % — ABNORMAL HIGH (ref 0–5)
Lymphs Abs: 2 10*3/uL (ref 0.7–4.0)

## 2011-06-18 LAB — CBC
MCH: 32 pg (ref 26.0–34.0)
MCV: 96.5 fL (ref 78.0–100.0)
Platelets: 225 10*3/uL (ref 150–400)
RDW: 12.7 % (ref 11.5–15.5)

## 2011-06-18 MED ORDER — SODIUM CHLORIDE 0.9 % IV BOLUS (SEPSIS)
1000.0000 mL | Freq: Once | INTRAVENOUS | Status: AC
  Administered 2011-06-18: 1000 mL via INTRAVENOUS

## 2011-06-18 MED ORDER — ACETAMINOPHEN 500 MG PO TABS
ORAL_TABLET | ORAL | Status: AC
  Administered 2011-06-18: 1000 mg via ORAL
  Filled 2011-06-18: qty 2

## 2011-06-18 NOTE — ED Notes (Signed)
Pt states that shes had frequent falls since January, she continues to have headaches and has lost weight since then also

## 2011-06-18 NOTE — ED Provider Notes (Signed)
History     CSN: [id number]  Arrival date & time 06/18/11  1749   First MD Initiated Contact with Patient 06/18/11 2043      Chief Complaint  Patient presents with  . Headache  . Fall  . Weight Loss    (Consider location/radiation/quality/duration/timing/severity/associated sxs/prior treatment) HPI Comments: Patient reports she has been very tired, sleeping a lot, depressed, crying all the time, has decreased appetite since January.  States she fell in January and since then is afraid she is going to fall all of the time, states she hold on to walls and uses a walker, feels off balance.  She has had intermittent diarrhea.  Has seen her PCP Dr Quintella Reichert who has done "complete bloodwork" including thyroid tests, which patient states were all normal.  This was done two weeks ago.  Dr Quintella Reichert started patient on Wellbutrin in early March.  Denies SI, HI.  Denies fevers, abdominal pain, cough, hemoptysis, abnormal bleeding or bruising, urinary symptoms.    Patient is a 61 y.o. female presenting with headaches and fall. The history is provided by the patient and a parent.  Headache  Pertinent negatives include no fever, no shortness of breath, no nausea and no vomiting.  Fall Associated symptoms include headaches. Pertinent negatives include no fever, no abdominal pain, no nausea, no vomiting and no hematuria.    History reviewed. No pertinent past medical history.  Past Surgical History  Procedure Date  . Abdominal hysterectomy   . Cholecystectomy   . Kidney stones   . Back surgery     History reviewed. No pertinent family history.  History  Substance Use Topics  . Smoking status: Current Some Day Smoker  . Smokeless tobacco: Not on file  . Alcohol Use: No    OB History    Grav Para Term Preterm Abortions TAB SAB Ect Mult Living                  Review of Systems  Constitutional: Positive for activity change and appetite change. Negative for fever and chills.  HENT:  Negative for sore throat and trouble swallowing.   Respiratory: Negative for cough and shortness of breath.   Gastrointestinal: Positive for diarrhea. Negative for nausea, vomiting, abdominal pain and blood in stool.  Genitourinary: Negative for dysuria, urgency, frequency, hematuria and vaginal bleeding.  Neurological: Positive for headaches.  Psychiatric/Behavioral: Negative for suicidal ideas and self-injury.  All other systems reviewed and are negative.    Allergies  Reglan and Zofran  Home Medications   Current Outpatient Rx  Name Route Sig Dispense Refill  . DIAZEPAM 10 MG PO TABS Oral Take 10 mg by mouth every 6 (six) hours as needed.      . OXYCODONE HCL 15 MG PO TABS Oral Take 15 mg by mouth every 4 (four) hours as needed.        BP 131/80  Pulse 88  Temp(Src) 98.9 F (37.2 C) (Oral)  Resp 18  Wt 98 lb 8 oz (44.679 kg)  SpO2 100%  Physical Exam  Nursing note and vitals reviewed. Constitutional: She is oriented to person, place, and time. She appears well-developed and well-nourished.  HENT:  Head: Normocephalic and atraumatic.  Neck: Neck supple.  Cardiovascular: Normal rate, regular rhythm and normal heart sounds.   Pulmonary/Chest: Breath sounds normal. No respiratory distress. She has no wheezes. She has no rales. She exhibits no tenderness.  Abdominal: Soft. Bowel sounds are normal. There is no tenderness.  Neurological: She is  alert and oriented to person, place, and time.  Psychiatric: Her speech is normal. Thought content normal. She exhibits a depressed mood. She expresses no homicidal and no suicidal ideation.       Tearful throughout interview    ED Course  Procedures (including critical care time)  Labs Reviewed  CBC - Abnormal; Notable for the following:    RBC 3.75 (*)    All other components within normal limits  DIFFERENTIAL - Abnormal; Notable for the following:    Neutrophils Relative 42 (*)    Eosinophils Relative 9 (*)    All other  components within normal limits  URINALYSIS, ROUTINE W REFLEX MICROSCOPIC  URINE CULTURE  COMPREHENSIVE METABOLIC PANEL   Dg Chest 2 View  06/18/2011  *RADIOLOGY REPORT*  Clinical Data: Weakness  CHEST - 2 VIEW  Comparison: Chest radiograph 09/22/2009  Findings: 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 There is a minimal convex left scoliosis of the upper thoracic spine, There is a probable nipple shadow on the left.  Previously described opacity in the right lung on chest radiograph of July 2011 is not seen on today's study.  The lungs are clear.  There is no airspace disease, effusion, or pneumothorax.  The heart and mediastinal contours are within normal limits.  The patient is status post cholecystectomy.  The bones appear osteopenic.  IMPRESSION:  1.  Probable left nipple shadow.  This could be confirmed with nipple markers if desired. 2.  No acute cardiopulmonary disease identified. 3.  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  Original Report Authenticated By: Britta Mccreedy, M.D.   Ct Head Wo Contrast  06/18/2011  *RADIOLOGY REPORT*  Clinical Data: Balance tissues.  No known injury.  CT HEAD WITHOUT CONTRAST  Technique:  Contiguous axial images were obtained from the base of the skull through the vertex without contrast.  Comparison: Head CT 03/11/2011  Findings: The ventricles are normal in size.  Negative for hemorrhage, hydrocephalus, mass effect, mass lesion, or evidence of acute infarction.  There is an air-fluid level in the right maxillary sinus.  This is new compared to the prior head CT of the January 2013 and compatible with acute sinusitis.  The remainder of the the visualized paranasal sinuses are clear.  The mastoid air cells and middle ears are clear.  The skull is intact.  The soft tissues of the orbits and scalp are symmetric.  IMPRESSION:  1.  Acute right maxillary sinusitis. 2.  No acute intracranial abnormality.  Original Report Authenticated By: Britta Mccreedy,  M.D.     Date: 06/18/2011  Rate: 84  Rhythm: normal sinus rhythm  QRS Axis: normal  Intervals: normal  ST/T Wave abnormalities: nonspecific T wave changes  Conduction Disutrbances:none  Narrative Interpretation:   Old EKG Reviewed: unchanged  2:03 AM Patient declines telepsych and behavioral health placement for depression.  States she prefers to go home.  CMP pending - lab had to redraw.    3:08 AM I called the lab and found that the blood had never been redrawn for the CMP.  I questioned the nurse and she stated she had no knowledge of the blood needing to be redrawn.  I have spoken with the patient and her mother who prefer to be d/c home with PCP follow up rather than wait for the lab.  Given patient having had full blood work done two weeks ago by PCP and patient's symptoms appearing to be related to depression, I will send the  patient home.  I will give patient follow up with [facility] for depression and have advised that she may return at any time.  Patient verbalizes understanding and agrees with plan.     1. Depression       MDM  Patient with symptoms of depression x several months.  Has been seen by PCP several times and started on Wellbutrin, which is not yet helping.  Pt sleeping often, decreased appetite, sad and tearful "all the time," fearful of falling.  She is neurologically intact and has no abnormal physical findings.  CBC and UA are normal.  Pt d/c home with PCP and psych follow up.  To return for any worsening symptoms.  Patient verbalizes understanding and agrees with plan.          Dillard Cannon Rushi Chasen [place], [place] 06/19/11 [phone]

## 2011-06-19 LAB — URINALYSIS, ROUTINE W REFLEX MICROSCOPIC
Glucose, UA: NEGATIVE mg/dL
Leukocytes, UA: NEGATIVE
Nitrite: NEGATIVE
Protein, ur: NEGATIVE mg/dL
Urobilinogen, UA: 0.2 mg/dL (ref 0.0–1.0)

## 2011-06-19 NOTE — ED Notes (Addendum)
In and out cath at 2:22 was not completed. Clicked off in error

## 2011-06-19 NOTE — Discharge Instructions (Signed)
Please call your doctor today to schedule a follow up appointment.  If you change your mind about speaking with a therapist or psychiatrist about your depression, please call [facility], listed above.  You may return to the ER at any time for worsening condition or any new symptoms that concern you.   Depression, Adolescent and Adult Depression is a true and treatable medical condition. In general there are two kinds of depression:  Depression we all experience in some form. For example depression from the death of a loved one, financial distress or natural disasters will trigger or increase depression.   Clinical depression, on the other hand, appears without an apparent cause or reason. This depression is a disease. Depression may be caused by chemical imbalance in the body and brain or may come as a response to a physical illness. Alcohol and other drugs can cause depression.  DIAGNOSIS  The diagnosis of depression is usually based upon symptoms and medical history. TREATMENT  Treatments for depression fall into three categories. These are:  Drug therapy. There are many medicines that treat depression. Responses may vary and sometimes trial and error is necessary to determine the best medicines and dosage for a particular patient.   Psychotherapy, also called talking treatments, helps people resolve their problems by looking at them from a different point of view and by giving people insight into their own personal makeup. Traditional psychotherapy looks at a childhood source of a problem. Other psychotherapy will look at current conflicts and move toward solving those. If the cause of depression is drug use, counseling is available to help abstain. In time the depression will usually improve. If there were underlying causes for the chemical use, they can be addressed.   ECT (electroconvulsive therapy) or shock treatment is not as commonly used today. It is a very effective treatment for severe  suicidal depression. During ECT electrical impulses are applied to the head. These impulses cause a generalized seizure. It can be effective but causes a loss of memory for recent events. Sometimes this loss of memory may include the last several months.  Treat all depression or suicide threats as serious. Obtain professional help. Do not wait to see if serious depression will get better over time without help. Seek help for yourself or those around you. In the [place] the number to the National Suicide Help Lines With 24 Hour Help Are: 1-800-SUICIDE (212)276-4663 Document Released: 02/16/2000 Document Revised: 02/07/2011 Document Reviewed: 10/07/2007 [facility] Patient Information 2012 [place], [place].

## 2011-06-19 NOTE — ED Provider Notes (Signed)
Patient relates in January she fell backwards and hit the back of her head. She relates she continues to have soreness in the back of her head. She states her physician ordered either an MRI or CT scan which was fine. She relates since then she's lost 10-15 pounds because she's not hungry. She has now started using a walker not because she's  losing her balance but because she is afraid she is going to fall. She denies abdominal pain nausea or vomiting.  Patient appears to be very anxious skin is warm and dry, she is alert and cooperative  Medical screening examination/treatment/procedure(s) were conducted as a shared visit with non-physician practitioner(s) and myself.  I personally evaluated the patient during the encounter Devoria Albe, MD, Franz Dell, MD 06/19/11 [phone]

## 2011-06-20 LAB — URINE CULTURE

## 2011-06-20 NOTE — ED Provider Notes (Signed)
See prior note   Ward Givens, MD 06/20/11 1047

## 2011-06-22 NOTE — ED Notes (Addendum)
+  Urine. Chart sent to EDP office for review. Chart returned from EDP office. Prescribed Bactrim DS 1 PO BID for 3 days. Prescribed by Betsey Holiday PA-C.

## 2011-06-27 NOTE — ED Notes (Signed)
Patient informed and order called to [facility]. By Steward Ros PFM

## 2011-11-01 ENCOUNTER — Other Ambulatory Visit (HOSPITAL_COMMUNITY): Payer: Self-pay | Admitting: Nurse Practitioner

## 2011-11-01 ENCOUNTER — Ambulatory Visit (HOSPITAL_COMMUNITY)
Admission: RE | Admit: 2011-11-01 | Discharge: 2011-11-01 | Disposition: A | Discharge status: Home / Self Care | Payer: Medicare Other | Source: Ambulatory Visit | Attending: Nurse Practitioner | Admitting: Nurse Practitioner

## 2011-11-01 DIAGNOSIS — R112 Nausea with vomiting, unspecified: Secondary | ICD-10-CM | POA: Insufficient documentation

## 2011-11-01 DIAGNOSIS — R51 Headache: Secondary | ICD-10-CM | POA: Insufficient documentation

## 2012-02-24 ENCOUNTER — Encounter (HOSPITAL_COMMUNITY): Payer: Self-pay | Admitting: Emergency Medicine

## 2012-02-24 ENCOUNTER — Observation Stay (HOSPITAL_COMMUNITY)
Admission: EM | Admit: 2012-02-24 | Discharge: 2012-02-27 | DRG: 313 | Disposition: A | Discharge status: Home / Self Care | Payer: Medicare Other | Attending: Internal Medicine | Admitting: Internal Medicine

## 2012-02-24 ENCOUNTER — Observation Stay (HOSPITAL_COMMUNITY): Payer: Medicare Other

## 2012-02-24 DIAGNOSIS — F32A Depression, unspecified: Secondary | ICD-10-CM | POA: Diagnosis present

## 2012-02-24 DIAGNOSIS — Z720 Tobacco use: Secondary | ICD-10-CM | POA: Diagnosis present

## 2012-02-24 DIAGNOSIS — Z5900 Homelessness unspecified: Secondary | ICD-10-CM | POA: Insufficient documentation

## 2012-02-24 DIAGNOSIS — G8929 Other chronic pain: Secondary | ICD-10-CM | POA: Diagnosis present

## 2012-02-24 DIAGNOSIS — R7989 Other specified abnormal findings of blood chemistry: Secondary | ICD-10-CM | POA: Diagnosis present

## 2012-02-24 DIAGNOSIS — R079 Chest pain, unspecified: Secondary | ICD-10-CM | POA: Diagnosis present

## 2012-02-24 DIAGNOSIS — R0789 Other chest pain: Principal | ICD-10-CM | POA: Insufficient documentation

## 2012-02-24 DIAGNOSIS — F3289 Other specified depressive episodes: Secondary | ICD-10-CM | POA: Insufficient documentation

## 2012-02-24 DIAGNOSIS — R197 Diarrhea, unspecified: Secondary | ICD-10-CM | POA: Diagnosis present

## 2012-02-24 DIAGNOSIS — F17200 Nicotine dependence, unspecified, uncomplicated: Secondary | ICD-10-CM | POA: Insufficient documentation

## 2012-02-24 DIAGNOSIS — R748 Abnormal levels of other serum enzymes: Secondary | ICD-10-CM | POA: Insufficient documentation

## 2012-02-24 DIAGNOSIS — M549 Dorsalgia, unspecified: Secondary | ICD-10-CM | POA: Insufficient documentation

## 2012-02-24 DIAGNOSIS — I10 Essential (primary) hypertension: Secondary | ICD-10-CM | POA: Diagnosis present

## 2012-02-24 DIAGNOSIS — R110 Nausea: Secondary | ICD-10-CM | POA: Insufficient documentation

## 2012-02-24 DIAGNOSIS — R911 Solitary pulmonary nodule: Secondary | ICD-10-CM | POA: Diagnosis present

## 2012-02-24 DIAGNOSIS — F329 Major depressive disorder, single episode, unspecified: Secondary | ICD-10-CM | POA: Insufficient documentation

## 2012-02-24 HISTORY — DX: Essential (primary) hypertension: I10

## 2012-02-24 LAB — CBC
Platelets: ADEQUATE 10*3/uL (ref 150–400)
RBC: 4.25 MIL/uL (ref 3.87–5.11)
RDW: 13.5 % (ref 11.5–15.5)
WBC: 4.8 10*3/uL (ref 4.0–10.5)

## 2012-02-24 LAB — POCT I-STAT, CHEM 8
Calcium, Ion: 1.23 mmol/L (ref 1.13–1.30)
HCT: 41 % (ref 36.0–46.0)
Sodium: 141 mEq/L (ref 135–145)
TCO2: 28 mmol/L (ref 0–100)

## 2012-02-24 LAB — CK TOTAL AND CKMB (NOT AT ARMC)
CK, MB: 1.6 ng/mL (ref 0.3–4.0)
Total CK: 52 U/L (ref 7–177)

## 2012-02-24 LAB — POCT I-STAT TROPONIN I

## 2012-02-24 LAB — PROTIME-INR
INR: 1 (ref 0.00–1.49)
Prothrombin Time: 13.1 seconds (ref 11.6–15.2)

## 2012-02-24 LAB — COMPREHENSIVE METABOLIC PANEL
Albumin: 4.1 g/dL (ref 3.5–5.2)
BUN: 20 mg/dL (ref 6–23)
Calcium: 10.6 mg/dL — ABNORMAL HIGH (ref 8.4–10.5)
Creatinine, Ser: 0.94 mg/dL (ref 0.50–1.10)
Total Protein: 7.7 g/dL (ref 6.0–8.3)

## 2012-02-24 MED ORDER — NICOTINE 7 MG/24HR TD PT24
7.0000 mg | MEDICATED_PATCH | Freq: Every day | TRANSDERMAL | Status: DC
  Administered 2012-02-24 – 2012-02-27 (×4): 7 mg via TRANSDERMAL
  Filled 2012-02-24 (×4): qty 1

## 2012-02-24 MED ORDER — NITROGLYCERIN 0.4 MG SL SUBL
0.4000 mg | SUBLINGUAL_TABLET | SUBLINGUAL | Status: DC | PRN
  Administered 2012-02-24: 0.4 mg via SUBLINGUAL
  Filled 2012-02-24 (×2): qty 25

## 2012-02-24 MED ORDER — IBUPROFEN 200 MG PO TABS
600.0000 mg | ORAL_TABLET | Freq: Once | ORAL | Status: AC
  Administered 2012-02-24: 600 mg via ORAL
  Filled 2012-02-24: qty 3

## 2012-02-24 MED ORDER — DIAZEPAM 5 MG PO TABS
5.0000 mg | ORAL_TABLET | Freq: Every evening | ORAL | Status: DC | PRN
  Administered 2012-02-25 – 2012-02-26 (×2): 5 mg via ORAL
  Filled 2012-02-24 (×2): qty 1

## 2012-02-24 MED ORDER — ASPIRIN 81 MG PO CHEW
324.0000 mg | CHEWABLE_TABLET | Freq: Once | ORAL | Status: AC
  Administered 2012-02-24: 324 mg via ORAL
  Filled 2012-02-24: qty 4

## 2012-02-24 MED ORDER — SODIUM CHLORIDE 0.9 % IV SOLN
INTRAVENOUS | Status: AC
  Administered 2012-02-24 – 2012-02-25 (×2): via INTRAVENOUS

## 2012-02-24 MED ORDER — HEPARIN (PORCINE) IN NACL 100-0.45 UNIT/ML-% IJ SOLN
800.0000 [IU]/h | INTRAMUSCULAR | Status: DC
  Administered 2012-02-24: 700 [IU]/h via INTRAVENOUS
  Filled 2012-02-24: qty 250

## 2012-02-24 MED ORDER — ALPRAZOLAM 0.25 MG PO TABS
0.2500 mg | ORAL_TABLET | Freq: Once | ORAL | Status: AC
Start: 2012-02-24 — End: 2012-02-24
  Administered 2012-02-24: 0.25 mg via ORAL
  Filled 2012-02-24: qty 1

## 2012-02-24 MED ORDER — ACETAMINOPHEN 650 MG RE SUPP: 650.0000 mg | Freq: Four times a day (QID) | RECTAL | Status: DC | PRN

## 2012-02-24 MED ORDER — POTASSIUM CHLORIDE CRYS ER 20 MEQ PO TBCR
40.0000 meq | EXTENDED_RELEASE_TABLET | Freq: Once | ORAL | Status: AC
  Administered 2012-02-24: 40 meq via ORAL
  Filled 2012-02-24: qty 2

## 2012-02-24 MED ORDER — OXYCODONE-ACETAMINOPHEN 5-325 MG PO TABS
0.5000 | ORAL_TABLET | Freq: Four times a day (QID) | ORAL | Status: DC | PRN
  Administered 2012-02-25 – 2012-02-27 (×6): 0.5 via ORAL
  Filled 2012-02-24 (×7): qty 1

## 2012-02-24 MED ORDER — NITROGLYCERIN IN D5W 200-5 MCG/ML-% IV SOLN
2.0000 ug/min | INTRAVENOUS | Status: DC
  Administered 2012-02-24: 5 ug/min via INTRAVENOUS
  Filled 2012-02-24: qty 250

## 2012-02-24 MED ORDER — OXYCODONE HCL 5 MG PO TABS: 2.5000 mg | ORAL_TABLET | Freq: Four times a day (QID) | ORAL | Status: DC | PRN

## 2012-02-24 MED ORDER — ACETAMINOPHEN 325 MG PO TABS
650.0000 mg | ORAL_TABLET | Freq: Four times a day (QID) | ORAL | Status: DC | PRN
  Administered 2012-02-26: 650 mg via ORAL
  Filled 2012-02-24: qty 2

## 2012-02-24 MED ORDER — HEPARIN BOLUS VIA INFUSION
3000.0000 [IU] | Freq: Once | INTRAVENOUS | Status: AC
  Administered 2012-02-24: 3000 [IU] via INTRAVENOUS

## 2012-02-24 MED ORDER — ASPIRIN EC 325 MG PO TBEC
325.0000 mg | DELAYED_RELEASE_TABLET | Freq: Every day | ORAL | Status: DC
  Administered 2012-02-25 – 2012-02-27 (×3): 325 mg via ORAL
  Filled 2012-02-24 (×4): qty 1

## 2012-02-24 MED ORDER — OXYCODONE-ACETAMINOPHEN 10-325 MG PO TABS: 0.5000 | ORAL_TABLET | Freq: Four times a day (QID) | ORAL | Status: DC | PRN

## 2012-02-24 MED ORDER — ESCITALOPRAM OXALATE 10 MG PO TABS
20.0000 mg | ORAL_TABLET | Freq: Once | ORAL | Status: AC
  Administered 2012-02-24: 20 mg via ORAL
  Filled 2012-02-24: qty 2

## 2012-02-24 MED ORDER — GABAPENTIN 300 MG PO CAPS
300.0000 mg | ORAL_CAPSULE | Freq: Two times a day (BID) | ORAL | Status: DC
  Administered 2012-02-25 – 2012-02-27 (×5): 300 mg via ORAL
  Filled 2012-02-24 (×8): qty 1

## 2012-02-24 NOTE — ED Provider Notes (Signed)
History     CSN: [id number]  Arrival date & time 02/24/12  1152   First MD Initiated Contact with Patient 02/24/12 1329      Chief Complaint  Patient presents with  . Depression  . Chest Pain    (Consider location/radiation/quality/duration/timing/severity/associated sxs/prior treatment) Patient is a 61 y.o. female presenting with chest pain. The history is provided by the patient. No language interpreter was used.  Chest Pain The chest pain began yesterday. Chest pain occurs constantly. The chest pain is unchanged. At its most intense, the pain is at 7/10. The pain is currently at 7/10. The quality of the pain is described as aching. The pain radiates to the left arm. Primary symptoms include nausea. Pertinent negatives for primary symptoms include no fever, no syncope, no shortness of breath, no vomiting and no dizziness.  Pertinent negatives for associated symptoms include no lower extremity edema.    61 year old female coming in with left chest pain it radiates down her left arm intermittently since yesterday. States yesterday afternoon she was sitting on the couch when the pain began. States that the pain is sharp and 7/10. Patient denies shortness of breath or  diaphor that had a little bit of nausea yesterday. She had a clean cardiac cath in 2007 with Dr. Katrinka Blazing. Patient does smoke. She has taken nothing for pain. She is living with her niece who told her she needed to find another place to live because she was having company coming for Christmas. Patient is homeless as of today.  History reviewed. No pertinent past medical history.  Past Surgical History  Procedure Date  . Abdominal hysterectomy   . Cholecystectomy   . Kidney stones   . Back surgery     No family history on file.  History  Substance Use Topics  . Smoking status: Current Some Day Smoker  . Smokeless tobacco: Not on file  . Alcohol Use: No    OB History    Grav Para Term Preterm Abortions TAB SAB Ect  Mult Living                  Review of Systems  Constitutional: Negative.  Negative for fever.  HENT: Negative.   Eyes: Negative.   Respiratory: Negative.  Negative for shortness of breath.   Cardiovascular: Positive for chest pain. Negative for leg swelling and syncope.  Gastrointestinal: Positive for nausea. Negative for vomiting.  Musculoskeletal:       Left chest tenderness  Skin: Negative.   Neurological: Negative.  Negative for dizziness.  Psychiatric/Behavioral: Negative.   All other systems reviewed and are negative.    Allergies  Compazine; Metoclopramide hcl; and Zofran  Home Medications   Current Outpatient Rx  Name  Route  Sig  Dispense  Refill  . ACETAMINOPHEN 500 MG PO TABS   Oral   Take 500 mg by mouth every 6 (six) hours as needed. For pain         . DIAZEPAM 5 MG PO TABS   Oral   Take 5 mg by mouth at bedtime as needed.         Marland Kitchen DICLOFENAC SODIUM 1 % TD GEL   Topical   Apply 1 application topically 4 (four) times daily. For back         . GABAPENTIN 300 MG PO CAPS   Oral   Take 300 mg by mouth 2 (two) times daily.         Marland Kitchen HYDROCHLOROTHIAZIDE 12.5 MG PO  CAPS   Oral   Take 12.5 mg by mouth daily.         . OXYCODONE-ACETAMINOPHEN 10-325 MG PO TABS   Oral   Take 0.5 tablets by mouth every 6 (six) hours as needed. For pain           BP 133/81  Pulse 91  Temp 98.9 F (37.2 C) (Oral)  Resp 18  SpO2 95%  Physical Exam  Nursing note and vitals reviewed. Constitutional: She is oriented to person, place, and time. She appears well-developed and well-nourished.  HENT:  Head: Normocephalic and atraumatic.  Eyes: Conjunctivae normal and EOM are normal. Pupils are equal, round, and reactive to light.  Neck: Normal range of motion. Neck supple.  Cardiovascular: Normal rate.   Pulmonary/Chest: Effort normal and breath sounds normal. No respiratory distress. She exhibits tenderness.  Abdominal: Soft. Bowel sounds are normal. She  exhibits no distension.  Musculoskeletal: Normal range of motion. She exhibits no edema and no tenderness.  Neurological: She is alert and oriented to person, place, and time. She has normal reflexes.  Skin: Skin is warm and dry.  Psychiatric: She has a normal mood and affect.    ED Course  Procedures (including critical care time)  Labs Reviewed  POCT I-STAT TROPONIN I - Abnormal; Notable for the following:    Troponin i, poc 0.22 (*)     All other components within normal limits  CBC   No results found.   No diagnosis found.  1700 Spoke with Dr. Jiles Garter who will consult for this patient. He recommends heparin drip. Pharmacy notified to dose the heparin drip.  MDM   61 year old female coming in with constant chest pain x24 hours with a positive troponin of 0.22. Hospitalist team 5 Dr. Betti Cruz will admit. Dr. Jocelyn Lamer will consult for cardiology. Heparin drip started in the ER. Patient is homeless until after Christmas. Social worker met with her for resources on a place to stay. EKG is unremarkable and unchanged.    Date: 02/24/2012  Rate: 82  Rhythm: normal sinus rhythm  QRS Axis: normal  Intervals: normal  ST/T Wave abnormalities: normal  Conduction Disutrbances:none  Narrative Interpretation:   Old EKG Reviewed: unchanged   Labs Reviewed  POCT I-STAT TROPONIN I - Abnormal; Notable for the following:    Troponin i, poc 0.22 (*)     All other components within normal limits  COMPREHENSIVE METABOLIC PANEL - Abnormal; Notable for the following:    Potassium 3.2 (*)     Calcium 10.6 (*)     GFR calc non Af Amer 64 (*)     GFR calc Af Amer 74 (*)     All other components within normal limits  POCT I-STAT, CHEM 8 - Abnormal; Notable for the following:    Potassium 3.3 (*)     All other components within normal limits  CBC  TROPONIN I  APTT  PROTIME-INR  CK TOTAL AND CKMB  TROPONIN I  PROTIME-INR  HEPARIN LEVEL (UNFRACTIONATED)  CBC  CLOSTRIDIUM DIFFICILE BY  PCR  TROPONIN I  CK TOTAL AND CKMB  CK TOTAL AND CKMB  BASIC METABOLIC PANEL  CLOSTRIDIUM DIFFICILE BY PCR  STOOL CULTURE  LIPID PANEL  HEPARIN LEVEL (UNFRACTIONATED)  CBC          Remi Haggard, NP 02/24/12 1920

## 2012-02-24 NOTE — ED Notes (Signed)
IV team will not be able to look at pt for a while. Pt on list

## 2012-02-24 NOTE — Consult Note (Signed)
Admit date: 02/24/2012 Referring Physician  Dr. Betti Cruz Primary Physician  Dr. Alwyn Ren Primary Cardiologist  Dr. Katrinka Blazing III Reason for Consultation  Chest pain, abnormal troponin  HPI: 61 y/o who started having chest pain yesterday at 1PM. It has been continuous and sharp.  She had a cath in 2006 which was reportedly clean.  Pain has been constant.  There was some mild improvement with SL NTG.  Currently pain is slightly better.  With multiple exams, her left chest pain has been reproducible with palpation.  She has not lifted anything heavy.  SHe has had pain like this in the past but it always resolved.  POC troponin was abnormal in the ER.     PMH:   Past Medical History  Diagnosis Date  . Hypertension      PSH:   Past Surgical History  Procedure Date  . Abdominal hysterectomy   . Cholecystectomy   . Kidney stones   . Back surgery     Allergies:  Compazine; Influenza vaccines; Metoclopramide hcl; and Zofran Prior to Admit Meds:   (Not in a hospital admission) Fam HX:    Family History  Problem Relation Age of Onset  . Alzheimer's disease Mother   . Heart failure Mother   . Stroke Father   . Coronary artery disease Sister   . Coronary artery disease Sister   . Coronary artery disease Sister    Social HX:    History   Social History  . Marital Status: Single    Spouse Name: N/A    Number of Children: N/A  . Years of Education: N/A   Occupational History  . Not on file.   Social History Main Topics  . Smoking status: Current Some Day Smoker    Types: Cigarettes  . Smokeless tobacco: Never Used  . Alcohol Use: No  . Drug Use: No  . Sexually Active: No   Other Topics Concern  . Not on file   Social History Narrative  . No narrative on file     ROS:  All 11 ROS were addressed and are negative except what is stated in the HPI  Physical Exam: Blood pressure 133/81, pulse 91, temperature 98.9 F (37.2 C), temperature source Oral, resp. rate 18, height 5' 2.5"  (1.588 m), weight 48.988 kg (108 lb), SpO2 95.00%.    General: Well developed, well nourished, in no acute distress Head:    Normal cephalic and atramatic  Lungs:   Clear bilaterally to auscultation. Heart:   HRRR S1 S2  No JVD.  Abdomen: Bowel sounds are positive, abdomen soft and non-tender without masses or                  Hernia's noted. Msk:  B Normal strength and tone for age. Extremities:   No  edema.  DP +1 Neuro: Alert and oriented X 3. Psych:  flat affect, responds appropriately    Labs:   Lab Results  Component Value Date   WBC 4.8 02/24/2012   HGB 13.9 02/24/2012   HCT 41.0 02/24/2012   MCV 92.9 02/24/2012   PLT PLATELET CLUMPS NOTED ON SMEAR, COUNT APPEARS ADEQUATE 02/24/2012    Lab 02/24/12 1752 02/24/12 1740  NA 141 --  K 3.3* --  CL 103 --  CO2 -- 29  BUN 22 --  CREATININE 1.00 --  CALCIUM -- 10.6*  PROT -- 7.7  BILITOT -- 0.4  ALKPHOS -- 67  ALT -- 26  AST -- 32  GLUCOSE 92 --   No results found for this basename: PTT   Lab Results  Component Value Date   INR 1.00 02/24/2012   Lab Results  Component Value Date   CKTOTAL 52 02/24/2012   CKMB 1.6 02/24/2012   TROPONINI <0.30 02/24/2012     Lab Results  Component Value Date   CHOL  Value: 129        ATP III CLASSIFICATION:  <200     mg/dL   Desirable  782-956  mg/dL   Borderline High  >=213    mg/dL   High 0/86/5784   Lab Results  Component Value Date   HDL 33* 09/28/2007   Lab Results  Component Value Date   LDLCALC  Value: 85        Total Cholesterol/HDL:CHD Risk Coronary Heart Disease Risk Table                     Men   Women  1/2 Average Risk   3.4   3.3 09/28/2007   Lab Results  Component Value Date   TRIG 53 09/28/2007   Lab Results  Component Value Date   CHOLHDL 3.9 09/28/2007   No results found for this basename: LDLDIRECT      Radiology:  Dg Chest 2 View  02/24/2012  *RADIOLOGY REPORT*  Clinical Data: Chest pain  CHEST - 2 VIEW  Comparison: 06/18/2011  Findings: Small  irregular nodular density projects lateral to the cardiac apex.  Minimal subsegmental atelectasis at the right base. Upper thoracic Harrington rods are stable.  No pneumothorax.  No pleural effusion.  IMPRESSION: New nodular density at the left base.  Attention on short-term follow-up chest film is recommended.  If the abnormality fails to resolve, CT can be performed to further delineate.   Original Report Authenticated By: Jolaine Click, M.D.     EKG:  NSR, rSR', no ST segment changes  ASSESSMENT: Chest pain>24 hours, abnormal troponin  PLAN:  Initial troponin was positive and she was having pain.  Heparin and NTG was started but repeat troponin is negative. Pain is atypical and clinically seems more like chest wall pain.  With pain > 24 hours, her lab troponin should be abnormal if this is ischemia.  She has multiple RF for CAD including tobacco abuse,.  If repeat enzymes turn positive, then will plan for cath.  If enzymes are negative,may treat for musculoskeletal cause and then plan for outpatient stress test.  We planned to transfer to [facility] when we thought she may need emergent cath.     She needs to stop smoking.  Corky Crafts., MD  02/24/2012  7:53 PM

## 2012-02-24 NOTE — Progress Notes (Signed)
Pt spoke with pt who confirms her mother and sister live in [place], [place] [place] [facility] county) and pt has been living in [place] ([place] with her niece, niece's husband, niece's mother in Social worker, niece's children and another woman and the woman's 4 kids.  States niece told her this morning that she could not remain in the home because she had visitors coming from [facility] and there was "not enough room" The niece went to work today.  Reports not having a father Reports having a [name], no money and reports niece assisted to put "a little gas in my [name] this morning before her husband took her to work." Pt stating she wants to go to [employer]" for her "nerves" but "I don't want to kill myself" Pt tearful during Cm interaction with her. Cm noted a garbage bag of belongings at bedside.  Pt reports she is familiar with [employer] but not [facility].  CM reviewed [facility] resource(to assist with housing, Lexapro outreach application for medication assistance from the outreach drug company, local [place] churches (to assist with funds), [place] housing information and [employer] with pt (to assist with medications).  Left pt with written information resources at her bedside Pt inquired about a "social worker" Cm informed her a SW had been consulted CM reviewed information with ACT team member.

## 2012-02-24 NOTE — Progress Notes (Signed)
[facility] ED Cm noted ED CM consult for  homeless needs funds for meds.   Cm spoke with Thurston Hole, ED PA/NP about not having an available chs program for medication assistance for medicare pt but able to offer resources for pt to get possibly some financial assistance and [facility] center) information for [employer]. Thurston Hole states pt having trouble getting Lexapro and was staying with a niece until recently. CM touched basis with SW on call about homeless resources.  Cm confirmed pt with medicare coverage listed

## 2012-02-24 NOTE — ED Notes (Signed)
Per patient had argument with mother a month ago, patient is staying with niece

## 2012-02-24 NOTE — ED Notes (Signed)
Iv team paged x 2  

## 2012-02-24 NOTE — Clinical Social Work Psychosocial (Addendum)
    Clinical Social Work Department BRIEF PSYCHOSOCIAL ASSESSMENT 02/24/2012  Patient:  [name], [name]     Account Number:  [id number]     Admit date:  02/24/2012  Clinical Social Worker:  Doree Albee  Date/Time:  02/24/2012 04:42 PM  Referred by:  RN  Date Referred:  02/24/2012 Referred for  Homelessness   Other Referral:   Interview type:  Patient Other interview type:    PSYCHOSOCIAL DATA Living Status:  ALONE Admitted from facility:   Level of care:   Primary support name:  none Primary support relationship to patient:   Degree of support available:   none    CURRENT CONCERNS Current Concerns  Post-Acute Placement   Other Concerns:    SOCIAL WORK ASSESSMENT / PLAN CSW met with pt at bedside to assess patient current csw needs. Pt states that she is not able to stay at her nieces where she has been living due to not being enough room during the holidays. Pt states that she put her last $5 of gas in her [name]. Pt states that [facility] not [facility] and would prefer to stay in a church. CSW and pt called [employer] for the [employer]. Pt was told to come to [facility] to complete process and that patient could sleep on a mat on the floor at a church. Pt stated that she can't sleep on the floor on a mat due to chronic back pain due to an injury. Pt stated that she will sleep in her [name]. CSW provided patient with shelter list, and outpatient psychiatry and counseling list, as patient takes lexapro.   Assessment/plan status:  Psychosocial Support/Ongoing Assessment of Needs Other assessment/ plan:   Information/referral to community resources:   shelter list  psychiatry and counseling resources    PATIENT'S/FAMILY'S RESPONSE TO PLAN OF CARE: pt thanked csw for concern and support. however pt refused to dc to shelter and plans to sleep in her [name] when medically stable.   Addendum: CSW also reviewed and  provided patient with [facility] resources.

## 2012-02-24 NOTE — ED Notes (Signed)
[id number]<ON> Expected date:<BR> Expected time:<BR> Means of arrival:<BR> Comments:<BR> Triage 4

## 2012-02-24 NOTE — ED Notes (Signed)
Pt now being admitted to [facility]

## 2012-02-24 NOTE — Progress Notes (Signed)
Melodie Bouillon, LCSW Social Worker Signed  Progress Notes 02/24/2012 3:52 PM   CSW met with pt at bedside to assess patient current csw needs. Pt states that she is not able to stay at her nieces where she has been living due to not being enough room during the holidays. Pt states that she put her last $5 of gas in her [name]. Pt states that she does not [facility] and would prefer to stay in a church. CSW and pt called [employer] for the [employer]. Pt was told to come to [facility] to complete process and that patient could sleep on a mat on the floor at a church. Pt stated that she can't sleep on the floor on a mat due to chronic back pain due to an injury. Pt stated that she will sleep in her [name]. CSW provided patient with shelter list, and outpatient psychiatry and counseling list, as patient takes lexapro. .No further Clinical Social Work needs, signing off.  Catha Gosselin, LCSWA [phone] .02/24/2012 1552pm

## 2012-02-24 NOTE — ED Notes (Signed)
Called [phone] to give report but the nurse receiving the pt had not arrived yet per the secretary. Nurse to call once she arrives.

## 2012-02-24 NOTE — Progress Notes (Signed)
ANTICOAGULATION CONSULT NOTE - Initial Consult  Pharmacy Consult for Heparin Indication: chest pain/ACS  Allergies  Allergen Reactions  . Compazine Rash    Gets spots on skin and has to get it flushed out of her system  . Influenza Vaccines Other (See Comments)    Got pneumonia, will not take vaccine  . Metoclopramide Hcl Hives  . Zofran Hives    Patient Measurements: Height: 62in Weight: 49kg Heparin Dosing Weight: 49kg  Vital Signs: Temp: 98.9 F (37.2 C) (12/23 1246) Temp src: Oral (12/23 1246) BP: 133/81 mmHg (12/23 1246) Pulse Rate: 91  (12/23 1246)  Labs:  [facility] 02/24/12 1424  HGB 13.7  HCT 39.5  PLT PLATELET CLUMPS NOTED ON SMEAR, COUNT APPEARS ADEQUATE  APTT --  LABPROT --  INR --  HEPARINUNFRC --  CREATININE --  CKTOTAL --  CKMB --  TROPONINI --    CrCl is unknown because there is no height on file for the current visit.   Medical History: Past Medical History  Diagnosis Date  . Hypertension     Medications:  Scheduled:    . [COMPLETED] ALPRAZolam  0.25 mg Oral Once  . [COMPLETED] aspirin  324 mg Oral Once  . [COMPLETED] escitalopram  20 mg Oral Once  . [COMPLETED] ibuprofen  600 mg Oral Once    Assessment:  37 YOF presents to ED with chest pain, first troponin elevated at 0.22  No anticoagulants prior to admit  Platelet clumps noted, however, patient does not have any hematologic history, so likely lab error.   Goal of Therapy:  Heparin level 0.3-0.7 units/ml Monitor platelets by anticoagulation protocol: Yes   Plan:   Heparin 3000 units IV bolus x 1  Heparin infusion 700 units/hr  Check heparin level and CBC 6hrs after starting  Daily heparin level and CBC   Loralee Pacas, PharmD, BCPS Pager: [phone] 02/24/2012,4:55 PM

## 2012-02-24 NOTE — H&P (Addendum)
Patient's PCP: Quentin Mulling, MD  Chief Complaint: Chest pain  History of Present Illness: [name] is a 61 y.o. Caucasian female with history of hypertension, chronic back pain and has had back surgeries, depression, and tobacco use who presents with the above complaints.  Patient reports that she has currently been homeless but has been living with family recently.  She had left-sided chest pain which started yesterday afternoon at 1-2 p.m., she has had persistent sharp pain which has not subsided.  She reports having pain running down her left arm.  She's not sure if the pain improved during the sleep but she woke up with the pain as a result she presented to the emergency department for further evaluation.  In the emergency department her point-of-care troponin was mildly elevated at 0.22 as a result the hospitalist service was asked to admit the patient for further care and management.  Patient denies any recent fevers, chills, nausea, vomiting, abdominal pain, headaches or vision changes.   She does complain of intermittent episodes of shortness of breath, currently denies any shortness of breath.  Denies any cough.  She does report having diarrhea since November, reports having about 8 bowel movements a day which is watery in nature.  She has a long-standing history of chronic back pain and takes opiates for pain management.  Currently she continues to complain of chest pain, 7/10.  Denies having any sweating with this pain.    Past Medical History  Diagnosis Date  . Hypertension    Past Surgical History  Procedure Date  . Abdominal hysterectomy   . Cholecystectomy   . Kidney stones   . Back surgery    Family History  Problem Relation Age of Onset  . Alzheimer's disease Mother   . Heart failure Mother   . Stroke Father   . Coronary artery disease Sister   . Coronary artery disease Sister   . Coronary artery disease Sister    History   Social History  . Marital Status:  Single    Spouse Name: N/A    Number of Children: N/A  . Years of Education: N/A   Occupational History  . Not on file.   Social History Main Topics  . Smoking status: Current Some Day Smoker    Types: Cigarettes  . Smokeless tobacco: Never Used  . Alcohol Use: No  . Drug Use: No  . Sexually Active: No   Other Topics Concern  . Not on file   Social History Narrative  . No narrative on file   Allergies: Compazine; Influenza vaccines; Metoclopramide hcl; and Zofran  Home Meds: Prior to Admission medications   Medication Sig Start Date End Date Taking? Authorizing Provider  acetaminophen (TYLENOL) 500 MG tablet Take 500 mg by mouth every 6 (six) hours as needed. For pain   Yes Historical Provider, MD  diazepam (VALIUM) 5 MG tablet Take 5 mg by mouth at bedtime as needed.   Yes Historical Provider, MD  diclofenac sodium (VOLTAREN) 1 % GEL Apply 1 application topically 4 (four) times daily. For back   Yes Historical Provider, MD  gabapentin (NEURONTIN) 300 MG capsule Take 300 mg by mouth 2 (two) times daily.   Yes Historical Provider, MD  hydrochlorothiazide (MICROZIDE) 12.5 MG capsule Take 12.5 mg by mouth daily.   Yes Historical Provider, MD  oxyCODONE-acetaminophen (PERCOCET) 10-325 MG per tablet Take 0.5 tablets by mouth every 6 (six) hours as needed. For pain   Yes Historical Provider, MD  Review of Systems: All systems reviewed with the patient and positive as per history of present illness, otherwise all other systems are negative.  Physical Exam: Blood pressure 133/81, pulse 91, temperature 98.9 F (37.2 C), temperature source Oral, resp. rate 18, height 5' 2.5" (1.588 m), weight 48.988 kg (108 lb), SpO2 95.00%. General: Awake, Oriented x3, No acute distress. HEENT: EOMI, Moist mucous membranes Neck: Supple CV: S1 and S2 Lungs: Clear to ascultation bilaterally Abdomen: Soft, Nontender, Nondistended, +bowel sounds. Ext: Good pulses. Trace edema. No clubbing or  cyanosis noted. Neuro: Cranial Nerves II-XII grossly intact. Has 5/5 motor strength in upper and lower extremities. Chest: Pain reproducible with palpation on the left side of her chest.   Lab results: No results found for this basename: NA:2,K:2,CL:2,CO2:2,GLUCOSE:2,BUN:2,CREATININE:2,CALCIUM:2,MG:2,PHOS:2 in the last 72 hours No results found for this basename: AST:2,ALT:2,ALKPHOS:2,BILITOT:2,PROT:2,ALBUMIN:2 in the last 72 hours No results found for this basename: LIPASE:2,AMYLASE:2 in the last 72 hours  Basename 02/24/12 1424  WBC 4.8  NEUTROABS --  HGB 13.7  HCT 39.5  MCV 92.9  PLT PLATELET CLUMPS NOTED ON SMEAR, COUNT APPEARS ADEQUATE   No results found for this basename: CKTOTAL:3,CKMB:3,CKMBINDEX:3,TROPONINI:3 in the last 72 hours No components found with this basename: POCBNP:3 No results found for this basename: DDIMER in the last 72 hours No results found for this basename: HGBA1C:2 in the last 72 hours No results found for this basename: CHOL:2,HDL:2,LDLCALC:2,TRIG:2,CHOLHDL:2,LDLDIRECT:2 in the last 72 hours No results found for this basename: TSH,T4TOTAL,FREET3,T3FREE,THYROIDAB in the last 72 hours No results found for this basename: VITAMINB12:2,FOLATE:2,FERRITIN:2,TIBC:2,IRON:2,RETICCTPCT:2 in the last 72 hours Imaging results:  No results found. Other results: EKG: Sinus rhythm with inverted T waves in leads V1, unchanged from previous EKG, heart rate 82.  Assessment & Plan by Problem: Chest pain Etiology unclear.  Point-of-care troponin is mildly elevated at 0.22, start heparin drip.  Admit to step down given ongoing chest pain.  Cardiology has been consulted appreciate their input.  Continue aspirin which was given in the emergency department.  Check lipid panel in the morning to risk stratify the patient.  Patient had a cardiac catheter on 06/20/2005 that showed normal coronary arteries, normal left ventricular function. Discussed with Dr. Eldridge Dace, cardiology who  will evaluate the patient.  Will get a chest x-ray to rule out any cardiopulmonary etiology for chest pain.  Suspect patient may also have musculoskeletal component of chest pain as it is reproducible on palpation.  N.p.o. after midnight for any planned cardiac procedures.  2-D echocardiogram ordered.  Hypertension Stable.  Continue hydrochlorothiazide.  Depression Patient given dose of escitalopram in the emergency department, continue to monitor.  Will request social worker to get patient resources for outpatient psychiatry.  Patient denied any suicidal thoughts or ideation.  Chronic back pain Stable.  Continue home pain medications.  Diarrhea Check C. difficile PCR and stool culture.  Etiology unclear.  Tobacco use Counseled on cessation.  Nicotine patch prescribed.  Patient motivated with smoking cessation.  Homeless Patient given resources in the emergency department, will request social work consult for any further resources.  Prophylaxis Heparin drip.  CODE STATUS Full code.  Disposition Admit to step down given persistent chest pain as observation.  Time spent on admission, talking to the patient, and coordinating care was: 60 mins.  Raelin Pixler A, MD 02/24/2012, 5:29 PM  Addendum: Discussed with Dr. Eldridge Dace, cardiology, preferred the patient at [facility] for possible cardiac cath. Chest x-ray reviewed which showed new nodular density at the left base, will need a  close followup as outpatient with CT of chest.  Emran Molzahn A, MD 02/24/2012, 6:59 PM

## 2012-02-24 NOTE — ED Notes (Signed)
Iv team paged.

## 2012-02-24 NOTE — Progress Notes (Signed)
CSW received referral for homelessness, csw attempted to assess patient however pt was with providers. CSW waiting to assess.   Catha Gosselin, LCSWA  [phone] .02/24/2012 1540pm

## 2012-02-25 DIAGNOSIS — F17200 Nicotine dependence, unspecified, uncomplicated: Secondary | ICD-10-CM

## 2012-02-25 DIAGNOSIS — M549 Dorsalgia, unspecified: Secondary | ICD-10-CM

## 2012-02-25 DIAGNOSIS — R197 Diarrhea, unspecified: Secondary | ICD-10-CM

## 2012-02-25 DIAGNOSIS — R7989 Other specified abnormal findings of blood chemistry: Secondary | ICD-10-CM

## 2012-02-25 DIAGNOSIS — G8929 Other chronic pain: Secondary | ICD-10-CM

## 2012-02-25 LAB — BASIC METABOLIC PANEL
Calcium: 8.9 mg/dL (ref 8.4–10.5)
Creatinine, Ser: 0.73 mg/dL (ref 0.50–1.10)
GFR calc non Af Amer: >90 mL/min (ref >90)
Sodium: 142 mEq/L (ref 135–145)

## 2012-02-25 LAB — CK TOTAL AND CKMB (NOT AT ARMC)
CK, MB: 2 ng/mL (ref 0.3–4.0)
Relative Index: INVALID (ref 0.0–2.5)

## 2012-02-25 LAB — CBC
HCT: 29 % — ABNORMAL LOW (ref 36.0–46.0)
HCT: 31.8 % — ABNORMAL LOW (ref 36.0–46.0)
MCH: 31.9 pg (ref 26.0–34.0)
MCHC: 34.5 g/dL (ref 30.0–36.0)
MCV: 93.8 fL (ref 78.0–100.0)
Platelets: 175 10*3/uL (ref 150–400)
Platelets: 165 10*3/uL (ref 150–400)
RDW: 13.5 % (ref 11.5–15.5)
RDW: 13.5 % (ref 11.5–15.5)
WBC: 3.9 10*3/uL — ABNORMAL LOW (ref 4.0–10.5)

## 2012-02-25 LAB — LIPID PANEL
HDL: 33 mg/dL — ABNORMAL LOW (ref >39)
LDL Cholesterol: 61 mg/dL (ref 0–99)
Total CHOL/HDL Ratio: 3.4 RATIO
Triglycerides: 83 mg/dL (ref <150)
VLDL: 17 mg/dL (ref 0–40)

## 2012-02-25 LAB — TROPONIN I: Troponin I: <0.3 ng/mL (ref <0.30)

## 2012-02-25 LAB — MAGNESIUM: Magnesium: 1.4 mg/dL — ABNORMAL LOW (ref 1.5–2.5)

## 2012-02-25 MED ORDER — MAGNESIUM SULFATE 40 MG/ML IJ SOLN
2.0000 g | Freq: Once | INTRAMUSCULAR | Status: AC
  Administered 2012-02-25: 2 g via INTRAVENOUS
  Filled 2012-02-25: qty 50

## 2012-02-25 MED ORDER — POTASSIUM CHLORIDE CRYS ER 20 MEQ PO TBCR
40.0000 meq | EXTENDED_RELEASE_TABLET | Freq: Once | ORAL | Status: AC
  Administered 2012-02-25: 40 meq via ORAL
  Filled 2012-02-25: qty 2

## 2012-02-25 NOTE — Progress Notes (Signed)
  Echocardiogram 2D Echocardiogram has been performed.  [name] 02/25/2012, 12:02 PM 

## 2012-02-25 NOTE — ED Provider Notes (Signed)
Medical screening examination/treatment/procedure(s) were performed by non-physician practitioner and as supervising physician I was immediately available for consultation/collaboration.   Benny Lennert, MD 02/25/12 [phone]

## 2012-02-25 NOTE — Progress Notes (Signed)
ANTICOAGULATION CONSULT NOTE - Follow Up Consult  Pharmacy Consult for heparin Indication: chest pain/ACS  Labs:  Basename 02/25/12 0929 02/25/12 0919 02/25/12 0045 02/24/12 1834 02/24/12 1752 02/24/12 1740 02/24/12 1424  HGB 10.0* -- -- -- 13.9 -- --  HCT 29.0* -- -- -- 41.0 -- 39.5  PLT 175 -- -- -- -- -- PLATELET CLUMPS NOTED ON SMEAR, COUNT APPEARS ADEQUATE  APTT -- -- -- -- -- 36 --  LABPROT -- -- -- -- -- 13.1 --  INR -- -- -- -- -- 1.00 --  HEPARINUNFRC 0.51 -- 0.27* -- -- -- --  CREATININE 0.73 -- -- -- 1.00 0.94 --  CKTOTAL -- 35 -- 52 -- -- --  CKMB -- 2.0 -- 1.6 -- -- --  TROPONINI -- <0.30 -- -- -- <0.30 --    Assessment: 61yo female who is now therapeutic on Heparin for CP.  Note her hemoglobin has trended down from admission.  Goal of Therapy:  Heparin level 0.3-0.7 units/ml   Plan:  Continue Heparin at 800 units/hr Recheck Heparin level in 6 hours to confirm. Check CBC in 6 hours to monitor Hgb trend.  Estella Husk, Pharm.D., BCPS Clinical Pharmacist  Phone [phone] Pager [phone] 02/25/2012, 12:30 PM

## 2012-02-25 NOTE — Progress Notes (Signed)
[facility] PROGRESS NOTE  [name] MVH:[id number] DOB: 07-Nov-1950 DOA: 02/24/2012 PCP: Quentin Mulling, MD  HPI/Subjective: Denies any diarrhea   Assessment/Plan:  Chest pain -Has 3 consecutive negative sets of troponins. -Chest pain is reproducible, likely to be musculoskeletal pain. -Seen by cardiology and recommended outpatient stress test. -Continue pain management.  Diarrhea  Resolved, Does not have any BM since she is been in the hospital. -Stool studies ordered but can not be completed because of no BMs  Chronic back pain -Continue home meds.  Homelessness -CSW and case management to help.  Code Status: Full Family Communication:  Disposition Plan: Transfer to Tele   Consultants:  [facility] cards  Procedures:  None  Antibiotics:  None   Objective: Filed Vitals:   02/25/12 1530 02/25/12 1600 02/25/12 1623 02/25/12 1630  BP: 104/62 124/76 124/76 102/66  Pulse: 91 83 80 80  Temp:   98.4 F (36.9 C)   TempSrc:   Oral   Resp: 19 17 19 20   Height:      Weight:      SpO2: 99% 98% 98% 98%    Intake/Output Summary (Last 24 hours) at 02/25/12 1746 Last data filed at 02/25/12 1600  Gross per 24 hour  Intake   2654 ml  Output      0 ml  Net   2654 ml   Filed Weights   02/24/12 1658  Weight: 48.988 kg (108 lb)    Exam:  General: Alert and awake, oriented x3, not in any acute distress. HEENT: anicteric sclera, pupils reactive to light and accommodation, EOMI CVS: S1-S2 clear, no murmur rubs or gallops Chest: clear to auscultation bilaterally, no wheezing, rales or rhonchi Abdomen: soft nontender, nondistended, normal bowel sounds, no organomegaly Extremities: no cyanosis, clubbing or edema noted bilaterally Neuro: Cranial nerves II-XII intact, no focal neurological deficits  Data Reviewed: Basic Metabolic Panel:  Lab 02/25/12 8413 02/24/12 1752 02/24/12 1740  NA 142 141 140  K 3.8 3.3* 3.2*  CL 109 103 102  CO2 24 -- 29   GLUCOSE 95 92 93  BUN 19 22 20   CREATININE 0.73 1.00 0.94  CALCIUM 8.9 -- 10.6*  MG 1.4* -- --  PHOS -- -- --   Liver Function Tests:  Lab 02/24/12 1740  AST 32  ALT 26  ALKPHOS 67  BILITOT 0.4  PROT 7.7  ALBUMIN 4.1   No results found for this basename: LIPASE:5,AMYLASE:5 in the last 168 hours No results found for this basename: AMMONIA:5 in the last 168 hours CBC:  Lab 02/25/12 0929 02/24/12 1752 02/24/12 1424  WBC 3.9* -- 4.8  NEUTROABS -- -- --  HGB 10.0* 13.9 13.7  HCT 29.0* 41.0 39.5  MCV 93.2 -- 92.9  PLT 175 -- PLATELET CLUMPS NOTED ON SMEAR, COUNT APPEARS ADEQUATE   Cardiac Enzymes:  Lab 02/25/12 1503 02/25/12 0919 02/24/12 1834 02/24/12 1740  CKTOTAL -- 35 52 --  CKMB -- 2.0 1.6 --  CKMBINDEX -- -- -- --  TROPONINI <0.30 <0.30 -- <0.30   BNP (last 3 results) No results found for this basename: PROBNP:3 in the last 8760 hours CBG: No results found for this basename: GLUCAP:5 in the last 168 hours  Recent Results (from the past 240 hour(s))  MRSA PCR SCREENING     Status: Normal   Collection Time   02/24/12 11:28 PM      Component Value Range Status Comment   MRSA by PCR NEGATIVE  NEGATIVE Final  Studies: Dg Chest 2 View  02/24/2012  *RADIOLOGY REPORT*  Clinical Data: Chest pain  CHEST - 2 VIEW  Comparison: 06/18/2011  Findings: Small irregular nodular density projects lateral to the cardiac apex.  Minimal subsegmental atelectasis at the right base. Upper thoracic Harrington rods are stable.  No pneumothorax.  No pleural effusion.  IMPRESSION: New nodular density at the left base.  Attention on short-term follow-up chest film is recommended.  If the abnormality fails to resolve, CT can be performed to further delineate.   Original Report Authenticated By: Jolaine Click, M.D.     Scheduled Meds:   . aspirin EC  325 mg Oral Daily  . gabapentin  300 mg Oral BID  . nicotine  7 mg Transdermal Daily  . potassium chloride  40 mEq Oral Once    Continuous Infusions:   . sodium chloride 75 mL/hr at 02/24/12 2112    Principal Problem:  *Chest pain Active Problems:  Hypertension  Depression  Chronic back pain  Elevated troponin  Tobacco use  Diarrhea  Pulmonary nodule seen on imaging study    Time spent: 35 minutes    [facility] A  [facility] Pager [phone]. If 8PM-8AM, please contact night-coverage at [email or web address], password [facility] 02/25/2012, 5:46 PM  LOS: 1 day

## 2012-02-25 NOTE — Care Management Note (Signed)
  Page 1 of 1   02/25/2012     4:01:13 PM   CARE MANAGEMENT NOTE 02/25/2012  Patient:  [name], [name]   Account Number:  [id number]  Date Initiated:  02/25/2012  Documentation initiated by:  [name] Assessment:   61 yr-old female adm with chest pain     In-house referral  Clinical Social Worker      DC Associate Professor  CM consult      Per UR Regulation:  Reviewed for med. necessity/level of care/duration of stay  Comments:  02/25/12 1541 Morgan Einstein RN BSN MSN CCM Received referral to provide funds for pt's medications. Pt has Medicare and Medicaid, states her usual meds are Percocet, Valium, and Neurontin.  Pt gets meds filled @ [facility] [place].  Per pharmacist, her copay for neurontin is $1.15 and cannot be waived. Pt presented to [facility] ED stating she was homeless, niece that she was living with asked her to leave.  Per documentation, CM and SW @ [facility] provided pt with multiple resources: Per CM documentation: " CM reviewed [employer] resource(to assist with housing, Lexapro outreach application for medication assistance from the outreach drug company, local [place] churches (to assist with funds), [place] housing information and [employer] with pt (to assist with medications).  Left pt with written information resources at her bedside."  Per CSW documentation: "CSW provided patient with shelter list, and outpatient psychiatry and counseling list, as patient takes lexapro."

## 2012-02-25 NOTE — Progress Notes (Signed)
ANTICOAGULATION CONSULT NOTE - Follow Up Consult  Pharmacy Consult for heparin Indication: chest pain/ACS  Labs:  Basename 02/25/12 0045 02/24/12 1834 02/24/12 1752 02/24/12 1740 02/24/12 1424  HGB -- -- 13.9 -- 13.7  HCT -- -- 41.0 -- 39.5  PLT -- -- -- -- PLATELET CLUMPS NOTED ON SMEAR, COUNT APPEARS ADEQUATE  APTT -- -- -- 36 --  LABPROT -- -- -- 13.1 --  INR -- -- -- 1.00 --  HEPARINUNFRC 0.27* -- -- -- --  CREATININE -- -- 1.00 0.94 --  CKTOTAL -- 52 -- -- --  CKMB -- 1.6 -- -- --  TROPONINI -- -- -- <0.30 --    Assessment: 61yo female slightly subtherapeutic on heparin with initial dosing for CP.  Goal of Therapy:  Heparin level 0.3-0.7 units/ml   Plan:  Will increase heparin gtt by 2 units/kg/hr to 800 units/hr and check level in 8hr.  Colleen Can PharmD BCPS 02/25/2012,1:49 AM

## 2012-02-25 NOTE — Progress Notes (Signed)
2D echo reviewed.  LVF is normal with no regional wall motion abnormalities.  Second set of cardiac enzymes are normal.  If 3rd set of enzymes are normal no further inpatient cardiac workup and can set up for outpatient nuclear stress test.  Patient's pain is very atypical and is consistent with musculoskeletal origin and is reproducible with palpation.  D-Dimer is negative.

## 2012-02-25 NOTE — Progress Notes (Addendum)
SUBJECTIVE:  Still complains of sharp and pins and needles pain over left chest, worse with deep breathing and movement of left arm and palpation over left chest wall  OBJECTIVE:   Vitals:   Filed Vitals:   02/25/12 0500 02/25/12 0600 02/25/12 0700 02/25/12 0746  BP: 92/59 91/57 97/65    Pulse: 65 61 65   Temp:    98.1 F (36.7 C)  TempSrc:    Oral  Resp: 16 11 16    Height:      Weight:      SpO2: 98% 99% 99%    I&O's:   Intake/Output Summary (Last 24 hours) at 02/25/12 0845 Last data filed at 02/25/12 0700  Gross per 24 hour  Intake  811.5 ml  Output      0 ml  Net  811.5 ml   TELEMETRY: Reviewed telemetry pt in NSR:     PHYSICAL EXAM General: Well developed, well nourished, in no acute distress Head: Eyes PERRLA, No xanthomas.   Normal cephalic and atramatic  Lungs:   Clear bilaterally to auscultation and percussion. Heart:   HRRR S1 S2 Pulses are 2+ & equal. Chest Wall:  Tender to palpation over left chest wall Abdomen: Bowel sounds are positive, abdomen soft and non-tender without masses  Extremities:   No clubbing, cyanosis or edema.  DP +1 Neuro: Alert and oriented X 3. Psych:  Good affect, responds appropriately   LABS: Basic Metabolic Panel:  Basename 02/24/12 1752 02/24/12 1740  NA 141 140  K 3.3* 3.2*  CL 103 102  CO2 -- 29  GLUCOSE 92 93  BUN 22 20  CREATININE 1.00 0.94  CALCIUM -- 10.6*  MG -- --  PHOS -- --   Liver Function Tests:  [facility] 02/24/12 1740  AST 32  ALT 26  ALKPHOS 67  BILITOT 0.4  PROT 7.7  ALBUMIN 4.1   No results found for this basename: LIPASE:2,AMYLASE:2 in the last 72 hours CBC:  Basename 02/24/12 1752 02/24/12 1424  WBC -- 4.8  NEUTROABS -- --  HGB 13.9 13.7  HCT 41.0 39.5  MCV -- 92.9  PLT -- PLATELET CLUMPS NOTED ON SMEAR, COUNT APPEARS ADEQUATE   Cardiac Enzymes:  Basename 02/24/12 1834 02/24/12 1740  CKTOTAL 52 --  CKMB 1.6 --  CKMBINDEX -- --  TROPONINI -- <0.30   BNP: No components found with  this basename: POCBNP:3 D-Dimer: No results found for this basename: DDIMER:2 in the last 72 hours Hemoglobin A1C: No results found for this basename: HGBA1C in the last 72 hours Fasting Lipid Panel:  Basename 02/25/12 0630  CHOL 111  HDL 33*  LDLCALC 61  TRIG 83  CHOLHDL 3.4  LDLDIRECT --   Thyroid Function Tests: No results found for this basename: TSH,T4TOTAL,FREET3,T3FREE,THYROIDAB in the last 72 hours Anemia Panel: No results found for this basename: VITAMINB12,FOLATE,FERRITIN,TIBC,IRON,RETICCTPCT in the last 72 hours Coag Panel:   Lab Results  Component Value Date   INR 1.00 02/24/2012    RADIOLOGY: Dg Chest 2 View  02/24/2012  *RADIOLOGY REPORT*  Clinical Data: Chest pain  CHEST - 2 VIEW  Comparison: 06/18/2011  Findings: Small irregular nodular density projects lateral to the cardiac apex.  Minimal subsegmental atelectasis at the right base. Upper thoracic Harrington rods are stable.  No pneumothorax.  No pleural effusion.  IMPRESSION: New nodular density at the left base.  Attention on short-term follow-up chest film is recommended.  If the abnormality fails to resolve, CT can be performed to further delineate.  Original Report Authenticated By: Jolaine Click, M.D.    ASSESSMENT: 1.  Atypical chest pain that seems mainly to be chest wall pain.  Aggravated by palpation of chest wall and left arm movement.  I wonder if she could be developing shingles.  Initial cardiac troponin negative but 1 set of cardiac enzymes normal.  EKG with no ischemia. 2.  HTN 3.  Left lung density  PLAN: 1.  Check 2D echo to evaluate LVF 2.  Continue to cycle enzymes 3.  If enzymes normal and echo with normal LVF  Then ok to d/c home with outpatient nuclear stress test 4.  Check D-Dimer    Quintella Reichert, MD  02/25/2012  8:45 AM

## 2012-02-26 LAB — BASIC METABOLIC PANEL
BUN: 18 mg/dL (ref 6–23)
Calcium: 8.8 mg/dL (ref 8.4–10.5)
GFR calc Af Amer: 79 mL/min — ABNORMAL LOW (ref >90)
GFR calc non Af Amer: 69 mL/min — ABNORMAL LOW (ref >90)
Potassium: 4.2 mEq/L (ref 3.5–5.1)
Sodium: 143 mEq/L (ref 135–145)

## 2012-02-26 LAB — CBC
MCH: 32 pg (ref 26.0–34.0)
MCHC: 33.6 g/dL (ref 30.0–36.0)
RDW: 13.8 % (ref 11.5–15.5)

## 2012-02-26 MED ORDER — GABAPENTIN 300 MG PO CAPS: 300.0000 mg | ORAL_CAPSULE | Freq: Two times a day (BID) | ORAL | Status: DC

## 2012-02-26 MED ORDER — OXYCODONE-ACETAMINOPHEN 10-325 MG PO TABS: 0.5000 | ORAL_TABLET | Freq: Four times a day (QID) | ORAL | Status: DC | PRN

## 2012-02-26 MED ORDER — HYDROCHLOROTHIAZIDE 12.5 MG PO CAPS: 12.5000 mg | ORAL_CAPSULE | Freq: Every day | ORAL | Status: DC

## 2012-02-26 NOTE — Progress Notes (Signed)
SUBJECTIVE:  Still complains of sharp chest pain  OBJECTIVE:   Vitals:   Filed Vitals:   02/25/12 1730 02/25/12 1800 02/25/12 1900 02/26/12 0634  BP: 107/66 114/65 110/71 96/54  Pulse: 80 78 80 61  Temp:   98 F (36.7 C) 97.9 F (36.6 C)  TempSrc:   Oral Oral  Resp: 17 17 18    Height:   5\' 2"  (1.575 m)   Weight:   47.764 kg (105 lb 4.8 oz) 47.628 kg (105 lb)  SpO2: 99% 99% 99% 100%   I&O's:   Intake/Output Summary (Last 24 hours) at 02/26/12 0947 Last data filed at 02/26/12 0700  Gross per 24 hour  Intake 1661.5 ml  Output    700 ml  Net  961.5 ml   TELEMETRY: Reviewed telemetry pt in NSR:     PHYSICAL EXAM General: Well developed, well nourished, in no acute distress Head: Eyes PERRLA, No xanthomas.   Normal cephalic and atramatic  Lungs:   Clear bilaterally to auscultation and percussion. Heart:   HRRR S1 S2 Pulses are 2+ & equal. Abdomen: Bowel sounds are positive, abdomen soft and non-tender without masses  Extremities:   No clubbing, cyanosis or edema.  DP +1 Neuro: Alert and oriented X 3. Psych:  Good affect, responds appropriately   LABS: Basic Metabolic Panel:  Basename 02/26/12 0437 02/25/12 0929  NA 143 142  K 4.2 3.8  CL 112 109  CO2 26 24  GLUCOSE 107* 95  BUN 18 19  CREATININE 0.89 0.73  CALCIUM 8.8 8.9  MG 1.8 1.4*  PHOS -- --   Liver Function Tests:  [facility] 02/24/12 1740  AST 32  ALT 26  ALKPHOS 67  BILITOT 0.4  PROT 7.7  ALBUMIN 4.1   No results found for this basename: LIPASE:2,AMYLASE:2 in the last 72 hours CBC:  Basename 02/26/12 0437 02/25/12 1722  WBC 4.1 5.9  NEUTROABS -- --  HGB 10.3* 10.8*  HCT 30.7* 31.8*  MCV 95.3 93.8  PLT 157 165   Cardiac Enzymes:  Basename 02/25/12 2026 02/25/12 1503 02/25/12 0919 02/24/12 1834  CKTOTAL -- -- 35 52  CKMB -- -- 2.0 1.6  CKMBINDEX -- -- -- --  TROPONINI <0.30 <0.30 <0.30 --   BNP: No components found with this basename: POCBNP:3 D-Dimer:  [facility] 02/25/12 0929   DDIMER <0.27   Hemoglobin A1C: No results found for this basename: HGBA1C in the last 72 hours Fasting Lipid Panel:  Basename 02/25/12 0630  CHOL 111  HDL 33*  LDLCALC 61  TRIG 83  CHOLHDL 3.4  LDLDIRECT --   Thyroid Function Tests: No results found for this basename: TSH,T4TOTAL,FREET3,T3FREE,THYROIDAB in the last 72 hours Anemia Panel: No results found for this basename: VITAMINB12,FOLATE,FERRITIN,TIBC,IRON,RETICCTPCT in the last 72 hours Coag Panel:   Lab Results  Component Value Date   INR 1.00 02/24/2012    RADIOLOGY: Dg Chest 2 View  02/24/2012  *RADIOLOGY REPORT*  Clinical Data: Chest pain  CHEST - 2 VIEW  Comparison: 06/18/2011  Findings: Small irregular nodular density projects lateral to the cardiac apex.  Minimal subsegmental atelectasis at the right base. Upper thoracic Harrington rods are stable.  No pneumothorax.  No pleural effusion.  IMPRESSION: New nodular density at the left base.  Attention on short-term follow-up chest film is recommended.  If the abnormality fails to resolve, CT can be performed to further delineate.   Original Report Authenticated By: Jolaine Click, M.D.    ASSESSMENT:  1. Atypical chest pain that  seems mainly to be chest wall pain. Aggravated by palpation of chest wall and left arm movement. I wonder if she could be developing shingles. Initial cardiac troponin negative but all other cardiac enzymes normal. EKG with no ischemia. Normal LVF on echo. D-Dimer negative 2. HTN  3. Left lung density   PLAN:  1.  NPO after midnight 2.  Since there are issues with home situation I think it would be best to get a Lexiscan Cardiolyte prior to d/c due to concerns of patient following up as outpatient.  Will plan for stress test in am.        Quintella Reichert, MD  02/26/2012  9:47 AM

## 2012-02-26 NOTE — Progress Notes (Signed)
[facility] PROGRESS NOTE  [name]:[id number] DOB: [birth year] DOA: 02/24/2012 PCP: Quentin Mulling, MD  HPI/Subjective: Still complaining about left-sided chest pain.   Assessment/Plan:  Chest pain -Has 3 consecutive negative sets of troponins. -Chest pain is reproducible, likely to be musculoskeletal pain. -Seen by Dr. Mayford Knife of [facility] cardiology today, recommended stress test to be done in a.m. -If stress test is negative she can be discharged in a.m.  Diarrhea  Resolved, Does not have any BM since she is been in the hospital. -Stool studies ordered but can not be completed because of no BMs  Chronic back pain -Continue home meds.  Homelessness -CSW and case management to help.  Code Status: Full Family Communication:  Disposition Plan: Continue telemetry monitoring   Consultants:  [facility] cards  Procedures:  None  Antibiotics:  None   Objective: Filed Vitals:   02/25/12 1730 02/25/12 1800 02/25/12 1900 02/26/12 0634  BP: 107/66 114/65 110/71 96/54  Pulse: 80 78 80 61  Temp:   98 F (36.7 C) 97.9 F (36.6 C)  TempSrc:   Oral Oral  Resp: 17 17 18    Height:   5\' 2"  (1.575 m)   Weight:   47.764 kg (105 lb 4.8 oz) 47.628 kg (105 lb)  SpO2: 99% 99% 99% 100%    Intake/Output Summary (Last 24 hours) at 02/26/12 1020 Last data filed at 02/26/12 0700  Gross per 24 hour  Intake   1571 ml  Output    700 ml  Net    871 ml   Filed Weights   02/24/12 1658 02/25/12 1900 02/26/12 0634  Weight: 48.988 kg (108 lb) 47.764 kg (105 lb 4.8 oz) 47.628 kg (105 lb)    Exam:  General: Alert and awake, oriented x3, not in any acute distress. HEENT: anicteric sclera, pupils reactive to light and accommodation, EOMI CVS: S1-S2 clear, no murmur rubs or gallops Chest: clear to auscultation bilaterally, no wheezing, rales or rhonchi Abdomen: soft nontender, nondistended, normal bowel sounds, no organomegaly Extremities: no cyanosis, clubbing or edema  noted bilaterally Neuro: Cranial nerves II-XII intact, no focal neurological deficits  Data Reviewed: Basic Metabolic Panel:  Lab 02/26/12 8119 02/25/12 0929 02/24/12 1752 02/24/12 1740  NA 143 142 141 140  K 4.2 3.8 3.3* 3.2*  CL 112 109 103 102  CO2 26 24 -- 29  GLUCOSE 107* 95 92 93  BUN 18 19 22 20   CREATININE 0.89 0.73 1.00 0.94  CALCIUM 8.8 8.9 -- 10.6*  MG 1.8 1.4* -- --  PHOS -- -- -- --   Liver Function Tests:  Lab 02/24/12 1740  AST 32  ALT 26  ALKPHOS 67  BILITOT 0.4  PROT 7.7  ALBUMIN 4.1   No results found for this basename: LIPASE:5,AMYLASE:5 in the last 168 hours No results found for this basename: AMMONIA:5 in the last 168 hours CBC:  Lab 02/26/12 0437 02/25/12 1722 02/25/12 0929 02/24/12 1752 02/24/12 1424  WBC 4.1 5.9 3.9* -- 4.8  NEUTROABS -- -- -- -- --  HGB 10.3* 10.8* 10.0* 13.9 13.7  HCT 30.7* 31.8* 29.0* 41.0 39.5  MCV 95.3 93.8 93.2 -- 92.9  PLT 157 165 175 -- PLATELET CLUMPS NOTED ON SMEAR, COUNT APPEARS ADEQUATE   Cardiac Enzymes:  Lab 02/25/12 2026 02/25/12 1503 02/25/12 0919 02/24/12 1834 02/24/12 1740  CKTOTAL -- -- 35 52 --  CKMB -- -- 2.0 1.6 --  CKMBINDEX -- -- -- -- --  TROPONINI <0.30 <0.30 <0.30 -- <0.30  BNP (last 3 results) No results found for this basename: PROBNP:3 in the last 8760 hours CBG: No results found for this basename: GLUCAP:5 in the last 168 hours  Recent Results (from the past 240 hour(s))  MRSA PCR SCREENING     Status: Normal   Collection Time   02/24/12 11:28 PM      Component Value Range Status Comment   MRSA by PCR NEGATIVE  NEGATIVE Final      Studies: Dg Chest 2 View  02/24/2012  *RADIOLOGY REPORT*  Clinical Data: Chest pain  CHEST - 2 VIEW  Comparison: 06/18/2011  Findings: Small irregular nodular density projects lateral to the cardiac apex.  Minimal subsegmental atelectasis at the right base. Upper thoracic Harrington rods are stable.  No pneumothorax.  No pleural effusion.  IMPRESSION: New  nodular density at the left base.  Attention on short-term follow-up chest film is recommended.  If the abnormality fails to resolve, CT can be performed to further delineate.   Original Report Authenticated By: Jolaine Click, M.D.     Scheduled Meds:    . aspirin EC  325 mg Oral Daily  . gabapentin  300 mg Oral BID  . nicotine  7 mg Transdermal Daily   Continuous Infusions:   Principal Problem:  *Chest pain Active Problems:  Hypertension  Depression  Chronic back pain  Elevated troponin  Tobacco use  Diarrhea  Pulmonary nodule seen on imaging study    Time spent: 35 minutes    [facility]  [facility] Pager [phone]. If 8PM-8AM, please contact night-coverage at [email or web address], password [facility] 02/26/2012, 10:20 AM  LOS: 2 days

## 2012-02-27 ENCOUNTER — Inpatient Hospital Stay (HOSPITAL_COMMUNITY): Payer: Medicare Other

## 2012-02-27 LAB — CBC
MCH: 32.1 pg (ref 26.0–34.0)
MCV: 94.7 fL (ref 78.0–100.0)
Platelets: 150 10*3/uL (ref 150–400)
RBC: 3.18 MIL/uL — ABNORMAL LOW (ref 3.87–5.11)
RDW: 13.5 % (ref 11.5–15.5)
WBC: 4.3 10*3/uL (ref 4.0–10.5)

## 2012-02-27 MED ORDER — TECHNETIUM TC 99M SESTAMIBI GENERIC - CARDIOLITE
30.0000 | Freq: Once | INTRAVENOUS | Status: AC | PRN
  Administered 2012-02-27: 30 via INTRAVENOUS

## 2012-02-27 MED ORDER — REGADENOSON 0.4 MG/5ML IV SOLN
0.4000 mg | Freq: Once | INTRAVENOUS | Status: AC
  Administered 2012-02-27: 0.4 mg via INTRAVENOUS
  Filled 2012-02-27: qty 5

## 2012-02-27 MED ORDER — TECHNETIUM TC 99M SESTAMIBI GENERIC - CARDIOLITE
10.0000 | Freq: Once | INTRAVENOUS | Status: AC | PRN
  Administered 2012-02-27: 10 via INTRAVENOUS

## 2012-02-27 NOTE — Progress Notes (Signed)
Pt discharged to home per MD order.  Pt received and reviewed all discharge instructions and medication information including follow-up appointment information and prescriptions. RN attempted to call to schedule appointments but offices were closed due to holiday.  Pt verbalized understanding and confirmed that she will make her appointments. Pt alert and oriented at discharge with no complaints of pain.  Pt given bus pass at discharge per social work.  Pt escorted to bus stop via wheelchair by nurse tech.  [name]

## 2012-02-27 NOTE — Progress Notes (Signed)
Chaplain visited patient at her own request through nurse. Patient appeared to be stressed out. Her relationship with family are not good. Chaplain provided ministry of presence and empathic listening. Chaplain shared words of hope, encouragement and prayed for patient. Chaplain will follow-up as needed. Patient thanked Chaplain for the prompt response and the spiritual support received.

## 2012-02-27 NOTE — Progress Notes (Signed)
Clinical Social Worker met with pt at bedside.  CSW introduced self, explained role, and provided support.  CSW provided active listening.  CSW validated pt's feelings. CSW and pt problem solved.  Pt appeared to communicate openly with regards to her finances and available resources.  Pt and CSW reviewed list of low income housing.  Pt currently working with [facility].  Pt willing to stay in a shelter until beginning of month when apartment will be available and her disability check when be deposited into her account.  Pt provided contact information to [place] transportation [place] bus routes.  Pt provided with 20$ from petty cash to assist with gas money to get to shelters/apartment.Pt thanked CSW for intervention.  CSW to sign off at this time, please re consult if needed.   Angelia Mould, MSW, [place] [phone]

## 2012-02-27 NOTE — Discharge Summary (Signed)
Physician Discharge Summary  [name] NWG:[id number] DOB: Dec 21, 1950 DOA: 02/24/2012  PCP: Quentin Mulling, MD  Admit date: 02/24/2012 Discharge date: 02/27/2012  Time spent: 40 minutes  Recommendations for Outpatient Follow-up:  1. followup with primary care physician  Discharge Diagnoses:  Principal Problem:  *Chest pain Active Problems:  Hypertension  Depression  Chronic back pain  Elevated troponin  Tobacco use  Diarrhea  Pulmonary nodule seen on imaging study   Discharge Condition: stable  Diet recommendation: regular  Filed Weights   02/25/12 1900 02/26/12 0634 02/27/12 0446  Weight: 47.764 kg (105 lb 4.8 oz) 47.628 kg (105 lb) 49.442 kg (109 lb)    History of present illness:  [name] is a 61 y.o. Caucasian female with history of hypertension, chronic back pain and has had back surgeries, depression, and tobacco use who presents with the above complaints. Patient reports that she has currently been homeless but has been living with family recently. She had left-sided chest pain which started yesterday afternoon at 1-2 p.m., she has had persistent sharp pain which has not subsided. She reports having pain running down her left arm. She's not sure if the pain improved during the sleep but she woke up with the pain as a result she presented to the emergency department for further evaluation. In the emergency department her point-of-care troponin was mildly elevated at 0.22 as a result the hospitalist service was asked to admit the patient for further care and management. Patient denies any recent fevers, chills, nausea, vomiting, abdominal pain, headaches or vision changes. She does complain of intermittent episodes of shortness of breath, currently denies any shortness of breath. Denies any cough. She does report having diarrhea since November, reports having about 8 bowel movements a day which is watery in nature. She has a long-standing history of chronic back  pain and takes opiates for pain management. Currently she continues to complain of chest pain, 7/10. Denies having any sweating with this pain.    Hospital Course:   1. Chest pain: Patient came in to the hospital complaining about chest pain, the chest pain is atypical because it has some reproducible component to it. At initial evaluation in the emergency department POC troponin wasslightly positive. So cardiology was consulted, as mentioned above patient was [facility] then [facility] was [facility] to [facility] for visit better access of the cardiology floor. Patient has 3 sets of cardiac enzymes negative, her EKG did not show ischemic findings, she seen by Dr. Mayford Knife and she recommended outpatient stress test initially the patient stayed in the hospital and she had on 02/27/2012. The test was negative for ischemia. Her chest pain is likely musculoskeletal as mentioned above as it was reproducible and increases with movement of the left arm.  2. Diarrhea: Loose bowel movements resolved, stool studies were ordered but cannot be completed because patient did not have bowel movements while she is in the hospital.  3. Chronic back pain: Patient is on chronic narcotics for her chronic back pain. Patient follows with her primary care physician on that. Refill for her Percocet was given for only 30 pills.  4. Homelessness: The patient seen by case management and clinical social worker, appropriate community resources were given to the patient.  Procedures:  MYOCARDIAL IMAGING WITH SPECT (REST AND PHARMACOLOGIC-STRESS)  GATED LEFT VENTRICULAR WALL MOTION STUDY  LEFT VENTRICULAR EJECTION FRACTION  Technique: Standard myocardial SPECT imaging was performed after  resting intravenous injection of 10 mCi  Tc-12m sestamibi.  Subsequently, intravenous infusion of lexiscan was performed under  the supervision of the Cardiology staff. At peak effect of the  drug, 30 mCi  Tc-17m sestamibi was injected intravenously and  standard myocardial SPECT imaging was performed. Quantitative  gated imaging was also performed to evaluate left ventricular wall  motion, and estimate left ventricular ejection fraction.  Comparison: Two-view chest x-ray 02/24/2012.   Findings: Normal myocardial uptake is present. There are no  perfusion defects. Contractility and wall motion is within normal  limits.  The estimated diastolic volume is 49 ml. Estimated systolic volume  is 80 ml. The calculated ejection fraction is 84%.  IMPRESSION:  1. Normal nuclear medicine myocardial perfusion exam without  evidence for reversible ischemia or wall motion abnormality.  2. Normal cardiac function and ejection fraction.    Consultations:  Carolanne Grumbling of [facility] cardiology  Discharge Exam: Filed Vitals:   02/27/12 0911 02/27/12 0913 02/27/12 0915 02/27/12 1356  BP: 129/71 115/72 114/73 117/72  Pulse: 98 97 94 80  Temp:    98 F (36.7 C)  TempSrc:      Resp: 16 16 16 18   Height:      Weight:      SpO2:    100%   General: Alert and awake, oriented x3, not in any acute distress. HEENT: anicteric sclera, pupils reactive to light and accommodation, EOMI CVS: S1-S2 clear, no murmur rubs or gallops Chest: clear to auscultation bilaterally, no wheezing, rales or rhonchi Abdomen: soft nontender, nondistended, normal bowel sounds, no organomegaly Extremities: no cyanosis, clubbing or edema noted bilaterally Neuro: Cranial nerves II-XII intact, no focal neurological deficits  Discharge Instructions      Discharge Orders    Future Orders Please Complete By Expires   Increase activity slowly          Medication List     As of 02/27/2012  2:21 PM    TAKE these medications         acetaminophen 500 MG tablet   Commonly known as: TYLENOL   Take 500 mg by mouth every 6 (six) hours as needed. For pain      diazepam 5 MG tablet   Commonly known as: VALIUM   Take 5 mg by  mouth at bedtime as needed.      diclofenac sodium 1 % Gel   Commonly known as: VOLTAREN   Apply 1 application topically 4 (four) times daily. For back      gabapentin 300 MG capsule   Commonly known as: NEURONTIN   Take 1 capsule (300 mg total) by mouth 2 (two) times daily.      hydrochlorothiazide 12.5 MG capsule   Commonly known as: MICROZIDE   Take 1 capsule (12.5 mg total) by mouth daily.      oxyCODONE-acetaminophen 10-325 MG per tablet   Commonly known as: PERCOCET   Take 0.5 tablets by mouth every 6 (six) hours as needed. For pain        Follow-up Information    Follow up with HOOPER,JEFFREY C, MD. In 1 week.   Contact information:   [address] [place] [address] [phone]       Follow up with Quintella Reichert, MD. In 1 week.   Contact information:   [address] [place] [address] [phone]           The results of significant diagnostics from this hospitalization (including imaging, microbiology, ancillary and laboratory) are listed below  for reference.    Significant Diagnostic Studies: Dg Chest 2 View  02/24/2012  *RADIOLOGY REPORT*  Clinical Data: Chest pain  CHEST - 2 VIEW  Comparison: 06/18/2011  Findings: Small irregular nodular density projects lateral to the cardiac apex.  Minimal subsegmental atelectasis at the right base. Upper thoracic Harrington rods are stable.  No pneumothorax.  No pleural effusion.  IMPRESSION: New nodular density at the left base.  Attention on short-term follow-up chest film is recommended.  If the abnormality fails to resolve, CT can be performed to further delineate.   Original Report Authenticated By: Jolaine Click, M.D.    Nm Myocar Single W/spect W/wall Motion And Ef  02/27/2012  *RADIOLOGY REPORT*  Clinical Data:  The chest pain  MYOCARDIAL IMAGING WITH SPECT (REST AND PHARMACOLOGIC-STRESS) GATED LEFT VENTRICULAR WALL MOTION STUDY LEFT VENTRICULAR EJECTION FRACTION  Technique:  Standard  myocardial SPECT imaging was performed after resting intravenous injection of 10 mCi Tc-55m sestamibi. Subsequently, intravenous infusion of lexiscan was performed under the supervision of the Cardiology staff.  At peak effect of the drug, 30 mCi Tc-78m sestamibi was injected intravenously and standard myocardial SPECT  imaging was performed.  Quantitative gated imaging was also performed to evaluate left ventricular wall motion, and estimate left ventricular ejection fraction.  Comparison:  Two-view chest x-ray 02/24/2012.  Findings: Normal myocardial uptake is present.  There are no perfusion defects.  Contractility and wall motion is within normal limits.  The estimated diastolic volume is 49 ml.  Estimated systolic volume is 80 ml.  The calculated ejection fraction is 84%.  IMPRESSION:  1.  Normal nuclear medicine myocardial perfusion exam without evidence for reversible ischemia or wall motion abnormality. 2.  Normal cardiac function and ejection fraction.   Original Report Authenticated By: Marin Roberts, M.D.     Microbiology: Recent Results (from the past 240 hour(s))  MRSA PCR SCREENING     Status: Normal   Collection Time   02/24/12 11:28 PM      Component Value Range Status Comment   MRSA by PCR NEGATIVE  NEGATIVE Final      Labs: Basic Metabolic Panel:  Lab 02/26/12 9629 02/25/12 0929 02/24/12 1752 02/24/12 1740  NA 143 142 141 140  K 4.2 3.8 3.3* 3.2*  CL 112 109 103 102  CO2 26 24 -- 29  GLUCOSE 107* 95 92 93  BUN 18 19 22 20   CREATININE 0.89 0.73 1.00 0.94  CALCIUM 8.8 8.9 -- 10.6*  MG 1.8 1.4* -- --  PHOS -- -- -- --   Liver Function Tests:  Lab 02/24/12 1740  AST 32  ALT 26  ALKPHOS 67  BILITOT 0.4  PROT 7.7  ALBUMIN 4.1   No results found for this basename: LIPASE:5,AMYLASE:5 in the last 168 hours No results found for this basename: AMMONIA:5 in the last 168 hours CBC:  Lab 02/27/12 0530 02/26/12 0437 02/25/12 1722 02/25/12 0929 02/24/12 1752 02/24/12  1424  WBC 4.3 4.1 5.9 3.9* -- 4.8  NEUTROABS -- -- -- -- -- --  HGB 10.2* 10.3* 10.8* 10.0* 13.9 --  HCT 30.1* 30.7* 31.8* 29.0* 41.0 --  MCV 94.7 95.3 93.8 93.2 -- 92.9  PLT 150 157 165 175 -- PLATELET CLUMPS NOTED ON SMEAR, COUNT APPEARS ADEQUATE   Cardiac Enzymes:  Lab 02/25/12 2026 02/25/12 1503 02/25/12 0919 02/24/12 1834 02/24/12 1740  CKTOTAL -- -- 35 52 --  CKMB -- -- 2.0 1.6 --  CKMBINDEX -- -- -- -- --  TROPONINI <0.30 <  0.30 <0.30 -- <0.30   BNP: BNP (last 3 results) No results found for this basename: PROBNP:3 in the last 8760 hours CBG: No results found for this basename: GLUCAP:5 in the last 168 hours     Signed:  Stori Royse A  [facility] 02/27/2012, 2:21 PM

## 2012-03-04 ENCOUNTER — Encounter (HOSPITAL_COMMUNITY): Payer: Self-pay | Admitting: Adult Health

## 2012-03-04 ENCOUNTER — Emergency Department (HOSPITAL_COMMUNITY): Payer: Medicare Other

## 2012-03-04 ENCOUNTER — Emergency Department (HOSPITAL_COMMUNITY)
Admission: EM | Admit: 2012-03-04 | Discharge: 2012-03-04 | Disposition: A | Discharge status: Home / Self Care | Payer: Medicare Other | Attending: Emergency Medicine | Admitting: Emergency Medicine

## 2012-03-04 DIAGNOSIS — I10 Essential (primary) hypertension: Secondary | ICD-10-CM | POA: Insufficient documentation

## 2012-03-04 DIAGNOSIS — F17200 Nicotine dependence, unspecified, uncomplicated: Secondary | ICD-10-CM | POA: Insufficient documentation

## 2012-03-04 DIAGNOSIS — Z79899 Other long term (current) drug therapy: Secondary | ICD-10-CM | POA: Insufficient documentation

## 2012-03-04 DIAGNOSIS — Z7982 Long term (current) use of aspirin: Secondary | ICD-10-CM | POA: Insufficient documentation

## 2012-03-04 DIAGNOSIS — R079 Chest pain, unspecified: Secondary | ICD-10-CM | POA: Insufficient documentation

## 2012-03-04 LAB — D-DIMER, QUANTITATIVE: D-Dimer, Quant: 0.3 ug{FEU}/mL (ref 0.00–0.48)

## 2012-03-04 LAB — BASIC METABOLIC PANEL
BUN: 20 mg/dL (ref 6–23)
Calcium: 10.4 mg/dL (ref 8.4–10.5)
GFR calc non Af Amer: 89 mL/min — ABNORMAL LOW (ref >90)
Glucose, Bld: 118 mg/dL — ABNORMAL HIGH (ref 70–99)
Sodium: 139 mEq/L (ref 135–145)

## 2012-03-04 LAB — CBC
Hemoglobin: 13.4 g/dL (ref 12.0–15.0)
MCH: 31.8 pg (ref 26.0–34.0)
MCHC: 34.6 g/dL (ref 30.0–36.0)

## 2012-03-04 LAB — POCT I-STAT TROPONIN I: Troponin i, poc: 0 ng/mL (ref 0.00–0.08)

## 2012-03-04 MED ORDER — KETOROLAC TROMETHAMINE 30 MG/ML IJ SOLN
30.0000 mg | Freq: Once | INTRAMUSCULAR | Status: AC
  Administered 2012-03-04: 30 mg via INTRAVENOUS
  Filled 2012-03-04: qty 1

## 2012-03-04 NOTE — ED Notes (Addendum)
Presents with left sided chest pain described as sharp, numbness and tingling that radiates dowm left arm. Pain began while lying down at home watching t.v. at 3 pm. Pain is worse with deep inspiration, pain is constant, pt is tachycardic 118, smoker of 1 cigarrette per day, c/o bilateral leg tightness from knees down, no edema noted.  Nothing makes pain better. Denies nausea and SOB.

## 2012-03-04 NOTE — ED Provider Notes (Signed)
History     CSN: [id number]  Arrival date & time 03/04/12  4098   First MD Initiated Contact with Patient 03/04/12 2021      Chief Complaint  Patient presents with  . Chest Pain    (Consider location/radiation/quality/duration/timing/severity/associated sxs/prior treatment) HPI Comments: Patient presents with left-sided chest pain that started about 3 hours ago. She states it's been constant since then. She describes as a sharp deep pain in left side of her chest. It's worse with movement and worse with deep breathing. She denies any shortness of breath. She denies any leg pain or swelling. She denies any cough or chest congestion. She states the pain radiates down her left arm at times. Patient was recently in the hospital about 2 weeks ago for similar complaints and had a nuclear scan that did not show any signs of ischemic heart disease. She has a followup appointment tomorrow with Dr. Mayford Knife with cardiology.   Past Medical History  Diagnosis Date  . Hypertension     Past Surgical History  Procedure Date  . Abdominal hysterectomy   . Cholecystectomy   . Kidney stones   . Back surgery     Family History  Problem Relation Age of Onset  . Alzheimer's disease Mother   . Heart failure Mother   . Stroke Father   . Coronary artery disease Sister   . Coronary artery disease Sister   . Coronary artery disease Sister     History  Substance Use Topics  . Smoking status: Current Some Day Smoker    Types: Cigarettes  . Smokeless tobacco: Never Used  . Alcohol Use: No    OB History    Grav Para Term Preterm Abortions TAB SAB Ect Mult Living                  Review of Systems  Constitutional: Negative for fever, chills, diaphoresis and fatigue.  HENT: Negative for congestion, rhinorrhea and sneezing.   Eyes: Negative.   Respiratory: Negative for cough, chest tightness and shortness of breath.   Cardiovascular: Positive for chest pain. Negative for leg swelling.    Gastrointestinal: Negative for nausea, vomiting, abdominal pain, diarrhea and blood in stool.  Genitourinary: Negative for frequency, hematuria, flank pain and difficulty urinating.  Musculoskeletal: Negative for back pain and arthralgias.  Skin: Negative for rash.  Neurological: Negative for dizziness, speech difficulty, weakness, numbness and headaches.    Allergies  Compazine; Influenza vaccines; Metoclopramide hcl; and Zofran  Home Medications   Current Outpatient Rx  Name  Route  Sig  Dispense  Refill  . ACETAMINOPHEN 500 MG PO TABS   Oral   Take 500 mg by mouth every 6 (six) hours as needed. For pain         . ASPIRIN EC 81 MG PO TBEC   Oral   Take 81 mg by mouth daily.         Marland Kitchen DIAZEPAM 5 MG PO TABS   Oral   Take 5 mg by mouth at bedtime as needed. For sleep         . DICLOFENAC SODIUM 1 % TD GEL   Topical   Apply 1 application topically 4 (four) times daily. For back         . GABAPENTIN 300 MG PO CAPS   Oral   Take 1 capsule (300 mg total) by mouth 2 (two) times daily.   60 capsule   0   . HYDROCHLOROTHIAZIDE 12.5 MG PO CAPS  Oral   Take 1 capsule (12.5 mg total) by mouth daily.   30 capsule   0   . OXYCODONE-ACETAMINOPHEN 10-325 MG PO TABS   Oral   Take 0.5 tablets by mouth every 6 (six) hours as needed. For pain           BP 127/86  Pulse 73  Temp 98.7 F (37.1 C) (Oral)  Resp 16  Wt 105 lb (47.628 kg)  SpO2 95%  Physical Exam  Constitutional: She is oriented to person, place, and time. She appears well-developed and well-nourished.  HENT:  Head: Normocephalic and atraumatic.  Eyes: Pupils are equal, round, and reactive to light.  Neck: Normal range of motion. Neck supple.  Cardiovascular: Normal rate, regular rhythm and normal heart sounds.   Pulmonary/Chest: Effort normal and breath sounds normal. No respiratory distress. She has no wheezes. She has no rales. She exhibits tenderness (positive reproducible tenderness to  palpation of the left chest wall. No crepitus or deformity is noted.).  Abdominal: Soft. Bowel sounds are normal. There is no tenderness. There is no rebound and no guarding.  Musculoskeletal: Normal range of motion. She exhibits no edema.  Lymphadenopathy:    She has no cervical adenopathy.  Neurological: She is alert and oriented to person, place, and time.  Skin: Skin is warm and dry. No rash noted.  Psychiatric: She has a normal mood and affect.    ED Course  Procedures (including critical care time)  Results for orders placed during the hospital encounter of 03/04/12  CBC      Component Value Range   WBC 5.0  4.0 - 10.5 K/uL   RBC 4.22  3.87 - 5.11 MIL/uL   Hemoglobin 13.4  12.0 - 15.0 g/dL   HCT 16.1  09.6 - 04.5 %   MCV 91.7  78.0 - 100.0 fL   MCH 31.8  26.0 - 34.0 pg   MCHC 34.6  30.0 - 36.0 g/dL   RDW 40.9  81.1 - 91.4 %   Platelets 163  150 - 400 K/uL  BASIC METABOLIC PANEL      Component Value Range   Sodium 139  135 - 145 mEq/L   Potassium 3.4 (*) 3.5 - 5.1 mEq/L   Chloride 101  96 - 112 mEq/L   CO2 24  19 - 32 mEq/L   Glucose, Bld 118 (*) 70 - 99 mg/dL   BUN 20  6 - 23 mg/dL   Creatinine, Ser 7.82  0.50 - 1.10 mg/dL   Calcium 95.6  8.4 - 21.3 mg/dL   GFR calc non Af Amer 89 (*) >90 mL/min   GFR calc Af Amer >90  >90 mL/min  POCT I-STAT TROPONIN I      Component Value Range   Troponin i, poc 0.00  0.00 - 0.08 ng/mL   Comment 3           D-DIMER, QUANTITATIVE      Component Value Range   D-Dimer, Quant 0.30  0.00 - 0.48 ug/mL-FEU   Dg Chest 2 View  03/04/2012  *RADIOLOGY REPORT*  Clinical Data: Chest pain  CHEST - 2 VIEW  Comparison: Prior chest x-ray 02/24/2012  Findings: No significant interval change in the appearance of the chest.  Negative for pulmonary edema or focal airspace consolidation.  Unchanged pulmonary hyperexpansion consistent with underlying COPD. Left basilar nodular opacity appears unchanged dating back to July 2011 and likely represents a  nipple shadow. Cardiac and mediastinal contours within normal limits.  Posterior spine stabilization hardware is unchanged.  No acute osseous abnormality.  IMPRESSION:  No acute cardiopulmonary disease   Original Report Authenticated By: Malachy Moan, M.D.    Dg Chest 2 View  02/24/2012  *RADIOLOGY REPORT*  Clinical Data: Chest pain  CHEST - 2 VIEW  Comparison: 06/18/2011  Findings: Small irregular nodular density projects lateral to the cardiac apex.  Minimal subsegmental atelectasis at the right base. Upper thoracic Harrington rods are stable.  No pneumothorax.  No pleural effusion.  IMPRESSION: New nodular density at the left base.  Attention on short-term follow-up chest film is recommended.  If the abnormality fails to resolve, CT can be performed to further delineate.   Original Report Authenticated By: Jolaine Click, M.D.    Nm Myocar Single W/spect W/wall Motion And Ef  02/27/2012  *RADIOLOGY REPORT*  Clinical Data:  The chest pain  MYOCARDIAL IMAGING WITH SPECT (REST AND PHARMACOLOGIC-STRESS) GATED LEFT VENTRICULAR WALL MOTION STUDY LEFT VENTRICULAR EJECTION FRACTION  Technique:  Standard myocardial SPECT imaging was performed after resting intravenous injection of 10 mCi Tc-66m sestamibi. Subsequently, intravenous infusion of lexiscan was performed under the supervision of the Cardiology staff.  At peak effect of the drug, 30 mCi Tc-82m sestamibi was injected intravenously and standard myocardial SPECT  imaging was performed.  Quantitative gated imaging was also performed to evaluate left ventricular wall motion, and estimate left ventricular ejection fraction.  Comparison:  Two-view chest x-ray 02/24/2012.  Findings: Normal myocardial uptake is present.  There are no perfusion defects.  Contractility and wall motion is within normal limits.  The estimated diastolic volume is 49 ml.  Estimated systolic volume is 80 ml.  The calculated ejection fraction is 84%.  IMPRESSION:  1.  Normal nuclear  medicine myocardial perfusion exam without evidence for reversible ischemia or wall motion abnormality. 2.  Normal cardiac function and ejection fraction.   Original Report Authenticated By: Marin Roberts, M.D.      Date: 03/04/2012  Rate: 121  Rhythm: sinus tachycardia  QRS Axis: normal  Intervals: normal  ST/T Wave abnormalities: nonspecific ST/T changes  Conduction Disutrbances:none  Narrative Interpretation:   Old EKG Reviewed: unchanged, other than HR increased    1. Chest pain       MDM  Patient atypical chest pain. It is reproducible on palpation. Her d-dimer is negative and she has no other symptoms suggestive of pulmonary embolus. She had a recent nuclear stress test which did not show any reversible ischemia. She has appointment to followup tomorrow with Dr. Mayford Knife with cardiology. I did give her a dose of Toradol and she says her pain completely resolved with that. I feel this is likely musculoskeletal. I did stress importance of followup tomorrow Dr. Mayford Knife and advised to return here if her symptoms worsen.        Rolan Bucco, MD 03/04/12 2218

## 2012-03-13 ENCOUNTER — Encounter (HOSPITAL_COMMUNITY): Payer: Self-pay | Admitting: Emergency Medicine

## 2012-03-13 DIAGNOSIS — F17200 Nicotine dependence, unspecified, uncomplicated: Secondary | ICD-10-CM | POA: Insufficient documentation

## 2012-03-13 DIAGNOSIS — I10 Essential (primary) hypertension: Secondary | ICD-10-CM | POA: Insufficient documentation

## 2012-03-13 DIAGNOSIS — Z7982 Long term (current) use of aspirin: Secondary | ICD-10-CM | POA: Insufficient documentation

## 2012-03-13 DIAGNOSIS — Z9089 Acquired absence of other organs: Secondary | ICD-10-CM | POA: Insufficient documentation

## 2012-03-13 DIAGNOSIS — Z79899 Other long term (current) drug therapy: Secondary | ICD-10-CM | POA: Insufficient documentation

## 2012-03-13 DIAGNOSIS — Z87442 Personal history of urinary calculi: Secondary | ICD-10-CM | POA: Insufficient documentation

## 2012-03-13 DIAGNOSIS — Z90710 Acquired absence of both cervix and uterus: Secondary | ICD-10-CM | POA: Insufficient documentation

## 2012-03-13 DIAGNOSIS — R071 Chest pain on breathing: Secondary | ICD-10-CM | POA: Insufficient documentation

## 2012-03-13 LAB — COMPREHENSIVE METABOLIC PANEL
Albumin: 4.4 g/dL (ref 3.5–5.2)
BUN: 21 mg/dL (ref 6–23)
Creatinine, Ser: 0.77 mg/dL (ref 0.50–1.10)
Total Protein: 7.9 g/dL (ref 6.0–8.3)

## 2012-03-13 NOTE — ED Notes (Signed)
Pt c/o mid chest pain radiating to left arm.  Some nausea without vomiting.  Denies shortness of breath or diaphoresis.

## 2012-03-14 ENCOUNTER — Emergency Department (HOSPITAL_COMMUNITY)
Admission: EM | Admit: 2012-03-14 | Discharge: 2012-03-14 | Disposition: A | Discharge status: Home / Self Care | Payer: Medicare Other | Attending: Emergency Medicine | Admitting: Emergency Medicine

## 2012-03-14 ENCOUNTER — Emergency Department (HOSPITAL_COMMUNITY): Payer: Medicare Other

## 2012-03-14 DIAGNOSIS — R0789 Other chest pain: Secondary | ICD-10-CM

## 2012-03-14 LAB — CBC WITH DIFFERENTIAL/PLATELET
Basophils Relative: 0 % (ref 0–1)
Eosinophils Absolute: 0.3 10*3/uL (ref 0.0–0.7)
Eosinophils Relative: 5 % (ref 0–5)
HCT: 37.8 % (ref 36.0–46.0)
Hemoglobin: 13.3 g/dL (ref 12.0–15.0)
MCH: 32.7 pg (ref 26.0–34.0)
MCHC: 35.2 g/dL (ref 30.0–36.0)
MCV: 92.9 fL (ref 78.0–100.0)
Monocytes Absolute: 0.4 10*3/uL (ref 0.1–1.0)
Monocytes Relative: 7 % (ref 3–12)

## 2012-03-14 LAB — D-DIMER, QUANTITATIVE: D-Dimer, Quant: 0.33 ug/mL-FEU (ref 0.00–0.48)

## 2012-03-14 LAB — POCT I-STAT TROPONIN I: Troponin i, poc: 0 ng/mL (ref 0.00–0.08)

## 2012-03-14 MED ORDER — KETOROLAC TROMETHAMINE 30 MG/ML IJ SOLN
30.0000 mg | Freq: Once | INTRAMUSCULAR | Status: AC
  Administered 2012-03-14: 30 mg via INTRAVENOUS
  Filled 2012-03-14: qty 1

## 2012-03-14 MED ORDER — IBUPROFEN 600 MG PO TABS: 600.0000 mg | ORAL_TABLET | Freq: Four times a day (QID) | ORAL | Status: DC | PRN

## 2012-03-14 NOTE — ED Provider Notes (Signed)
History     CSN: [id number]  Arrival date & time 03/13/12  2249   First MD Initiated Contact with Patient 03/14/12 [phone]      Chief Complaint  Patient presents with  . Chest Pain    (Consider location/radiation/quality/duration/timing/severity/associated sxs/prior treatment) Patient is a 62 y.o. female presenting with chest pain. The history is provided by the patient.  Chest Pain The chest pain began 6 - 12 hours ago. Chest pain occurs constantly. The chest pain is unchanged. The pain is associated with lifting. The severity of the pain is moderate. The quality of the pain is described as sharp. The pain does not radiate. Pertinent negatives for primary symptoms include no fever, no shortness of breath and no palpitations.  Pertinent negatives for associated symptoms include no diaphoresis. She tried nothing for the symptoms. Risk factors include being elderly.  Pertinent negatives for past medical history include no MI.  Pertinent negatives for family medical history include: no CAD in family.     Past Medical History  Diagnosis Date  . Hypertension     Past Surgical History  Procedure Date  . Abdominal hysterectomy   . Cholecystectomy   . Kidney stones   . Back surgery     Family History  Problem Relation Age of Onset  . Alzheimer's disease Mother   . Heart failure Mother   . Stroke Father   . Coronary artery disease Sister   . Coronary artery disease Sister   . Coronary artery disease Sister     History  Substance Use Topics  . Smoking status: Current Some Day Smoker    Types: Cigarettes  . Smokeless tobacco: Never Used  . Alcohol Use: No    OB History    Grav Para Term Preterm Abortions TAB SAB Ect Mult Living                  Review of Systems  Constitutional: Negative for fever and diaphoresis.  HENT: Negative for neck pain.   Respiratory: Negative for chest tightness and shortness of breath.   Cardiovascular: Positive for chest pain. Negative  for palpitations and leg swelling.  All other systems reviewed and are negative.    Allergies  Compazine; Influenza vaccines; Metoclopramide hcl; and Zofran  Home Medications   Current Outpatient Rx  Name  Route  Sig  Dispense  Refill  . ACETAMINOPHEN 500 MG PO TABS   Oral   Take 500 mg by mouth every 6 (six) hours as needed. For arthritis pain         . ASPIRIN EC 81 MG PO TBEC   Oral   Take 81 mg by mouth daily.         Marland Kitchen DIAZEPAM 5 MG PO TABS   Oral   Take 5 mg by mouth at bedtime as needed. For sleep         . DICLOFENAC SODIUM 1 % TD GEL   Topical   Apply 1 application topically 4 (four) times daily. For back         . GABAPENTIN 300 MG PO CAPS   Oral   Take 1 capsule (300 mg total) by mouth 2 (two) times daily.   60 capsule   0   . HYDROCHLOROTHIAZIDE 12.5 MG PO CAPS   Oral   Take 1 capsule (12.5 mg total) by mouth daily.   30 capsule   0   . OXYCODONE-ACETAMINOPHEN 10-325 MG PO TABS   Oral   Take 0.5  tablets by mouth every 6 (six) hours as needed. For pain           BP 149/81  Pulse 74  Temp 98.4 F (36.9 C) (Oral)  Resp 22  SpO2 99%  Physical Exam  Constitutional: She is oriented to person, place, and time. She appears well-developed and well-nourished. No distress.  HENT:  Head: Normocephalic and atraumatic.  Mouth/Throat: Oropharynx is clear and moist.  Eyes: Conjunctivae normal are normal. Pupils are equal, round, and reactive to light.  Neck: Normal range of motion.  Cardiovascular: Normal rate, regular rhythm and intact distal pulses.   Pulmonary/Chest: Effort normal and breath sounds normal. No respiratory distress. She has no wheezes. She has no rales. She exhibits tenderness.  Abdominal: Soft. Bowel sounds are normal. There is no tenderness. There is no rebound and no guarding.  Musculoskeletal: Normal range of motion. She exhibits no edema and no tenderness.  Neurological: She is alert and oriented to person, place, and time.  She has normal reflexes.  Skin: Skin is warm and dry.  Psychiatric: She has a normal mood and affect.    ED Course  Procedures (including critical care time)  Labs Reviewed  COMPREHENSIVE METABOLIC PANEL - Abnormal; Notable for the following:    Potassium 3.2 (*)     Glucose, Bld 129 (*)     AST 38 (*)     GFR calc non Af Amer 89 (*)     All other components within normal limits  CBC WITH DIFFERENTIAL  D-DIMER, QUANTITATIVE  POCT I-STAT TROPONIN I   No results found.   No diagnosis found.    MDM   Date: 03/14/2012  Rate: 95  Rhythm: normal sinus rhythm  QRS Axis: normal  Intervals: normal  ST/T Wave abnormalities: normal  Conduction Disutrbances:none  Narrative Interpretation:   Old EKG Reviewed: unchanged  > 8 hours of symptoms with negative EKG and troponin is sufficient to exclude ACS.  Pain is MSK in nature.  Follow up with your regular doctor for ongoing care        Arturo Freundlich K Reynald Woods-Rasch, MD 03/14/12 1610

## 2012-03-14 NOTE — ED Notes (Signed)
Discharge inst given to patient. Voiced understanding.

## 2012-03-29 ENCOUNTER — Emergency Department (HOSPITAL_COMMUNITY): Payer: Medicare Other

## 2012-03-29 ENCOUNTER — Emergency Department (HOSPITAL_COMMUNITY)
Admission: EM | Admit: 2012-03-29 | Discharge: 2012-03-29 | Disposition: A | Discharge status: Home / Self Care | Payer: Medicare Other | Attending: Emergency Medicine | Admitting: Emergency Medicine

## 2012-03-29 ENCOUNTER — Encounter (HOSPITAL_COMMUNITY): Payer: Self-pay | Admitting: *Deleted

## 2012-03-29 DIAGNOSIS — R6889 Other general symptoms and signs: Secondary | ICD-10-CM | POA: Insufficient documentation

## 2012-03-29 DIAGNOSIS — Z8679 Personal history of other diseases of the circulatory system: Secondary | ICD-10-CM | POA: Insufficient documentation

## 2012-03-29 DIAGNOSIS — R110 Nausea: Secondary | ICD-10-CM | POA: Insufficient documentation

## 2012-03-29 DIAGNOSIS — Z87448 Personal history of other diseases of urinary system: Secondary | ICD-10-CM | POA: Insufficient documentation

## 2012-03-29 DIAGNOSIS — G479 Sleep disorder, unspecified: Secondary | ICD-10-CM | POA: Insufficient documentation

## 2012-03-29 DIAGNOSIS — G4700 Insomnia, unspecified: Secondary | ICD-10-CM | POA: Insufficient documentation

## 2012-03-29 DIAGNOSIS — Z79899 Other long term (current) drug therapy: Secondary | ICD-10-CM | POA: Insufficient documentation

## 2012-03-29 DIAGNOSIS — I10 Essential (primary) hypertension: Secondary | ICD-10-CM | POA: Insufficient documentation

## 2012-03-29 DIAGNOSIS — F17200 Nicotine dependence, unspecified, uncomplicated: Secondary | ICD-10-CM | POA: Insufficient documentation

## 2012-03-29 DIAGNOSIS — F329 Major depressive disorder, single episode, unspecified: Secondary | ICD-10-CM

## 2012-03-29 DIAGNOSIS — Z5900 Homelessness unspecified: Secondary | ICD-10-CM | POA: Insufficient documentation

## 2012-03-29 DIAGNOSIS — R079 Chest pain, unspecified: Secondary | ICD-10-CM | POA: Insufficient documentation

## 2012-03-29 DIAGNOSIS — F39 Unspecified mood [affective] disorder: Secondary | ICD-10-CM | POA: Insufficient documentation

## 2012-03-29 DIAGNOSIS — F3289 Other specified depressive episodes: Secondary | ICD-10-CM | POA: Insufficient documentation

## 2012-03-29 DIAGNOSIS — F411 Generalized anxiety disorder: Secondary | ICD-10-CM | POA: Insufficient documentation

## 2012-03-29 HISTORY — DX: Depression, unspecified: F32.A

## 2012-03-29 HISTORY — DX: Major depressive disorder, single episode, unspecified: F32.9

## 2012-03-29 HISTORY — DX: Disorder of kidney and ureter, unspecified: N28.9

## 2012-03-29 LAB — RAPID URINE DRUG SCREEN, HOSP PERFORMED: Opiates: NOT DETECTED

## 2012-03-29 LAB — URINALYSIS, ROUTINE W REFLEX MICROSCOPIC
Hgb urine dipstick: NEGATIVE
Leukocytes, UA: NEGATIVE
Nitrite: NEGATIVE
Protein, ur: NEGATIVE mg/dL
Specific Gravity, Urine: 1.028 (ref 1.005–1.030)
Urobilinogen, UA: 0.2 mg/dL (ref 0.0–1.0)

## 2012-03-29 LAB — CBC WITH DIFFERENTIAL/PLATELET
Basophils Absolute: 0 10*3/uL (ref 0.0–0.1)
Eosinophils Absolute: 0.2 10*3/uL (ref 0.0–0.7)
Lymphs Abs: 1.2 10*3/uL (ref 0.7–4.0)
MCH: 31.6 pg (ref 26.0–34.0)
Neutrophils Relative %: 50 % (ref 43–77)
Platelets: 214 10*3/uL (ref 150–400)
RBC: 4.15 MIL/uL (ref 3.87–5.11)
RDW: 12.3 % (ref 11.5–15.5)
WBC: 3.9 10*3/uL — ABNORMAL LOW (ref 4.0–10.5)

## 2012-03-29 LAB — BASIC METABOLIC PANEL
GFR calc Af Amer: 83 mL/min — ABNORMAL LOW (ref >90)
GFR calc non Af Amer: 71 mL/min — ABNORMAL LOW (ref >90)
Glucose, Bld: 99 mg/dL (ref 70–99)
Potassium: 4.8 mEq/L (ref 3.5–5.1)
Sodium: 138 mEq/L (ref 135–145)

## 2012-03-29 LAB — ETHANOL: Alcohol, Ethyl (B): <11 mg/dL (ref 0–11)

## 2012-03-29 LAB — POCT I-STAT TROPONIN I: Troponin i, poc: 0 ng/mL (ref 0.00–0.08)

## 2012-03-29 MED ORDER — ALUM & MAG HYDROXIDE-SIMETH 200-200-20 MG/5ML PO SUSP: 30.0000 mL | ORAL | Status: DC | PRN

## 2012-03-29 MED ORDER — IBUPROFEN 600 MG PO TABS: 600.0000 mg | ORAL_TABLET | Freq: Three times a day (TID) | ORAL | Status: DC | PRN

## 2012-03-29 MED ORDER — ACETAMINOPHEN 325 MG PO TABS: 650.0000 mg | ORAL_TABLET | ORAL | Status: DC | PRN

## 2012-03-29 MED ORDER — LORAZEPAM 1 MG PO TABS
1.0000 mg | ORAL_TABLET | Freq: Once | ORAL | Status: AC
  Administered 2012-03-29: 1 mg via ORAL
  Filled 2012-03-29: qty 1

## 2012-03-29 MED ORDER — ZOLPIDEM TARTRATE 5 MG PO TABS
5.0000 mg | ORAL_TABLET | Freq: Every evening | ORAL | Status: DC | PRN
  Administered 2012-03-29: 5 mg via ORAL
  Filled 2012-03-29: qty 1

## 2012-03-29 MED ORDER — ZOLPIDEM TARTRATE 10 MG PO TABS: 10.0000 mg | ORAL_TABLET | Freq: Every evening | ORAL | Status: DC | PRN

## 2012-03-29 MED ORDER — DIAZEPAM 5 MG PO TABS: 5.0000 mg | ORAL_TABLET | Freq: Two times a day (BID) | ORAL | Status: DC

## 2012-03-29 MED ORDER — PAROXETINE HCL 20 MG PO TABS: 20.0000 mg | ORAL_TABLET | Freq: Every evening | ORAL | Status: DC

## 2012-03-29 MED ORDER — NICOTINE 21 MG/24HR TD PT24
21.0000 mg | MEDICATED_PATCH | Freq: Every day | TRANSDERMAL | Status: DC
  Administered 2012-03-29: 21 mg via TRANSDERMAL
  Filled 2012-03-29: qty 1

## 2012-03-29 MED ORDER — LORAZEPAM 1 MG PO TABS: 1.0000 mg | ORAL_TABLET | Freq: Three times a day (TID) | ORAL | Status: DC | PRN

## 2012-03-29 NOTE — ED Notes (Signed)
Per family member pt was seen at [facility] hospital earlier this month for same symptoms. Family member sts pt is homeless and has no help from family.Pt's niece sts pt is suicidal, however, pt denies SI. Pt sts she cries all the time because she has nowhere to go and is staying with a friend right now. Pt is sad and crying. Pt sts she needs help getting into assisted living facility.

## 2012-03-29 NOTE — ED Notes (Signed)
Pt's niece would like to speak with social work regarding pt's care. Her name is Jake Samples, phone # [phone]

## 2012-03-29 NOTE — ED Notes (Signed)
Unable to report off to psych ed at this time, due to RN's doing rounds and sitting in for telepsych consult. Janie RN will call back as soon as she can.

## 2012-03-29 NOTE — ED Notes (Signed)
Pt belongings (one bag) in locker number number [phone].

## 2012-03-29 NOTE — ED Notes (Signed)
Pt c/o chest pain that started around 5:00 am today. Reports mild nausea. Current everyday smoker. Pt also reports family hx of cardiac disease.

## 2012-03-29 NOTE — ED Provider Notes (Signed)
History     CSN: [id number]  Arrival date & time 03/29/12  1101   First MD Initiated Contact with Patient 03/29/12 1116      Chief Complaint  Patient presents with  . Chest Pain    (Consider location/radiation/quality/duration/timing/severity/associated sxs/prior treatment) Patient is a 62 y.o. female presenting with mental health disorder. The history is provided by the patient and a relative.  Mental Health Problem The primary symptoms include dysphoric mood. The current episode started more than 2 weeks ago. This is a recurrent problem.  The onset of the illness is precipitated by emotional stress and a stressful event (pt has lived with her mother for years and recently her sister came and took her mother and moved and she is here without a home or her mother which has made her very stressed). The degree of incapacity that she is experiencing as a consequence of her illness is severe. Sequelae of the illness include an inability to care for self. Additional symptoms of the illness include anhedonia, insomnia, appetite change and feelings of worthlessness. Associated symptoms comments: When stress gets really bad she gets chest pain. She does not admit to suicidal ideas. She does not have a plan to commit suicide. Risk factors that are present for mental illness include a history of mental illness (prior hx of depression.).    Past Medical History  Diagnosis Date  . Hypertension   . Renal disorder kidney stones    Past Surgical History  Procedure Date  . Abdominal hysterectomy   . Cholecystectomy   . Kidney stones   . Back surgery     Family History  Problem Relation Age of Onset  . Alzheimer's disease Mother   . Heart failure Mother   . Stroke Father   . Coronary artery disease Sister   . Coronary artery disease Sister   . Coronary artery disease Sister     History  Substance Use Topics  . Smoking status: Current Some Day Smoker    Types: Cigarettes  . Smokeless  tobacco: Never Used  . Alcohol Use: No    OB History    Grav Para Term Preterm Abortions TAB SAB Ect Mult Living                  Review of Systems  Constitutional: Positive for appetite change.  Respiratory: Positive for chest tightness. Negative for cough, shortness of breath and wheezing.   Gastrointestinal:       Mild reflux symptoms prior to pain starting at time  Psychiatric/Behavioral: Positive for sleep disturbance and dysphoric mood. The patient is nervous/anxious and has insomnia.   All other systems reviewed and are negative.    Allergies  Compazine; Influenza vaccines; Metoclopramide hcl; and Zofran  Home Medications   Current Outpatient Rx  Name  Route  Sig  Dispense  Refill  . ACETAMINOPHEN 500 MG PO TABS   Oral   Take 500 mg by mouth every 6 (six) hours as needed. For arthritis pain         . ASPIRIN EC 81 MG PO TBEC   Oral   Take 81 mg by mouth daily.         Marland Kitchen DIAZEPAM 5 MG PO TABS   Oral   Take 5 mg by mouth at bedtime as needed. For sleep         . DICLOFENAC SODIUM 1 % TD GEL   Topical   Apply 1 application topically 4 (four) times daily. For  back         . GABAPENTIN 300 MG PO CAPS   Oral   Take 1 capsule (300 mg total) by mouth 2 (two) times daily.   60 capsule   0   . HYDROCHLOROTHIAZIDE 12.5 MG PO CAPS   Oral   Take 1 capsule (12.5 mg total) by mouth daily.   30 capsule   0   . IBUPROFEN 600 MG PO TABS   Oral   Take 1 tablet (600 mg total) by mouth every 6 (six) hours as needed for pain.   30 tablet   0   . OXYCODONE-ACETAMINOPHEN 10-325 MG PO TABS   Oral   Take 0.5 tablets by mouth every 6 (six) hours as needed. For pain           BP 138/94  Pulse 86  Temp 98 F (36.7 C) (Oral)  Resp 18  SpO2 100%  Physical Exam  Nursing note and vitals reviewed. Constitutional: She is oriented to person, place, and time. She appears well-developed and well-nourished. She appears distressed.  HENT:  Head: Normocephalic  and atraumatic.  Mouth/Throat: Oropharynx is clear and moist.  Eyes: Conjunctivae normal and EOM are normal. Pupils are equal, round, and reactive to light.  Neck: Normal range of motion. Neck supple.  Cardiovascular: Normal rate, regular rhythm and intact distal pulses.   No murmur heard. Pulmonary/Chest: Effort normal and breath sounds normal. No respiratory distress. She has no wheezes. She has no rales.  Abdominal: Soft. She exhibits no distension. There is no tenderness. There is no rebound and no guarding.  Musculoskeletal: Normal range of motion. She exhibits no edema and no tenderness.  Neurological: She is alert and oriented to person, place, and time.  Skin: Skin is warm and dry. No rash noted. No erythema.  Psychiatric: Her behavior is normal. Her mood appears anxious. Thought content is not paranoid. She exhibits a depressed mood. She expresses no homicidal and no suicidal ideation.       Tearful on exam    ED Course  Procedures (including critical care time)  Labs Reviewed  CBC WITH DIFFERENTIAL - Abnormal; Notable for the following:    WBC 3.9 (*)     Monocytes Relative 14 (*)     All other components within normal limits  URINE RAPID DRUG SCREEN (HOSP PERFORMED) - Abnormal; Notable for the following:    Benzodiazepines POSITIVE (*)     All other components within normal limits  URINALYSIS, ROUTINE W REFLEX MICROSCOPIC  POCT I-STAT TROPONIN I  BASIC METABOLIC PANEL  ETHANOL   Dg Chest 2 View  03/29/2012  *RADIOLOGY REPORT*  Clinical Data: Chest pain  CHEST - 2 VIEW  Comparison: 1/11 /14  Findings: Cardiomediastinal silhouette is stable.  Again noted post surgical changes upper thoracic spine.  No acute infiltrate or pleural effusion.  No pulmonary edema.  Mild hyperinflation again noted.  IMPRESSION: No active disease.  No significant change.   Original Report Authenticated By: Natasha Mead, M.D.      Date: 03/29/2012  Rate: 87  Rhythm: normal sinus rhythm  QRS Axis:  normal  Intervals: normal  ST/T Wave abnormalities: normal  Conduction Disutrbances: none  Narrative Interpretation: unremarkable     No diagnosis found.    MDM   Patient with history of depression who recently has had a lot of increased stressors in her life and changing of her home situation which has made her more depressed, anxious which causes her to have  chest pain. She's been here multiple times for chest pain and her evaluations were all negative. She states today she is having chest pain but her biggest issue she needs help her depression. She does denies any suicidal or homicidal ideation. She denies any cough or shortness of breath. She is very anxious on exam mildly tachycardic.Marland Kitchen Her VS normal limits. Will do medical screening labs.   CXR and troponin added.  Since pt's pain has been ongoing since 5 should be abnormal if truly cardiac.  12:58 PM Labs unrevealing and will speak with ACT.      Gwyneth Sprout, MD 03/29/12 1258

## 2012-03-29 NOTE — BH Assessment (Signed)
Assessment Note   [name] is an 62 y.o. female that presented to [facility] due to her feeling stressed and depressed. Pt denies SI, HI, AV hallucinations, psychosis, sexual, physical or emotional abuse and sa. Pt is oriented x's4, alert, calm and cooperative. Pt reports her stressor is not being able to see her mother after her sister took their mom to live with her. Pt reports that after there dad passed in 2006 she was moms' caretaker, until recently. Pt reports depressive symptoms tearful, isolating, guilt, loss of interest in pleasurable things and pt becomes very tearful when talking about not being able to see or talk to her mother. Pt reports that she finds difficulty getting to sleep and only sleeps 3-4 hrs in 24. Pt reports having good appetite and a sharper remote memory, her recent memory is impaired. Pt reports her concentration has decreased. Pt reports that she has had mh tx x's1 at a facility that she believes to be the 5th floor of [facility] "for about a week" and when asked why she was there she said "for depression but it ain't never been this bad". 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 Pt reports that "I didn't have the money for my prescriptions, and I ran out". Pt reports that she "just want to get better". Ranae Pila, LCAS   Axis I: Depressive Disorder NOS Axis II: Deferred Axis III:  Past Medical History  Diagnosis Date  . Hypertension   . Renal disorder kidney stones  . Depression    Axis IV: economic problems, housing problems, problems related to social environment, problems with access to health care services and problems with primary support group Axis V: 41-50 serious symptoms  Past Medical History:  Past Medical History  Diagnosis Date  . Hypertension   . Renal disorder kidney stones  . Depression     Past Surgical History  Procedure Date  .  Abdominal hysterectomy   . Cholecystectomy   . Kidney stones   . Back surgery     Family History:  Family History  Problem Relation Age of Onset  . Alzheimer's disease Mother   . Heart failure Mother   . Stroke Father   . Coronary artery disease Sister   . Coronary artery disease Sister   . Coronary artery disease Sister     Social History:  reports that she has been smoking Cigarettes.  She has never used smokeless tobacco. She reports that she does not drink alcohol or use illicit drugs.  Additional Social History:  Alcohol / Drug Use Pain Medications: pt denies Prescriptions: pt denies Over the Counter: pt denies History of alcohol / drug use?: No history of alcohol / drug abuse  CIWA: CIWA-Ar BP: 126/78 mmHg Pulse Rate: 81  Nausea and Vomiting: no nausea and no vomiting Tactile Disturbances: none Tremor: no tremor Auditory Disturbances: not present Paroxysmal Sweats: no sweat visible Visual Disturbances: not present Anxiety: no anxiety, at ease Headache, Fullness in Head: none present Agitation: normal activity Orientation and Clouding of Sensorium: oriented and can do serial additions CIWA-Ar Total: 0  COWS: Clinical Opiate Withdrawal Scale (COWS) Sweating: No report of chills or flushing Restlessness: Able to sit still Pupil Size: Pupils pinned or normal size for room light Bone or Joint Aches: Not present Runny Nose or Tearing: Not present GI Upset: No GI symptoms Tremor: No tremor Yawning:  No yawning Anxiety or Irritability: None Gooseflesh Skin: Skin is smooth  Allergies:  Allergies  Allergen Reactions  . Compazine Rash    Gets spots on skin and has to get it flushed out of her system  . Influenza Vaccines Other (See Comments)    Got pneumonia, will not take vaccine  . Metoclopramide Hcl Hives  . Zofran Hives    Home Medications:  (Not in a hospital admission)  OB/GYN Status:  No LMP recorded. Patient has had a hysterectomy.  General  Assessment Data Location of Assessment: [facility] ACT Assessment: Yes Living Arrangements: Non-relatives/Friends Can pt return to current living arrangement?: Yes Admission Status: Voluntary Is patient capable of signing voluntary admission?: Yes Transfer from: Home Referral Source: Self/Family/Friend     Risk to self Suicidal Ideation: No Suicidal Intent: No Is patient at risk for suicide?: No Suicidal Plan?: No Access to Means: No What has been your use of drugs/alcohol within the last 12 months?:  (none) Previous Attempts/Gestures: No How many times?:  (0) Other Self Harm Risks:  (none noted) Intentional Self Injurious Behavior: None Family Suicide History: Yes (sisters and brother) Recent stressful life event(s): Conflict (Comment) (with sister) Persecutory voices/beliefs?: No Depression: Yes Depression Symptoms: Insomnia;Tearfulness;Isolating;Fatigue;Guilt;Loss of interest in usual pleasures;Feeling worthless/self pity;Feeling angry/irritable Substance abuse history and/or treatment for substance abuse?: No Suicide prevention information given to non-admitted patients: Not applicable  Risk to Others Homicidal Ideation: No Thoughts of Harm to Others: No Current Homicidal Intent: No Current Homicidal Plan: No Access to Homicidal Means: No Identified Victim:  (none) History of harm to others?: No Assessment of Violence: None Noted Violent Behavior Description:  (calm and cooperative) Does patient have access to weapons?: No Criminal Charges Pending?: No Does patient have a court date: No  Psychosis Hallucinations: None noted Delusions: None noted  Mental Status Report Appear/Hygiene:  (hospital wear) Eye Contact: Good Motor Activity: Freedom of movement Speech: Logical/coherent Level of Consciousness: Alert Mood: Depressed Affect: Appropriate to circumstance Anxiety Level: Minimal Thought Processes: Coherent;Relevant Judgement: Unimpaired Orientation:  Person;Place;Time;Situation;Appropriate for developmental age Obsessive Compulsive Thoughts/Behaviors: Minimal  Cognitive Functioning Concentration: Decreased Memory: Recent Impaired;Remote Intact IQ: Average Insight: Fair Impulse Control: Fair Appetite: Good Weight Loss:  (0) Weight Gain:  (0) Sleep: Decreased Total Hours of Sleep:  (pt reports 3-4 hrs out of 24) Vegetative Symptoms: None  ADLScreening [facility] Assessment Services) Patient's cognitive ability adequate to safely complete daily activities?: Yes Patient able to express need for assistance with ADLs?: Yes Independently performs ADLs?: Yes (appropriate for developmental age)  Abuse/Neglect [facility]) Physical Abuse: Denies Verbal Abuse: Denies Sexual Abuse: Denies  Prior Inpatient Therapy Prior Inpatient Therapy: Yes Prior Therapy Dates:  (2007/2008) Prior Therapy Facilty/Provider(s):  (pt unsure) Reason for Treatment:  (pt reports depression)  Prior Outpatient Therapy Prior Outpatient Therapy: No  ADL Screening (condition at time of admission) Patient's cognitive ability adequate to safely complete daily activities?: Yes Patient able to express need for assistance with ADLs?: Yes Independently performs ADLs?: Yes (appropriate for developmental age) Weakness of Legs: None Weakness of Arms/Hands: None  Home Assistive Devices/Equipment Home Assistive Devices/Equipment: None  Therapy Consults (therapy consults require a physician order) PT Evaluation Needed: No OT Evalulation Needed: No SLP Evaluation Needed: No Abuse/Neglect Assessment (Assessment to be complete while patient is alone) Physical Abuse: Denies Verbal Abuse: Denies Sexual Abuse: Denies Exploitation of patient/patient's resources: Denies Self-Neglect: Denies Values / Beliefs Cultural Requests During Hospitalization: None Spiritual Requests During Hospitalization: None Consults Spiritual Care Consult Needed: No Social Work  Consult Needed:  No Advance Directives (For Healthcare) Advance Directive: Patient does not have advance directive Pre-existing out of facility DNR order (yellow form or pink MOST form): No Nutrition Screen- [facility] Patient's home diet: Regular Have you recently lost weight without trying?: No Have you been eating poorly because of a decreased appetite?: No Malnutrition Screening Tool Score: 0   Additional Information 1:1 In Past 12 Months?: No CIRT Risk: No Elopement Risk: No Does patient have medical clearance?: Yes     Disposition: Pt is waiting to have tele-psych, ordered by EDP [place]. Disposition Disposition of Patient: Outpatient treatment Type of outpatient treatment: Adult  On Site Evaluation by:   Reviewed with Physician:     Manual Meier 03/29/2012 5:56 PM

## 2012-06-24 ENCOUNTER — Emergency Department (HOSPITAL_COMMUNITY): Payer: Medicare Other

## 2012-06-24 ENCOUNTER — Encounter (HOSPITAL_COMMUNITY): Payer: Self-pay | Admitting: Emergency Medicine

## 2012-06-24 ENCOUNTER — Emergency Department (HOSPITAL_COMMUNITY)
Admission: EM | Admit: 2012-06-24 | Discharge: 2012-06-24 | Disposition: A | Discharge status: Home / Self Care | Payer: Medicare Other | Attending: Emergency Medicine | Admitting: Emergency Medicine

## 2012-06-24 DIAGNOSIS — Z8739 Personal history of other diseases of the musculoskeletal system and connective tissue: Secondary | ICD-10-CM | POA: Insufficient documentation

## 2012-06-24 DIAGNOSIS — Z87442 Personal history of urinary calculi: Secondary | ICD-10-CM | POA: Insufficient documentation

## 2012-06-24 DIAGNOSIS — R0789 Other chest pain: Secondary | ICD-10-CM

## 2012-06-24 DIAGNOSIS — X58XXXA Exposure to other specified factors, initial encounter: Secondary | ICD-10-CM | POA: Insufficient documentation

## 2012-06-24 DIAGNOSIS — S298XXA Other specified injuries of thorax, initial encounter: Secondary | ICD-10-CM | POA: Insufficient documentation

## 2012-06-24 DIAGNOSIS — S239XXA Sprain of unspecified parts of thorax, initial encounter: Secondary | ICD-10-CM | POA: Insufficient documentation

## 2012-06-24 DIAGNOSIS — F17200 Nicotine dependence, unspecified, uncomplicated: Secondary | ICD-10-CM | POA: Insufficient documentation

## 2012-06-24 DIAGNOSIS — Z8659 Personal history of other mental and behavioral disorders: Secondary | ICD-10-CM | POA: Insufficient documentation

## 2012-06-24 DIAGNOSIS — Y939 Activity, unspecified: Secondary | ICD-10-CM | POA: Insufficient documentation

## 2012-06-24 DIAGNOSIS — Z9889 Other specified postprocedural states: Secondary | ICD-10-CM | POA: Insufficient documentation

## 2012-06-24 DIAGNOSIS — Y929 Unspecified place or not applicable: Secondary | ICD-10-CM | POA: Insufficient documentation

## 2012-06-24 DIAGNOSIS — I10 Essential (primary) hypertension: Secondary | ICD-10-CM | POA: Insufficient documentation

## 2012-06-24 DIAGNOSIS — S29019A Strain of muscle and tendon of unspecified wall of thorax, initial encounter: Secondary | ICD-10-CM

## 2012-06-24 LAB — BASIC METABOLIC PANEL
GFR calc Af Amer: 89 mL/min — ABNORMAL LOW (ref >90)
GFR calc non Af Amer: 77 mL/min — ABNORMAL LOW (ref >90)
Potassium: 3.7 mEq/L (ref 3.5–5.1)
Sodium: 141 mEq/L (ref 135–145)

## 2012-06-24 LAB — CBC WITH DIFFERENTIAL/PLATELET
Basophils Relative: 0 % (ref 0–1)
Eosinophils Absolute: 0.2 10*3/uL (ref 0.0–0.7)
Lymphs Abs: 1.7 10*3/uL (ref 0.7–4.0)
MCH: 31.7 pg (ref 26.0–34.0)
MCHC: 35.4 g/dL (ref 30.0–36.0)
Neutrophils Relative %: 56 % (ref 43–77)
Platelets: 204 10*3/uL (ref 150–400)
RBC: 4.1 MIL/uL (ref 3.87–5.11)

## 2012-06-24 LAB — POCT I-STAT TROPONIN I

## 2012-06-24 MED ORDER — MORPHINE SULFATE 4 MG/ML IJ SOLN: 4.0000 mg | Freq: Once | INTRAMUSCULAR | Status: DC

## 2012-06-24 MED ORDER — PROMETHAZINE HCL 25 MG/ML IJ SOLN
12.5000 mg | Freq: Once | INTRAMUSCULAR | Status: DC
  Filled 2012-06-24: qty 1

## 2012-06-24 MED ORDER — IBUPROFEN 600 MG PO TABS: 600.0000 mg | ORAL_TABLET | Freq: Four times a day (QID) | ORAL | Status: DC | PRN

## 2012-06-24 MED ORDER — METHOCARBAMOL 500 MG PO TABS
500.0000 mg | ORAL_TABLET | Freq: Once | ORAL | Status: AC
  Administered 2012-06-24: 500 mg via ORAL
  Filled 2012-06-24: qty 1

## 2012-06-24 MED ORDER — TRAMADOL HCL 50 MG PO TABS: 50.0000 mg | ORAL_TABLET | Freq: Four times a day (QID) | ORAL | Status: DC | PRN

## 2012-06-24 MED ORDER — ASPIRIN 81 MG PO CHEW
324.0000 mg | CHEWABLE_TABLET | Freq: Once | ORAL | Status: AC
  Administered 2012-06-24: 324 mg via ORAL
  Filled 2012-06-24: qty 4

## 2012-06-24 MED ORDER — HYDROCODONE-ACETAMINOPHEN 5-325 MG PO TABS
1.0000 | ORAL_TABLET | Freq: Once | ORAL | Status: AC
  Administered 2012-06-24: 1 via ORAL
  Filled 2012-06-24: qty 1

## 2012-06-24 MED ORDER — KETOROLAC TROMETHAMINE 60 MG/2ML IM SOLN
60.0000 mg | Freq: Once | INTRAMUSCULAR | Status: AC
  Administered 2012-06-24: 60 mg via INTRAMUSCULAR
  Filled 2012-06-24: qty 2

## 2012-06-24 MED ORDER — METHOCARBAMOL 500 MG PO TABS: 500.0000 mg | ORAL_TABLET | Freq: Two times a day (BID) | ORAL | Status: DC

## 2012-06-24 NOTE — ED Notes (Signed)
Elevated blood pressure and heart rate reported to nurse-cindy.

## 2012-06-24 NOTE — ED Provider Notes (Deleted)
History     CSN: [id number]  Arrival date & time 06/24/12  1252   First MD Initiated Contact with Patient 06/24/12 1411      Chief Complaint  Patient presents with  . Shoulder Pain    (Consider location/radiation/quality/duration/timing/severity/associated sxs/prior treatment) HPI  Past Medical History  Diagnosis Date  . Hypertension   . Renal disorder kidney stones  . Depression     Past Surgical History  Procedure Laterality Date  . Abdominal hysterectomy    . Cholecystectomy    . Kidney stones    . Back surgery      Family History  Problem Relation Age of Onset  . Alzheimer's disease Mother   . Heart failure Mother   . Stroke Father   . Coronary artery disease Sister   . Coronary artery disease Sister   . Coronary artery disease Sister     History  Substance Use Topics  . Smoking status: Current Some Day Smoker    Types: Cigarettes  . Smokeless tobacco: Never Used  . Alcohol Use: No    OB History   Grav Para Term Preterm Abortions TAB SAB Ect Mult Living                  Review of Systems  Allergies  Compazine; Influenza vaccines; Metoclopramide hcl; and Zofran  Home Medications   Current Outpatient Rx  Name  Route  Sig  Dispense  Refill  . acetaminophen (TYLENOL) 500 MG tablet   Oral   Take 1,000 mg by mouth every 6 (six) hours as needed. For arthritis pain           BP 184/107  Pulse 108  Temp(Src) 98.5 F (36.9 C) (Oral)  Resp 20  SpO2 96%  Physical Exam  ED Course  Procedures (including critical care time)  Labs Reviewed  CBC WITH DIFFERENTIAL  BASIC METABOLIC PANEL   Dg Chest Portable 1 View  06/24/2012  *RADIOLOGY REPORT*  Clinical Data: Shortness of breath.  Chest pain.  PORTABLE CHEST - 1 VIEW  Comparison: 03/29/2012  Findings: Levoconvex upper thoracic scoliosis noted with posterolateral rod and laminar with fixation. The patient is rotated to the right on today's exam, resulting in reduced diagnostic sensitivity  and specificity.  Nipple shadow projects over the left mid to lower lung.  Mild interstitial accentuation observed, as can often be encountered in smokers.  Minimal biapical pleuroparenchymal scarring.  Accounting for technique and rotation, heart size is thought to be within normal limits.  IMPRESSION:  1.  Mild interstitial accentuation probably secondary to smoking. 2.  Upper thoracic scoliosis. 3.  No acute findings.   Original Report Authenticated By: Gaylyn Rong, M.D.      No diagnosis found.    MDM  This is a medical screening exam note        Burgess Amor, Cordelia Poche 06/25/12 1848

## 2012-06-24 NOTE — ED Provider Notes (Signed)
MSE was initiated and I personally evaluated the patient and placed orders (if any) at  2:35 PM on June 24, 2012.  The patient appears stable so that the remainder of the MSE may be completed by another provider.   Patient presents with sudden onset of severe mid upper back which is reproducible and right shoulder pain which started this morning at rest.  In the interim,  has also developed mild left-sided chest pain, shortness of breath and nausea.  She denies numbness or tingling in her upper extremities denies weakness.  She has taken no medications prior to arrival for the symptoms.  She does have a history of hypertension and back surgery with rod placement secondary to scoliosis.  She has no known cardiac history, but risk factors with age,  Hypertension and smoking status.  PE VSS,  Tachy and hypertensive.  Lungs - ctab Heart, no murmer,  Rate regular,  Tachy Abdomen soft Legs nontender, no edema Neck - ttp   Initial labs ordered,  Ekg,  Cxr.  Will move to acute side for work up.  Burgess Amor, PA-C 06/24/12 1438  Burgess Amor, PA-C 06/24/12 1440

## 2012-06-24 NOTE — ED Notes (Signed)
IV start unsuccessful attempt x 1.  

## 2012-06-24 NOTE — ED Provider Notes (Signed)
History     CSN: [id number]  Arrival date & time 06/24/12  1252   First MD Initiated Contact with Patient 06/24/12 1411      Chief Complaint  Patient presents with  . Shoulder Pain    (Consider location/radiation/quality/duration/timing/severity/associated sxs/prior treatment) HPI Pt present with L thoracic pain radiating to L lower chest upon waking this AM. No trauma or heavy lifting. Pain is worse with movement and palpation. No SOB, fever, chills, lower ext swelling or pain. Pt states she is just taking Tylenol for pain. +prev history of chest wall pain and scoliosis Past Medical History  Diagnosis Date  . Hypertension   . Renal disorder kidney stones  . Depression     Past Surgical History  Procedure Laterality Date  . Abdominal hysterectomy    . Cholecystectomy    . Kidney stones    . Back surgery      Family History  Problem Relation Age of Onset  . Alzheimer's disease Mother   . Heart failure Mother   . Stroke Father   . Coronary artery disease Sister   . Coronary artery disease Sister   . Coronary artery disease Sister     History  Substance Use Topics  . Smoking status: Current Some Day Smoker    Types: Cigarettes  . Smokeless tobacco: Never Used  . Alcohol Use: No    OB History   Grav Para Term Preterm Abortions TAB SAB Ect Mult Living                  Review of Systems  Constitutional: Negative for fever and chills.  Respiratory: Negative for chest tightness and shortness of breath.   Cardiovascular: Positive for chest pain.  Gastrointestinal: Negative for nausea, vomiting and abdominal pain.  Musculoskeletal: Positive for myalgias and back pain.  Skin: Negative for rash and wound.  Neurological: Negative for dizziness, weakness, numbness and headaches.  All other systems reviewed and are negative.    Allergies  Compazine; Influenza vaccines; Metoclopramide hcl; and Zofran  Home Medications   Current Outpatient Rx  Name  Route  Sig   Dispense  Refill  . acetaminophen (TYLENOL) 500 MG tablet   Oral   Take 1,000 mg by mouth every 6 (six) hours as needed. For arthritis pain         . ibuprofen (ADVIL,MOTRIN) 600 MG tablet   Oral   Take 1 tablet (600 mg total) by mouth every 6 (six) hours as needed for pain.   30 tablet   0   . methocarbamol (ROBAXIN) 500 MG tablet   Oral   Take 1 tablet (500 mg total) by mouth 2 (two) times daily.   20 tablet   0   . traMADol (ULTRAM) 50 MG tablet   Oral   Take 1 tablet (50 mg total) by mouth every 6 (six) hours as needed for pain.   15 tablet   0     BP 181/105  Pulse 92  Temp(Src) 98.5 F (36.9 C) (Oral)  Resp 18  SpO2 96%  Physical Exam  Nursing note and vitals reviewed. Constitutional: She is oriented to person, place, and time. She appears well-developed and well-nourished. No distress.  HENT:  Head: Normocephalic and atraumatic.  Mouth/Throat: Oropharynx is clear and moist.  Eyes: EOM are normal. Pupils are equal, round, and reactive to light.  Neck: Normal range of motion. Neck supple.  Cardiovascular: Normal rate and regular rhythm.   Pulmonary/Chest: Effort normal and breath  sounds normal. No respiratory distress. She has no wheezes. She has no rales. She exhibits tenderness (Chest tenderness reproduced completely with palpation of l parasternal costal cartilage. ).  Abdominal: Soft. Bowel sounds are normal. She exhibits no mass. There is no tenderness. There is no rebound and no guarding.  Musculoskeletal: Normal range of motion. She exhibits tenderness (TTP over L thoracic paraspinal muscles. no deformity. No midline TTP). She exhibits no edema.  Neurological: She is alert and oriented to person, place, and time.  5/5 motor in all ext, sensation intact  Skin: Skin is warm and dry. No rash noted. No erythema.  Psychiatric: She has a normal mood and affect. Her behavior is normal.    ED Course  Procedures (including critical care time)  Labs Reviewed   BASIC METABOLIC PANEL - Abnormal; Notable for the following:    GFR calc non Af Amer 77 (*)    GFR calc Af Amer 89 (*)    All other components within normal limits  CBC WITH DIFFERENTIAL  POCT I-STAT TROPONIN I   Dg Chest Portable 1 View  06/24/2012  *RADIOLOGY REPORT*  Clinical Data: Shortness of breath.  Chest pain.  PORTABLE CHEST - 1 VIEW  Comparison: 03/29/2012  Findings: Levoconvex upper thoracic scoliosis noted with posterolateral rod and laminar with fixation. The patient is rotated to the right on today's exam, resulting in reduced diagnostic sensitivity and specificity.  Nipple shadow projects over the left mid to lower lung.  Mild interstitial accentuation observed, as can often be encountered in smokers.  Minimal biapical pleuroparenchymal scarring.  Accounting for technique and rotation, heart size is thought to be within normal limits.  IMPRESSION:  1.  Mild interstitial accentuation probably secondary to smoking. 2.  Upper thoracic scoliosis. 3.  No acute findings.   Original Report Authenticated By: Gaylyn Rong, M.D.      1. Thoracic myofascial strain, initial encounter   2. Chest wall pain      Date: 06/24/2012  Rate: 85  Rhythm: normal sinus rhythm  QRS Axis: normal  Intervals: normal  ST/T Wave abnormalities: normal  Conduction Disutrbances:none  Narrative Interpretation:   Old EKG Reviewed: unchanged    MDM  Pain is MSK in origin. Will treat symptomatically and D/c home.         Loren Racer, MD 06/24/12 [phone]

## 2012-06-24 NOTE — ED Notes (Signed)
Rt neck and shoulder pain that started at 10 am no injury no numbness or tingling

## 2012-06-24 NOTE — ED Notes (Signed)
Changing the acuity of the patient at this time.   Patient told PA that she is having CP and SOB symptoms.

## 2012-06-24 NOTE — ED Notes (Addendum)
IV team paged. Pt stuck x 2 by 2 RNs without success.

## 2012-06-25 NOTE — ED Provider Notes (Signed)
Medical screening examination/treatment/procedure(s) were performed by non-physician practitioner and as supervising physician I was immediately available for consultation/collaboration.   Mackenize Delgadillo L Anaiya Wisinski, MD 06/25/12 1149 

## 2012-06-28 ENCOUNTER — Encounter (HOSPITAL_COMMUNITY): Payer: Self-pay | Admitting: Emergency Medicine

## 2012-06-28 ENCOUNTER — Emergency Department (HOSPITAL_COMMUNITY): Payer: Medicare Other

## 2012-06-28 ENCOUNTER — Emergency Department (HOSPITAL_COMMUNITY)
Admission: EM | Admit: 2012-06-28 | Discharge: 2012-06-28 | Disposition: A | Discharge status: Home / Self Care | Payer: Medicare Other | Attending: Emergency Medicine | Admitting: Emergency Medicine

## 2012-06-28 DIAGNOSIS — Z9889 Other specified postprocedural states: Secondary | ICD-10-CM | POA: Insufficient documentation

## 2012-06-28 DIAGNOSIS — Z87442 Personal history of urinary calculi: Secondary | ICD-10-CM | POA: Insufficient documentation

## 2012-06-28 DIAGNOSIS — Z8659 Personal history of other mental and behavioral disorders: Secondary | ICD-10-CM | POA: Insufficient documentation

## 2012-06-28 DIAGNOSIS — M546 Pain in thoracic spine: Secondary | ICD-10-CM | POA: Insufficient documentation

## 2012-06-28 DIAGNOSIS — F17200 Nicotine dependence, unspecified, uncomplicated: Secondary | ICD-10-CM | POA: Insufficient documentation

## 2012-06-28 DIAGNOSIS — I10 Essential (primary) hypertension: Secondary | ICD-10-CM | POA: Insufficient documentation

## 2012-06-28 DIAGNOSIS — Z8739 Personal history of other diseases of the musculoskeletal system and connective tissue: Secondary | ICD-10-CM | POA: Insufficient documentation

## 2012-06-28 DIAGNOSIS — Z79899 Other long term (current) drug therapy: Secondary | ICD-10-CM | POA: Insufficient documentation

## 2012-06-28 DIAGNOSIS — M549 Dorsalgia, unspecified: Secondary | ICD-10-CM

## 2012-06-28 MED ORDER — HYDROCODONE-ACETAMINOPHEN 5-325 MG PO TABS: 1.0000 | ORAL_TABLET | Freq: Four times a day (QID) | ORAL | Status: DC | PRN

## 2012-06-28 NOTE — ED Notes (Signed)
Pt presents w/ upper thoracic and cervical neck pain. Denies falling or any recent injury. Pain started at 0700 this a.m.; pt states took Tylenol and used ice w/o relief.

## 2012-06-28 NOTE — ED Provider Notes (Signed)
History    This chart was scribed for non-physician practitioner working with [name], MD by Leone Payor, ED Scribe. This patient was seen in room WTR6/WTR6 and the patient's care was started at 1553.   CSN: [id number]  Arrival date & time 06/28/12  1553   First MD Initiated Contact with Patient 06/28/12 1714      Chief Complaint  Patient presents with  . Back Pain     Patient is a 62 y.o. female presenting with back pain.  Back Pain Location:  Thoracic spine Radiates to:  Does not radiate Pain severity:  Moderate Pain is:  Same all the time Duration:  8 hours Timing:  Constant Progression:  Unchanged Chronicity:  New Context: not falling, not lifting heavy objects and not recent injury   Relieved by:  Nothing Risk factors: no hx of cancer     [name] is a 62 y.o. female who presents to the Emergency Department complaining of constant, upper thoracic back pain onset about 7-8 hours ago. Pt denies falling or any recent injury that could have caused this pain. Reports having back surgery for scoliosis in the past.  Surgery peformed by Dr. Noel Gerold.   She has taken OTC pain medications for the pain without relief.  She denies fever, chills, numbness, tingling, change in bowel or bladder function. Denies h/o cancer. Pt is a current everyday smoker but denies alcohol or street drug use.     Past Medical History  Diagnosis Date  . Hypertension   . Renal disorder kidney stones  . Depression     Past Surgical History  Procedure Laterality Date  . Abdominal hysterectomy    . Cholecystectomy    . Kidney stones    . Back surgery      Family History  Problem Relation Age of Onset  . Alzheimer's disease Mother   . Heart failure Mother   . Stroke Father   . Coronary artery disease Sister   . Coronary artery disease Sister   . Coronary artery disease Sister     History  Substance Use Topics  . Smoking status: Current Some Day Smoker -- 0.10 packs/day     Types: Cigarettes  . Smokeless tobacco: Never Used  . Alcohol Use: No    OB History   Grav Para Term Preterm Abortions TAB SAB Ect Mult Living                  Review of Systems  Musculoskeletal: Positive for back pain.   A complete 10 system review of systems was obtained and all systems are negative except as noted in the HPI and PMH.   Allergies  Compazine; Influenza vaccines; Metoclopramide hcl; and Zofran  Home Medications   Current Outpatient Rx  Name  Route  Sig  Dispense  Refill  . methocarbamol (ROBAXIN) 500 MG tablet   Oral   Take 1 tablet (500 mg total) by mouth 2 (two) times daily.   20 tablet   0   . traMADol (ULTRAM) 50 MG tablet   Oral   Take 1 tablet (50 mg total) by mouth every 6 (six) hours as needed for pain.   15 tablet   0     BP 169/104  Pulse 100  Resp 16  SpO2 96%  Physical Exam  Nursing note and vitals reviewed. Constitutional: She is oriented to person, place, and time. She appears well-developed and well-nourished. No distress.  HENT:  Head: Normocephalic and atraumatic.  Eyes: EOM are normal.  Neck: Normal range of motion. Neck supple. No tracheal deviation present.  Cardiovascular: Normal rate, regular rhythm and normal heart sounds.   Pulmonary/Chest: Effort normal and breath sounds normal. No respiratory distress. She has no wheezes. She has no rales.  Musculoskeletal: Normal range of motion. She exhibits tenderness.       Cervical back: She exhibits normal range of motion, no tenderness, no bony tenderness, no swelling, no edema and no deformity.       Thoracic back: She exhibits bony tenderness. She exhibits normal range of motion, no swelling and no edema.       Lumbar back: She exhibits normal range of motion, no tenderness, no bony tenderness, no swelling, no edema and no deformity.  Tenderness to palpation of the thoracic spine.   Neurological: She is alert and oriented to person, place, and time. She has normal strength and  normal reflexes. No sensory deficit. Gait normal.  Good patellar and achilles reflexes.  Grip strength 5/5 bilaterally  Skin: Skin is warm and dry.  Psychiatric: She has a normal mood and affect. Her behavior is normal.    ED Course  Procedures (including critical care time)  DIAGNOSTIC STUDIES: Oxygen Saturation is 96% on room air, adequate by my interpretation.    COORDINATION OF CARE: 5:59 PM-Discussed treatment plan with pt at bedside and pt agreed to plan.    Labs Reviewed - No data to display Dg Thoracic Spine 2 View  06/28/2012  *RADIOLOGY REPORT*  Clinical Data: Right upper back pain.  Prior thoracic spine surgery.  THORACIC SPINE - 2 VIEW  Comparison: 06/25/2011  Findings: Posterolateral rod and laminar hooks fixation of the upper thoracic spine observed with associated upper thoracic kyphosis but with lessening of the degree of scoliosis in the upper thoracic spine compared to preoperative CT from 2011.  The residual scoliosis causes indistinctness of vertebral endplate margins in the upper cervical spine despite true lateral positioning.  I do not observe obvious subluxation or obvious fracture, but cross-sectional imaging would be required to ensure the integrity of the endplates.  No definite compression observed on the frontal projection, although the end plates are indistinct even on the frontal projection in the upper thoracic spine.  No pleural effusion noted.  Mid and lower thoracic spine appear normal.  IMPRESSION:  1.  Mid lower thoracic spine appear normal.  Upper thoracic spine is harder to clear due to the degree of underlying scoliosis, postoperative findings, and rod and laminar hook fixation. Although I do not observe discrete subluxation or an obvious fracture in this vicinity, the cortical and endplate margins are indistinct - consider CT if there is specific concern for injury in the upper thoracic spine.   Original Report Authenticated By: Gaylyn Rong, M.D.       No diagnosis found.    MDM  Patient with a history of Scoliosis presents today with upper back pain back pain.  No neurological deficits and normal neuro exam.  Patient can walk but states is painful.  No loss of bowel or bladder control.  No concern for cauda equina.  No fever, night sweats, weight loss, h/o cancer, IVDU.  RICE protocol and pain medicine indicated and discussed with patient.   I personally performed the services described in this documentation, which was scribed in my presence. The recorded information has been reviewed and is accurate.   Pascal Lux [place], PA-C 06/29/12 [phone]

## 2012-06-29 NOTE — ED Provider Notes (Signed)
Medical screening examination/treatment/procedure(s) were performed by non-physician practitioner and as supervising physician I was immediately available for consultation/collaboration.  [name], MD 06/29/12 1524

## 2012-07-17 ENCOUNTER — Emergency Department (HOSPITAL_COMMUNITY)
Admission: EM | Admit: 2012-07-17 | Discharge: 2012-07-17 | Disposition: A | Discharge status: Home / Self Care | Payer: Medicare Other | Attending: Emergency Medicine | Admitting: Emergency Medicine

## 2012-07-17 ENCOUNTER — Encounter (HOSPITAL_COMMUNITY): Payer: Self-pay | Admitting: Emergency Medicine

## 2012-07-17 ENCOUNTER — Emergency Department (HOSPITAL_COMMUNITY): Payer: Medicare Other

## 2012-07-17 ENCOUNTER — Other Ambulatory Visit: Payer: Self-pay

## 2012-07-17 DIAGNOSIS — R51 Headache: Secondary | ICD-10-CM

## 2012-07-17 DIAGNOSIS — I10 Essential (primary) hypertension: Secondary | ICD-10-CM | POA: Insufficient documentation

## 2012-07-17 DIAGNOSIS — Z87442 Personal history of urinary calculi: Secondary | ICD-10-CM | POA: Insufficient documentation

## 2012-07-17 DIAGNOSIS — Z8659 Personal history of other mental and behavioral disorders: Secondary | ICD-10-CM | POA: Insufficient documentation

## 2012-07-17 DIAGNOSIS — R4182 Altered mental status, unspecified: Secondary | ICD-10-CM | POA: Insufficient documentation

## 2012-07-17 DIAGNOSIS — F17200 Nicotine dependence, unspecified, uncomplicated: Secondary | ICD-10-CM | POA: Insufficient documentation

## 2012-07-17 LAB — CBC WITH DIFFERENTIAL/PLATELET
Basophils Absolute: 0 10*3/uL (ref 0.0–0.1)
Eosinophils Relative: 5 % (ref 0–5)
HCT: 38.2 % (ref 36.0–46.0)
Hemoglobin: 13.3 g/dL (ref 12.0–15.0)
Lymphocytes Relative: 23 % (ref 12–46)
Lymphs Abs: 0.8 10*3/uL (ref 0.7–4.0)
MCV: 89 fL (ref 78.0–100.0)
Monocytes Absolute: 0.4 10*3/uL (ref 0.1–1.0)
Neutro Abs: 2.2 10*3/uL (ref 1.7–7.7)
RBC: 4.29 MIL/uL (ref 3.87–5.11)
WBC: 3.7 10*3/uL — ABNORMAL LOW (ref 4.0–10.5)

## 2012-07-17 LAB — COMPREHENSIVE METABOLIC PANEL
Albumin: 3.9 g/dL (ref 3.5–5.2)
Alkaline Phosphatase: 76 U/L (ref 39–117)
BUN: 14 mg/dL (ref 6–23)
Chloride: 102 mEq/L (ref 96–112)
Sodium: 141 mEq/L (ref 135–145)
Total Protein: 7.1 g/dL (ref 6.0–8.3)

## 2012-07-17 LAB — ETHANOL: Alcohol, Ethyl (B): <11 mg/dL (ref 0–11)

## 2012-07-17 MED ORDER — SODIUM CHLORIDE 0.9 % IV BOLUS (SEPSIS)
500.0000 mL | Freq: Once | INTRAVENOUS | Status: AC
  Administered 2012-07-17: 500 mL via INTRAVENOUS

## 2012-07-17 MED ORDER — MORPHINE SULFATE 4 MG/ML IJ SOLN
4.0000 mg | Freq: Once | INTRAMUSCULAR | Status: AC
  Administered 2012-07-17: 4 mg via INTRAVENOUS
  Filled 2012-07-17: qty 1

## 2012-07-17 MED ORDER — PROMETHAZINE HCL 25 MG/ML IJ SOLN
25.0000 mg | Freq: Once | INTRAMUSCULAR | Status: AC
  Administered 2012-07-17: 25 mg via INTRAVENOUS
  Filled 2012-07-17: qty 1

## 2012-07-17 MED ORDER — LORAZEPAM 2 MG/ML IJ SOLN
1.0000 mg | Freq: Once | INTRAMUSCULAR | Status: AC
  Administered 2012-07-17: 1 mg via INTRAVENOUS
  Filled 2012-07-17: qty 1

## 2012-07-17 MED ORDER — SODIUM CHLORIDE 0.9 % IV SOLN
INTRAVENOUS | Status: DC
  Administered 2012-07-17: 125 mL/h via INTRAVENOUS

## 2012-07-17 MED ORDER — POTASSIUM CHLORIDE CRYS ER 20 MEQ PO TBCR
40.0000 meq | EXTENDED_RELEASE_TABLET | Freq: Once | ORAL | Status: AC
  Administered 2012-07-17: 40 meq via ORAL
  Filled 2012-07-17: qty 2

## 2012-07-17 NOTE — ED Provider Notes (Signed)
History     CSN: [id number]  Arrival date & time 07/17/12  1150   First MD Initiated Contact with Patient 07/17/12 1207      Chief Complaint  Patient presents with  . Headache  . Altered Mental Status    (Consider location/radiation/quality/duration/timing/severity/associated sxs/prior treatment) Patient is a 62 y.o. female presenting with headaches and altered mental status. The history is provided by the patient.  Headache Altered Mental Status Associated symptoms include headaches.   patient here complaining of headache this morning which woke her from sleep about 9:00 with associated vomiting confusion. She notes whole-body weakness. Does have a history of headaches but not as severe. No fever or chills. No neck pain. Location of the headache is at her right parietal region. Denies any eye pain or visual changes. Symptoms have been progressively worse. Denies any photophobia. Took tylenol without relief  Past Medical History  Diagnosis Date  . Hypertension   . Renal disorder kidney stones  . Depression     Past Surgical History  Procedure Laterality Date  . Abdominal hysterectomy    . Cholecystectomy    . Kidney stones    . Back surgery      Family History  Problem Relation Age of Onset  . Alzheimer's disease Mother   . Heart failure Mother   . Stroke Father   . Coronary artery disease Sister   . Coronary artery disease Sister   . Coronary artery disease Sister     History  Substance Use Topics  . Smoking status: Current Some Day Smoker -- 0.10 packs/day    Types: Cigarettes  . Smokeless tobacco: Never Used  . Alcohol Use: No    OB History   Grav Para Term Preterm Abortions TAB SAB Ect Mult Living                  Review of Systems  Neurological: Positive for headaches.  Psychiatric/Behavioral: Positive for altered mental status.  All other systems reviewed and are negative.    Allergies  Compazine; Influenza vaccines; Metoclopramide hcl; and  Zofran  Home Medications   Current Outpatient Rx  Name  Route  Sig  Dispense  Refill  . HYDROcodone-acetaminophen (NORCO/VICODIN) 5-325 MG per tablet   Oral   Take 1-2 tablets by mouth every 6 (six) hours as needed for pain.   20 tablet   0   . methocarbamol (ROBAXIN) 500 MG tablet   Oral   Take 1 tablet (500 mg total) by mouth 2 (two) times daily.   20 tablet   0   . traMADol (ULTRAM) 50 MG tablet   Oral   Take 1 tablet (50 mg total) by mouth every 6 (six) hours as needed for pain.   15 tablet   0     BP 156/102  Pulse 99  Temp(Src) 98.4 F (36.9 C) (Oral)  Resp 16  Ht 5\' 2"  (1.575 m)  Wt 95 lb (43.092 kg)  BMI 17.37 kg/m2  SpO2 96%  Physical Exam  Nursing note and vitals reviewed. Constitutional: She is oriented to person, place, and time. She appears well-developed and well-nourished.  Non-toxic appearance. No distress.  HENT:  Head: Normocephalic and atraumatic.  Eyes: Conjunctivae, EOM and lids are normal. Pupils are equal, round, and reactive to light.  Neck: Normal range of motion. Neck supple. No tracheal deviation present. No mass present.  Cardiovascular: Normal rate, regular rhythm and normal heart sounds.  Exam reveals no gallop.   No murmur  heard. Pulmonary/Chest: Effort normal and breath sounds normal. No stridor. No respiratory distress. She has no decreased breath sounds. She has no wheezes. She has no rhonchi. She has no rales.  Abdominal: Soft. Normal appearance and bowel sounds are normal. She exhibits no distension. There is no tenderness. There is no rebound and no CVA tenderness.  Musculoskeletal: Normal range of motion. She exhibits no edema and no tenderness.  Neurological: She is alert and oriented to person, place, and time. She has normal strength. No cranial nerve deficit or sensory deficit. GCS eye subscore is 4. GCS verbal subscore is 5. GCS motor subscore is 6.  Skin: Skin is warm and dry. No abrasion and no rash noted.  Psychiatric:  She has a normal mood and affect. Her speech is normal and behavior is normal.    ED Course  Procedures (including critical care time)  Labs Reviewed  CBC WITH DIFFERENTIAL  COMPREHENSIVE METABOLIC PANEL  ETHANOL  URINE RAPID DRUG SCREEN (HOSP PERFORMED)   No results found.   No diagnosis found.    MDM  Patient given IV fluids and pain medication and feels better at this time. Her head CT was negative and because she presented within 6 hours I will. not performing a lumbar puncture this time. I do not believe that patient has a subarachnoid hemorrhage. Neurological exam stable. She has no neck pain or stiffness or tenderness. She has no photophobia. Her mental status is normal. She's had relief with medications no discharge at home        Toy Baker, MD 07/17/12 1441

## 2012-07-17 NOTE — ED Notes (Signed)
Pt c/o sudden headache with confusion that started at 1100.  Pt reports feeling weak.  Pt is alert to person, place and time.

## 2012-07-17 NOTE — ED Notes (Signed)
Pt is vomiting at this time.

## 2012-07-26 ENCOUNTER — Emergency Department (HOSPITAL_COMMUNITY)
Admission: EM | Admit: 2012-07-26 | Discharge: 2012-07-26 | Disposition: A | Discharge status: Home / Self Care | Payer: Medicare Other | Attending: Emergency Medicine | Admitting: Emergency Medicine

## 2012-07-26 ENCOUNTER — Emergency Department (HOSPITAL_COMMUNITY): Payer: Medicare Other

## 2012-07-26 DIAGNOSIS — R071 Chest pain on breathing: Secondary | ICD-10-CM | POA: Insufficient documentation

## 2012-07-26 DIAGNOSIS — Z87442 Personal history of urinary calculi: Secondary | ICD-10-CM | POA: Insufficient documentation

## 2012-07-26 DIAGNOSIS — R0789 Other chest pain: Secondary | ICD-10-CM

## 2012-07-26 DIAGNOSIS — F17200 Nicotine dependence, unspecified, uncomplicated: Secondary | ICD-10-CM | POA: Insufficient documentation

## 2012-07-26 DIAGNOSIS — I10 Essential (primary) hypertension: Secondary | ICD-10-CM | POA: Insufficient documentation

## 2012-07-26 DIAGNOSIS — Z8659 Personal history of other mental and behavioral disorders: Secondary | ICD-10-CM | POA: Insufficient documentation

## 2012-07-26 LAB — BASIC METABOLIC PANEL
BUN: 12 mg/dL (ref 6–23)
Chloride: 102 mEq/L (ref 96–112)
Glucose, Bld: 104 mg/dL — ABNORMAL HIGH (ref 70–99)
Potassium: 3.4 mEq/L — ABNORMAL LOW (ref 3.5–5.1)

## 2012-07-26 LAB — CBC
HCT: 37.8 % (ref 36.0–46.0)
Hemoglobin: 13.3 g/dL (ref 12.0–15.0)
MCHC: 35.2 g/dL (ref 30.0–36.0)
WBC: 4.6 10*3/uL (ref 4.0–10.5)

## 2012-07-26 NOTE — ED Notes (Signed)
Patient wanted something for pain so she was given an ice pack for her muscle pain.

## 2012-07-26 NOTE — ED Notes (Signed)
Phlebotomy called about lab draws and pt being back in room since Xray

## 2012-07-26 NOTE — ED Provider Notes (Signed)
History     CSN: [id number]  Arrival date & time 07/26/12  2056   First MD Initiated Contact with Patient 07/26/12 2116      Chief Complaint  Patient presents with  . Chest Pain    (Consider location/radiation/quality/duration/timing/severity/associated sxs/prior treatment) HPI... intermittent sharp chest pain left chest since 4:30 PM, worse with inspiration. No dyspnea, nausea, diaphoresis.  No previous cardiac disease. Smokes 2 cigarettes a day.  Severity is mild. No radiation of pain.  Past Medical History  Diagnosis Date  . Hypertension   . Renal disorder kidney stones  . Depression     Past Surgical History  Procedure Laterality Date  . Abdominal hysterectomy    . Cholecystectomy    . Kidney stones    . Back surgery      Family History  Problem Relation Age of Onset  . Alzheimer's disease Mother   . Heart failure Mother   . Stroke Father   . Coronary artery disease Sister   . Coronary artery disease Sister   . Coronary artery disease Sister     History  Substance Use Topics  . Smoking status: Current Some Day Smoker -- 0.10 packs/day    Types: Cigarettes  . Smokeless tobacco: Never Used  . Alcohol Use: No    OB History   Grav Para Term Preterm Abortions TAB SAB Ect Mult Living                  Review of Systems  All other systems reviewed and are negative.    Allergies  Compazine; Influenza vaccines; Metoclopramide hcl; and Zofran  Home Medications   Current Outpatient Rx  Name  Route  Sig  Dispense  Refill  . acetaminophen (TYLENOL) 500 MG tablet   Oral   Take 1,000 mg by mouth every 6 (six) hours as needed for pain.           BP 130/97  Pulse 66  Temp(Src) 98.7 F (37.1 C) (Oral)  Resp 13  SpO2 95%  Physical Exam  Nursing note and vitals reviewed. Constitutional: She is oriented to person, place, and time. She appears well-developed and well-nourished.  HENT:  Head: Normocephalic and atraumatic.  Eyes: Conjunctivae and EOM  are normal. Pupils are equal, round, and reactive to light.  Neck: Normal range of motion. Neck supple.  Cardiovascular: Normal rate, regular rhythm and normal heart sounds.   Pulmonary/Chest: Effort normal and breath sounds normal.  Moderate tenderness left chest wall  Abdominal: Soft. Bowel sounds are normal.  Musculoskeletal: Normal range of motion.  Neurological: She is alert and oriented to person, place, and time.  Skin: Skin is warm and dry.  Psychiatric: She has a normal mood and affect.    ED Course  Procedures (including critical care time)  Labs Reviewed  BASIC METABOLIC PANEL - Abnormal; Notable for the following:    Potassium 3.4 (*)    Glucose, Bld 104 (*)    GFR calc non Af Amer 76 (*)    GFR calc Af Amer 88 (*)    All other components within normal limits  CBC  POCT I-STAT TROPONIN I   Dg Chest 2 View  07/26/2012   *RADIOLOGY REPORT*  Clinical Data: Chest pain and hypertension  CHEST - 2 VIEW  Comparison: Thoracic spine films 04/27 1014 the  Findings: Normal mediastinum and heart silhouette.  Posterior thoracic fusion noted.  No effusion, infiltrate, or pneumothorax.  IMPRESSION: No acute cardiopulmonary process.   Original Report  Authenticated By: Genevive Bi, M.D.     No diagnosis found.   Date: 07/26/2012  Rate: 68  Rhythm: normal sinus rhythm  QRS Axis: normal  Intervals: normal  ST/T Wave abnormalities: normal  Conduction Disutrbances: none  Narrative Interpretation: unremarkable     MDM  History and physical more consistent with chest wall pain. Screening EKG, chest x-ray, troponin negative.        Donnetta Hutching, MD 07/26/12 [phone]

## 2012-07-26 NOTE — ED Notes (Addendum)
Per EMS: Pt from home reporting gradaul onset left sided 7/10 CP starting at 2000 tonight. Pt reports nausea. AO x4. Pt reports pain increases with deep breath. 126/76. 78 NSR. 16 RR. 100% RA. Unremarkable EKG. 324 aspirin given en route.

## 2012-07-27 ENCOUNTER — Emergency Department (HOSPITAL_COMMUNITY)
Admission: EM | Admit: 2012-07-27 | Discharge: 2012-07-27 | Disposition: A | Discharge status: Home / Self Care | Payer: Medicare Other | Source: Home / Self Care | Attending: Emergency Medicine | Admitting: Emergency Medicine

## 2012-07-27 ENCOUNTER — Observation Stay (HOSPITAL_COMMUNITY)
Admission: EM | Admit: 2012-07-27 | Discharge: 2012-07-29 | Disposition: A | Discharge status: Home / Self Care | Payer: Medicare Other | Attending: Internal Medicine | Admitting: Internal Medicine

## 2012-07-27 ENCOUNTER — Encounter (HOSPITAL_COMMUNITY): Payer: Self-pay

## 2012-07-27 DIAGNOSIS — R0789 Other chest pain: Secondary | ICD-10-CM

## 2012-07-27 DIAGNOSIS — R071 Chest pain on breathing: Secondary | ICD-10-CM | POA: Insufficient documentation

## 2012-07-27 DIAGNOSIS — R911 Solitary pulmonary nodule: Secondary | ICD-10-CM

## 2012-07-27 DIAGNOSIS — F17200 Nicotine dependence, unspecified, uncomplicated: Secondary | ICD-10-CM | POA: Insufficient documentation

## 2012-07-27 DIAGNOSIS — Z720 Tobacco use: Secondary | ICD-10-CM | POA: Diagnosis present

## 2012-07-27 DIAGNOSIS — K5289 Other specified noninfective gastroenteritis and colitis: Secondary | ICD-10-CM | POA: Insufficient documentation

## 2012-07-27 DIAGNOSIS — Z8659 Personal history of other mental and behavioral disorders: Secondary | ICD-10-CM | POA: Insufficient documentation

## 2012-07-27 DIAGNOSIS — F329 Major depressive disorder, single episode, unspecified: Secondary | ICD-10-CM | POA: Diagnosis present

## 2012-07-27 DIAGNOSIS — R110 Nausea: Secondary | ICD-10-CM | POA: Insufficient documentation

## 2012-07-27 DIAGNOSIS — F32A Depression, unspecified: Secondary | ICD-10-CM | POA: Diagnosis present

## 2012-07-27 DIAGNOSIS — F3289 Other specified depressive episodes: Secondary | ICD-10-CM | POA: Insufficient documentation

## 2012-07-27 DIAGNOSIS — M549 Dorsalgia, unspecified: Secondary | ICD-10-CM

## 2012-07-27 DIAGNOSIS — I10 Essential (primary) hypertension: Secondary | ICD-10-CM | POA: Insufficient documentation

## 2012-07-27 DIAGNOSIS — R079 Chest pain, unspecified: Secondary | ICD-10-CM

## 2012-07-27 DIAGNOSIS — Z5900 Homelessness unspecified: Secondary | ICD-10-CM | POA: Insufficient documentation

## 2012-07-27 DIAGNOSIS — R197 Diarrhea, unspecified: Secondary | ICD-10-CM

## 2012-07-27 DIAGNOSIS — Z87448 Personal history of other diseases of urinary system: Secondary | ICD-10-CM | POA: Insufficient documentation

## 2012-07-27 LAB — POCT I-STAT TROPONIN I: Troponin i, poc: 0.02 ng/mL (ref 0.00–0.08)

## 2012-07-27 MED ORDER — PROMETHAZINE HCL 25 MG/ML IJ SOLN
25.0000 mg | Freq: Once | INTRAMUSCULAR | Status: DC
  Filled 2012-07-27: qty 1

## 2012-07-27 MED ORDER — IBUPROFEN 600 MG PO TABS: 600.0000 mg | ORAL_TABLET | Freq: Four times a day (QID) | ORAL | Status: DC | PRN

## 2012-07-27 MED ORDER — PROMETHAZINE HCL 25 MG/ML IJ SOLN
12.5000 mg | Freq: Once | INTRAMUSCULAR | Status: AC
  Administered 2012-07-27: 12.5 mg via INTRAVENOUS
  Filled 2012-07-27: qty 1

## 2012-07-27 MED ORDER — FENTANYL CITRATE 0.05 MG/ML IJ SOLN
25.0000 ug | Freq: Once | INTRAMUSCULAR | Status: AC
  Administered 2012-07-27: 25 ug via INTRAVENOUS
  Filled 2012-07-27: qty 2

## 2012-07-27 MED ORDER — PROMETHAZINE HCL 25 MG RE SUPP: 25.0000 mg | Freq: Four times a day (QID) | RECTAL | Status: DC | PRN

## 2012-07-27 NOTE — ED Provider Notes (Signed)
History     CSN: [id number]  Arrival date & time 07/27/12  1711   First MD Initiated Contact with Patient 07/27/12 1731      Chief Complaint  Patient presents with  . chest wall pain      HPI Per EMS, Pt c/o chest wall pain starting this AM. Sts pain with palpitation. Pt was seen at [facility] this morning with same complaint. Pain score 8/10. NAD noted. A & Ox4. EMS sts Pt is homeless.   Past Medical History  Diagnosis Date  . Hypertension   . Renal disorder kidney stones  . Depression     Past Surgical History  Procedure Laterality Date  . Abdominal hysterectomy    . Cholecystectomy    . Kidney stones    . Back surgery      Family History  Problem Relation Age of Onset  . Alzheimer's disease Mother   . Heart failure Mother   . Stroke Father   . Coronary artery disease Sister   . Coronary artery disease Sister   . Coronary artery disease Sister     History  Substance Use Topics  . Smoking status: Current Some Day Smoker -- 0.10 packs/day    Types: Cigarettes  . Smokeless tobacco: Never Used  . Alcohol Use: No    OB History   Grav Para Term Preterm Abortions TAB SAB Ect Mult Living                  Review of Systems  Allergies  Compazine; Influenza vaccines; Metoclopramide hcl; and Zofran  Home Medications   Current Outpatient Rx  Name  Route  Sig  Dispense  Refill  . acetaminophen (TYLENOL) 500 MG tablet   Oral   Take 1,000 mg by mouth every 6 (six) hours as needed for pain.         Marland Kitchen diclofenac sodium (VOLTAREN) 1 % GEL   Topical   Apply 2 g topically 4 (four) times daily. FOR BACK PAIN         . promethazine (PHENERGAN) 25 MG suppository   Rectal   Place 1 suppository (25 mg total) rectally every 6 (six) hours as needed for nausea.   12 each   0     BP 152/86  Pulse 72  Temp(Src) 98.9 F (37.2 C) (Oral)  Resp 16  SpO2 95%  Physical Exam  Nursing note and vitals reviewed. Constitutional: She is oriented to person, place, and  time. She appears well-developed and well-nourished. No distress.  HENT:  Head: Normocephalic and atraumatic.  Eyes: Pupils are equal, round, and reactive to light.  Neck: Normal range of motion.  Cardiovascular: Normal rate and intact distal pulses.   Pulmonary/Chest: No respiratory distress.    Areas indicate reproducible chest wall pain  Abdominal: Normal appearance. She exhibits no distension.  Musculoskeletal: Normal range of motion.  Neurological: She is alert and oriented to person, place, and time. No cranial nerve deficit.  Skin: Skin is warm and dry. No rash noted.  Psychiatric: She has a normal mood and affect. Her behavior is normal.    ED Course  Procedures (including critical care time)  Date: 07/27/2012  Rate: 70  Rhythm: normal sinus rhythm  QRS Axis: normal  Intervals: normal  ST/T Wave abnormalities: Nonspecific changes  Conduction Disutrbances: none  Narrative Interpretation: Nonspecific T wave changes which are non-change from previous tracings     Labs Reviewed  POCT I-STAT TROPONIN I   Dg Chest 2  View  07/26/2012   *RADIOLOGY REPORT*  Clinical Data: Chest pain and hypertension  CHEST - 2 VIEW  Comparison: Thoracic spine films 04/27 1014 the  Findings: Normal mediastinum and heart silhouette.  Posterior thoracic fusion noted.  No effusion, infiltrate, or pneumothorax.  IMPRESSION: No acute cardiopulmonary process.   Original Report Authenticated By: Genevive Bi, M.D.     1. Chest wall pain       MDM          Nelia Shi, MD 07/27/12 2040

## 2012-07-27 NOTE — ED Notes (Signed)
MD at bedside. 

## 2012-07-27 NOTE — ED Notes (Signed)
IV patency checked with 10 mL NS prior to administration of medication.  Flushed well, no s/s of swelling or infiltration.

## 2012-07-27 NOTE — H&P (Signed)
[facility] History and Physical  [name] AOZ:[id number] DOB: [birth year]    PCP:   Quentin Mulling, MD   Chief Complaint: leftsided chest pain.  HPI: [name] is an 62 y.o. female  With hx of HTN, depression, s/p CCY, returned to [facility] ER with persistent leftsided chest pain.  She was seen at [facility] ER and had cardiology consulted, stress test, and was felt to be chest wall pain as the etiology.  It is palpable tenderness over the left ribs.  She has no shortness of breath, nausea, vomiting.  She is however, homeless, as a friend she has been living with no longer allow her to live there.  She was dropped off at the emergency room, and her friend can't be contacted.  During the memorial weekend, there is no social service available and it was felt that she cannot be safely discharged.  The EDP asked hospitalist to admit her under OBS for proper disposition.  Shelters were called and none were available.   Rewiew of Systems:  Constitutional: Negative for malaise, fever and chills. No significant weight loss or weight gain Eyes: Negative for eye pain, redness and discharge, diplopia, visual changes, or flashes of light. ENMT: Negative for ear pain, hoarseness, nasal congestion, sinus pressure and sore throat. No headaches; tinnitus, drooling, or problem swallowing. Cardiovascular: Negative for  palpitations, diaphoresis, dyspnea and peripheral edema. ; No orthopnea, PND Respiratory: Negative for cough, hemoptysis, wheezing and stridor. No pleuritic chestpain. Gastrointestinal: Negative for nausea, vomiting, diarrhea, constipation, abdominal pain, melena, blood in stool, hematemesis, jaundice and rectal bleeding.    Genitourinary: Negative for frequency, dysuria, incontinence,flank pain and hematuria; Musculoskeletal: Negative for back pain and neck pain. Negative for swelling and trauma.;  Skin: . Negative for pruritus, rash, abrasions, bruising and skin lesion.; ulcerations Neuro:  Negative for headache, lightheadedness and neck stiffness. Negative for weakness, altered level of consciousness , altered mental status, extremity weakness, burning feet, involuntary movement, seizure and syncope.  Psych: negative for anxiety, depression, insomnia, tearfulness, panic attacks, hallucinations, paranoia, suicidal or homicidal ideation    Past Medical History  Diagnosis Date  . Hypertension   . Renal disorder kidney stones  . Depression     Past Surgical History  Procedure Laterality Date  . Abdominal hysterectomy    . Cholecystectomy    . Kidney stones    . Back surgery      Medications:  HOME MEDS: Prior to Admission medications   Medication Sig Start Date End Date Taking? Authorizing Provider  acetaminophen (TYLENOL) 500 MG tablet Take 1,000 mg by mouth every 6 (six) hours as needed for pain.   Yes Historical Provider, MD  diclofenac sodium (VOLTAREN) 1 % GEL Apply 2 g topically 4 (four) times daily. FOR BACK PAIN   Yes Historical Provider, MD  promethazine (PHENERGAN) 25 MG suppository Place 1 suppository (25 mg total) rectally every 6 (six) hours as needed for nausea. 07/27/12  Yes Shari A Upstill, PA-C     Allergies:  Allergies  Allergen Reactions  . Compazine Rash    Gets spots on skin and has to get it flushed out of her system  . Influenza Vaccines Other (See Comments)    Got pneumonia, will not take vaccine  . Metoclopramide Hcl Hives  . Zofran Hives    Social History:   reports that she has been smoking Cigarettes.  She has been smoking about 0.10 packs per day. She has never used smokeless tobacco. She reports that she  does not drink alcohol or use illicit drugs.  Family History: Family History  Problem Relation Age of Onset  . Alzheimer's disease Mother   . Heart failure Mother   . Stroke Father   . Coronary artery disease Sister   . Coronary artery disease Sister   . Coronary artery disease Sister      Physical Exam: Filed Vitals:    07/27/12 1734  BP: 152/86  Pulse: 72  Temp: 98.9 F (37.2 C)  TempSrc: Oral  Resp: 16  SpO2: 95%   Blood pressure 152/86, pulse 72, temperature 98.9 F (37.2 C), temperature source Oral, resp. rate 16, SpO2 95.00%.  GEN:  Pleasant  patient lying in the stretcher in no acute distress; cooperative with exam. PSYCH:  alert and oriented x4; does not appear anxious or depressed; affect is appropriate. HEENT: Mucous membranes pink and anicteric; PERRLA; EOM intact; no cervical lymphadenopathy nor thyromegaly or carotid bruit; no JVD; There were no stridor. Neck is very supple. Breasts:: Not examined CHEST WALL:tenderness over the left chest wall. CHEST: Normal respiration, clear to auscultation bilaterally.  HEART: Regular rate and rhythm.  There are no murmur, rub, or gallops.   BACK: No kyphosis or scoliosis; no CVA tenderness ABDOMEN: soft and non-tender; no masses, no organomegaly, normal abdominal bowel sounds; no pannus; no intertriginous candida. There is no rebound and no distention. Rectal Exam: Not done EXTREMITIES: No bone or joint deformity; age-appropriate arthropathy of the hands and knees; no edema; no ulcerations.  There is no calf tenderness. Genitalia: not examined PULSES: 2+ and symmetric SKIN: Normal hydration no rash or ulceration CNS: Cranial nerves 2-12 grossly intact no focal lateralizing neurologic deficit.  Speech is fluent; uvula elevated with phonation, facial symmetry and tongue midline. DTR are normal bilaterally, cerebella exam is intact, barbinski is negative and strengths are equaled bilaterally.  No sensory loss.   Labs on Admission:  Basic Metabolic Panel:  Recent Labs Lab 07/26/12 2214  NA 139  K 3.4*  CL 102  CO2 27  GLUCOSE 104*  BUN 12  CREATININE 0.82  CALCIUM 9.7   Liver Function Tests: No results found for this basename: AST, ALT, ALKPHOS, BILITOT, PROT, ALBUMIN,  in the last 168 hours No results found for this basename: LIPASE,  AMYLASE,  in the last 168 hours No results found for this basename: AMMONIA,  in the last 168 hours CBC:  Recent Labs Lab 07/26/12 2214  WBC 4.6  HGB 13.3  HCT 37.8  MCV 89.6  PLT 207   Cardiac Enzymes: No results found for this basename: CKTOTAL, CKMB, CKMBINDEX, TROPONINI,  in the last 168 hours  CBG: No results found for this basename: GLUCAP,  in the last 168 hours   Radiological Exams on Admission: Dg Chest 2 View  07/26/2012   *RADIOLOGY REPORT*  Clinical Data: Chest pain and hypertension  CHEST - 2 VIEW  Comparison: Thoracic spine films 04/27 1014 the  Findings: Normal mediastinum and heart silhouette.  Posterior thoracic fusion noted.  No effusion, infiltrate, or pneumothorax.  IMPRESSION: No acute cardiopulmonary process.   Original Report Authenticated By: Genevive Bi, M.D.   Assessment/Plan Present on Admission:  . Chest wall pain . Hypertension . Tobacco use . Depression  PLAN: Will admit her to OBS and cycle her troponins, although I am almost certain this is not cardiac chest pain.  Will give her toradol for pain.  She denied any trauma, pleuritic component or SOB.  I have consulted social service to help  with disposition as she is homeless suddenly and has no place to go.  She is stable.  Thank you for asking me to help with her care.  Other plans as per orders.  Code Status: FULL Unk Lightning, MD. [facility] Pager [phone] 7pm to 7am.  07/27/2012, 10:40 PM

## 2012-07-27 NOTE — ED Notes (Signed)
[id number]> Expected date:<BR> Expected time:<BR> Means of arrival:<BR> Comments:<BR> ems - 62 yo F, seen at [facility] ED this am for chest pain, possible pysch issues per family

## 2012-07-27 NOTE — ED Notes (Addendum)
Per EMS, Pt c/o chest wall pain starting this AM.  Sts pain with palpitation.  Pt was seen at [facility] this morning with same complaint.  Pain score 8/10.  NAD noted.  A & Ox4.  EMS sts Pt is homeless.

## 2012-07-27 NOTE — ED Notes (Addendum)
Pt provided with crackers, peanut butter, and ginger ale.   

## 2012-07-27 NOTE — ED Notes (Signed)
Pt tolerated crackers and soda well.  No S/S of nausea/vomiting.

## 2012-07-27 NOTE — ED Notes (Signed)
Pt states her CP is increasing. Was just discharged from [facility] ER this morning. Lab results and x ray completed from last visit

## 2012-07-27 NOTE — ED Provider Notes (Signed)
History     CSN: [id number]  Arrival date & time 07/27/12  0445   First MD Initiated Contact with Patient 07/27/12 0602      Chief Complaint  Patient presents with  . Chest Pain    (Consider location/radiation/quality/duration/timing/severity/associated sxs/prior treatment) Patient is a 62 y.o. female presenting with chest pain. The history is provided by the patient.  Chest Pain Pain location:  L chest Associated symptoms: nausea   Associated symptoms: no abdominal pain, no cough, no fever, no headache, no numbness, no shortness of breath and not vomiting   Associated symptoms comment:  Left chest pain since yesterday. Onset while at rest. Intermittent pain without aggrevating or alleviating factors. No cough or fever. She was seen at [facility] last night for same and returns today because it worsened. Nausea without vomiting. She reports she has had similar pain, although not as severe as today and yesterday, for "a long time". She has seen Dr. Verdis Prime in the past and reports a negative stress test.  Risk factors: hypertension and smoking     Past Medical History  Diagnosis Date  . Hypertension   . Renal disorder kidney stones  . Depression     Past Surgical History  Procedure Laterality Date  . Abdominal hysterectomy    . Cholecystectomy    . Kidney stones    . Back surgery      Family History  Problem Relation Age of Onset  . Alzheimer's disease Mother   . Heart failure Mother   . Stroke Father   . Coronary artery disease Sister   . Coronary artery disease Sister   . Coronary artery disease Sister     History  Substance Use Topics  . Smoking status: Current Some Day Smoker -- 0.10 packs/day    Types: Cigarettes  . Smokeless tobacco: Never Used  . Alcohol Use: No    OB History   Grav Para Term Preterm Abortions TAB SAB Ect Mult Living                  Review of Systems  Constitutional: Negative for fever and chills.  HENT: Negative.  Negative  for neck pain.   Respiratory: Negative.  Negative for cough and shortness of breath.   Cardiovascular: Positive for chest pain.  Gastrointestinal: Positive for nausea. Negative for vomiting and abdominal pain.  Genitourinary: Negative for dysuria.  Musculoskeletal: Negative.   Skin: Negative.   Neurological: Negative.  Negative for light-headedness, numbness and headaches.  Psychiatric/Behavioral: Negative for confusion.    Allergies  Compazine; Influenza vaccines; Metoclopramide hcl; and Zofran  Home Medications   Current Outpatient Rx  Name  Route  Sig  Dispense  Refill  . acetaminophen (TYLENOL) 500 MG tablet   Oral   Take 1,000 mg by mouth every 6 (six) hours as needed for pain.           BP 139/93  Pulse 76  Temp(Src) 98.3 F (36.8 C) (Oral)  Resp 23  SpO2 100%  Physical Exam  Constitutional: She is oriented to person, place, and time. She appears well-developed and well-nourished. No distress.  HENT:  Head: Normocephalic.  Mouth/Throat: Oropharynx is clear and moist.  Neck: Normal range of motion.  Cardiovascular: Normal rate and regular rhythm.   No murmur heard. Pulmonary/Chest: Effort normal. She has no wheezes. She has no rales.  Abdominal: Soft. There is no tenderness. There is no rebound and no guarding.  Musculoskeletal: Normal range of motion. She exhibits  no edema.  Neurological: She is alert and oriented to person, place, and time.  Skin: Skin is warm and dry.  Psychiatric: She has a normal mood and affect.    ED Course  Procedures (including critical care time)  Labs Reviewed - No data to display Dg Chest 2 View  07/26/2012   *RADIOLOGY REPORT*  Clinical Data: Chest pain and hypertension  CHEST - 2 VIEW  Comparison: Thoracic spine films 04/27 1014 the  Findings: Normal mediastinum and heart silhouette.  Posterior thoracic fusion noted.  No effusion, infiltrate, or pneumothorax.  IMPRESSION: No acute cardiopulmonary process.   Original Report  Authenticated By: Genevive Bi, M.D.     No diagnosis found. 1. Chest wall pain   MDM  Chart reviewed. Cardiology consultations and stress tests have given diagnosis of chest wall pain. Remote negative cardiac cath in 2006 with no arterial disease. Negative CXR, EKG, troponin done 12 hours earlier. Repeat troponin negative. Given Phenergan for complaint of nausea. No vomiting. VSS. Pain worse with movement and palpation and atypical for ACS. Stable for discharge.        Arnoldo Hooker, PA-C 07/27/12 [phone]

## 2012-07-27 NOTE — ED Provider Notes (Signed)
Medical screening examination/treatment/procedure(s) were performed by non-physician practitioner and as supervising physician I was immediately available for consultation/collaboration. Devoria Albe, MD, Armando Gang    Ward Givens, MD 07/27/12 2255

## 2012-07-28 ENCOUNTER — Encounter (HOSPITAL_COMMUNITY): Payer: Self-pay | Admitting: *Deleted

## 2012-07-28 DIAGNOSIS — F17200 Nicotine dependence, unspecified, uncomplicated: Secondary | ICD-10-CM

## 2012-07-28 DIAGNOSIS — I10 Essential (primary) hypertension: Secondary | ICD-10-CM

## 2012-07-28 LAB — CREATININE, SERUM
Creatinine, Ser: 0.96 mg/dL (ref 0.50–1.10)
GFR calc Af Amer: 72 mL/min — ABNORMAL LOW (ref >90)
GFR calc non Af Amer: 63 mL/min — ABNORMAL LOW (ref >90)

## 2012-07-28 LAB — CBC
Hemoglobin: 13.2 g/dL (ref 12.0–15.0)
RBC: 4.33 MIL/uL (ref 3.87–5.11)
WBC: 4.8 10*3/uL (ref 4.0–10.5)

## 2012-07-28 LAB — TROPONIN I: Troponin I: <0.3 ng/mL (ref <0.30)

## 2012-07-28 MED ORDER — SODIUM CHLORIDE 0.9 % IJ SOLN
3.0000 mL | Freq: Two times a day (BID) | INTRAMUSCULAR | Status: DC
  Administered 2012-07-28 (×3): 3 mL via INTRAVENOUS
  Administered 2012-07-29: 10:00:00 via INTRAVENOUS

## 2012-07-28 MED ORDER — SODIUM CHLORIDE 0.9 % IJ SOLN: 3.0000 mL | INTRAMUSCULAR | Status: DC | PRN

## 2012-07-28 MED ORDER — TUBERCULIN PPD 5 UNIT/0.1ML ID SOLN
5.0000 [IU] | Freq: Once | INTRADERMAL | Status: DC
  Administered 2012-07-28: 5 [IU] via INTRADERMAL
  Filled 2012-07-28: qty 0.1

## 2012-07-28 MED ORDER — TRAMADOL HCL 50 MG PO TABS
25.0000 mg | ORAL_TABLET | Freq: Four times a day (QID) | ORAL | Status: DC
  Administered 2012-07-28 – 2012-07-29 (×6): 25 mg via ORAL
  Filled 2012-07-28 (×9): qty 1

## 2012-07-28 MED ORDER — PANTOPRAZOLE SODIUM 40 MG PO TBEC
40.0000 mg | DELAYED_RELEASE_TABLET | Freq: Every day | ORAL | Status: DC
  Administered 2012-07-28 – 2012-07-29 (×2): 40 mg via ORAL
  Filled 2012-07-28 (×3): qty 1

## 2012-07-28 MED ORDER — AMLODIPINE BESYLATE 5 MG PO TABS
5.0000 mg | ORAL_TABLET | Freq: Every day | ORAL | Status: DC
  Administered 2012-07-28 – 2012-07-29 (×2): 5 mg via ORAL
  Filled 2012-07-28 (×2): qty 1

## 2012-07-28 MED ORDER — SODIUM CHLORIDE 0.9 % IJ SOLN
3.0000 mL | Freq: Two times a day (BID) | INTRAMUSCULAR | Status: DC
  Administered 2012-07-28 – 2012-07-29 (×2): 3 mL via INTRAVENOUS

## 2012-07-28 MED ORDER — ENOXAPARIN SODIUM 40 MG/0.4ML ~~LOC~~ SOLN
40.0000 mg | SUBCUTANEOUS | Status: DC
  Administered 2012-07-28 – 2012-07-29 (×2): 40 mg via SUBCUTANEOUS
  Filled 2012-07-28 (×2): qty 0.4

## 2012-07-28 MED ORDER — ASPIRIN EC 81 MG PO TBEC
81.0000 mg | DELAYED_RELEASE_TABLET | Freq: Every day | ORAL | Status: DC
  Administered 2012-07-28 – 2012-07-29 (×2): 81 mg via ORAL
  Filled 2012-07-28 (×2): qty 1

## 2012-07-28 MED ORDER — ONDANSETRON HCL 4 MG/2ML IJ SOLN: 4.0000 mg | Freq: Four times a day (QID) | INTRAMUSCULAR | Status: DC | PRN

## 2012-07-28 MED ORDER — KETOROLAC TROMETHAMINE 30 MG/ML IJ SOLN
30.0000 mg | Freq: Four times a day (QID) | INTRAMUSCULAR | Status: DC | PRN
  Administered 2012-07-28: 30 mg via INTRAVENOUS
  Filled 2012-07-28: qty 1

## 2012-07-28 MED ORDER — SODIUM CHLORIDE 0.9 % IV SOLN: 250.0000 mL | INTRAVENOUS | Status: DC | PRN

## 2012-07-28 MED ORDER — ONDANSETRON HCL 4 MG PO TABS
4.0000 mg | ORAL_TABLET | Freq: Four times a day (QID) | ORAL | Status: DC | PRN
  Administered 2012-07-28: 4 mg via ORAL
  Filled 2012-07-28: qty 1

## 2012-07-28 MED ORDER — PROMETHAZINE HCL 25 MG/ML IJ SOLN
12.5000 mg | Freq: Four times a day (QID) | INTRAMUSCULAR | Status: DC | PRN
  Administered 2012-07-28 – 2012-07-29 (×4): 12.5 mg via INTRAVENOUS
  Filled 2012-07-28 (×4): qty 1

## 2012-07-28 NOTE — Progress Notes (Signed)
Patient admitted from the emergency room to 1407. Alert and oriented,  reviewed plan of care with patient, oriented patient to room/unit/hospital. Patient in room/bed resting. will continue to assess patient. Skin intact with old scabed scratch on the left lower leg, no drainage or any sign of infection noted.

## 2012-07-28 NOTE — Care Management Note (Addendum)
    Page 1 of 1   07/29/2012     4:04:59 PM   CARE MANAGEMENT NOTE 07/29/2012  Patient:  [name], [name]   Account Number:  [id number]  Date Initiated:  07/28/2012  Documentation initiated by:  Lanier Clam  Subjective/Objective Assessment:   ADMITTED W/CHEST PAIN.     Action/Plan:   STATES SHE IS HOMELESS.PRIMARY CONTACT-NEICE(ATENA NELSON)   Anticipated DC Date:  07/29/2012   Anticipated DC Plan:  HOME/SELF CARE      DC Planning Services  CM consult      Choice offered to / List presented to:             Status of service:  Completed, signed off Medicare Important Message given?   (If response is "NO", the following Medicare IM given date fields will be blank) Date Medicare IM given:   Date Additional Medicare IM given:    Discharge Disposition:  HOME/SELF CARE  Per UR Regulation:  Reviewed for med. necessity/level of care/duration of stay  If discussed at Long Length of Stay Meetings, dates discussed:    Comments:  07/29/12 Panfilo Ketchum RN,BSN NCM [phone] PATIENT HAD NO HH NEEDS,SINCE UNABLE TO ESTABLISH WHERE PATIENT WAS GOING. RECEIVED RESOURCES FOR COMMUNITY SERVICES-EMERGENCY-CRISIS ASSISTANCE.ALSO PROVIDED W/PCP LISTING AS RESOURCE.  07/28/12 Sharina Petre RN,BSN NCM [phone] PATIENT STATES SHE IS HOMELESS,FEELS HER NEICE WILL HAVE OTHER FAMILY MEMBERS IN HER HOME.PATIENT IS AWARE OF OBSERVATION STATUS.CSW NOTIFIED.

## 2012-07-28 NOTE — Progress Notes (Signed)
Clinical Social Work Department BRIEF PSYCHOSOCIAL ASSESSMENT 07/28/2012  Patient:  [name], [name]     Account Number:  [id number]     Admit date:  07/27/2012  Clinical Social Worker:  Dennison Bulla  Date/Time:  07/28/2012 12:30 PM  Referred by:  Physician  Date Referred:  07/28/2012 Referred for  Psychosocial assessment   Other Referral:   Interview type:  Patient Other interview type:    PSYCHOSOCIAL DATA Living Status:  FAMILY Admitted from facility:   Level of care:   Primary support name:  Athena Primary support relationship to patient:  FAMILY Degree of support available:   Adequate    CURRENT CONCERNS Current Concerns  Post-Acute Placement   Other Concerns:    SOCIAL WORK ASSESSMENT / PLAN CSW received referral to assist with DC planning. Per RN, patient reports she does not desire to return home to current living arrangements. CSW met with patient at bedside with no visitors present. CSW introduced myself and explained role.    Patient reports she is currently living with her niece and her family. Patient reports there are 10 people living there and she would prefer to go somewhere else. Patient interested in SNF vs ALF. CSW explained that insurance would not cover SNF due to no qualifying stay. Patient reports she does not have money to pay privately for SNF. CSW explained that patient could possibly qualify for ALF but would need LT Medicaid and would have to supplement cost with monthly income. Patient agreeable and desires [facility] search. CSW explained that if ALF placement could not set up immediately then patient might have to DC from the hospital and follow up with ALF for placement. Patient understanding and agreeable.    CSW completed FL2 and faxed out. CSW submitted for FL2. CSW asked RN to get order for TB test. CSW provided patient with ALF list. CSW will continue to follow.   Assessment/plan status:  Psychosocial Support/Ongoing Assessment of  Needs Other assessment/ plan:   Information/referral to community resources:   ALF information    PATIENT'S/FAMILY'S RESPONSE TO PLAN OF CARE: Patient alert and oriented. Patient thanked CSW for time and agreeable to ALF placement.       Coverage for [facility]

## 2012-07-28 NOTE — Progress Notes (Signed)
[facility] PROGRESS NOTE  [name] ZOX:[id number] DOB: 04/06/1950 DOA: 07/27/2012 PCP: Quentin Mulling, MD  Assessment/Plan: Principal Problem:   Chest wall pain Active Problems:   Hypertension   Depression   Tobacco use    1. Chest wall pain: Patient is reportedly s/p negative stress test in 02/2012, and has localized chest wall tenderness, without SOB, although there appears to be a mild pleuritic component. Cardiac enzymes are unelevated, 12-lead EKG is devoid of ischemic changes and CXR is devoid of acute findings. She has no history of trauma. Managing with analgesics. Will check D-Dimer.  2. Hypertension: BP is only mildly elevated. Patient was not on anti-hypertensives pre-admission. We shall commence Norvasc.  3. Tobacco use: Patient smoked about 2-3 cigarettes per day, up till 4 days ago. Counseled. 5. Depression: Stable.  6. Acute Gastroenteritis: Patient feels nauseated at this time. According to her, for the past 5 days, she has had severe nausea, vomited at least 5 times, and had profuse diarrhea about 3 x per day. Last episode was on 5/225/14. She still has mild abdominal discomfort. Will manage with PPi and antiemetics. Patient appears to have had a sel-limited acute gastroenteritis, which is now practically resolved.  7. Homelessness: Patient finds herself suddenly homeless and has no place to go. CSW consulted.    Code Status: Full Code.  Family Communication:  Disposition Plan: to be determined.    Brief narrative: 62 y.o. female With hx of HTN, depression, s/p cholecystectomy, returned to [facility] ER with persistent left-sided chest pain. She was seen at [facility] ER and had cardiology consulted, stress test was done, and she was felt to have chest wall pain as the etiology. There is palpable tenderness over the left ribs. She has no shortness of breath, nausea, vomiting. She is however, homeless, as a friend she has been living with no longer allow her to live there.  She was dropped off at the emergency room, and her friend can't be contacted. During the memorial weekend, there is no social service available and it was felt that she cannot be safely discharged. The EDP asked hospitalist to admit her under OBS for proper disposition. Shelters were called and none were available.    Consultants:  N/A  Procedures:  CXR.   Antibiotics:  N/A.   HPI/Subjective: Has nausea, abdominal discomfort and left infra-mammary chest pain.   Objective: Vital signs in last 24 hours: Temp:  [97.4 F (36.3 C)-98.9 F (37.2 C)] 97.4 F (36.3 C) (05/27 0538) Pulse Rate:  [60-72] 60 (05/27 0538) Resp:  [16-18] 18 (05/27 0538) BP: (145-152)/(79-86) 145/83 mmHg (05/27 0538) SpO2:  [95 %-100 %] 100 % (05/27 0538) Weight:  [47.809 kg (105 lb 6.4 oz)] 47.809 kg (105 lb 6.4 oz) (05/27 0015) Weight change:  Last BM Date: 07/27/12  Intake/Output from previous day: 05/26 0701 - 05/27 0700 In: -  Out: 2 [Urine:2]     Physical Exam: General: Comfortable, alert, communicative, fully oriented, not short of breath at rest.  HEENT:  No clinical pallor, no jaundice, no conjunctival injection or discharge. Hydration is fair.  NECK:  Supple, JVP not seen, no carotid bruits, no palpable lymphadenopathy, no palpable goiter. CHEST:  Clinically clear to auscultation, no wheezes, no crackles. Has tenderness to palpation, left lower rib cage.  HEART:  Sounds 1 and 2 heard, normal, regular, no murmurs. ABDOMEN:  Full, soft, non-tender, mild upper abdominal discomfort, no palpable organomegaly, no palpable masses, normal bowel sounds. GENITALIA:  Not examined. LOWER EXTREMITIES:  No pitting edema, palpable peripheral pulses. MUSCULOSKELETAL SYSTEM:  Generalized osteoarthritic changes, otherwise, normal. CENTRAL NERVOUS SYSTEM:  No focal neurologic deficit on gross examination.  Lab Results:  Recent Labs  07/26/12 2214 07/28/12 0040  WBC 4.6 4.8  HGB 13.3 13.2  HCT 37.8  39.2  PLT 207 216    Recent Labs  07/26/12 2214 07/28/12 0040  NA 139  --   K 3.4*  --   CL 102  --   CO2 27  --   GLUCOSE 104*  --   BUN 12  --   CREATININE 0.82 0.96  CALCIUM 9.7  --    No results found for this or any previous visit (from the past 240 hour(s)).   Studies/Results: Dg Chest 2 View  07/26/2012   *RADIOLOGY REPORT*  Clinical Data: Chest pain and hypertension  CHEST - 2 VIEW  Comparison: Thoracic spine films 04/27 1014 the  Findings: Normal mediastinum and heart silhouette.  Posterior thoracic fusion noted.  No effusion, infiltrate, or pneumothorax.  IMPRESSION: No acute cardiopulmonary process.   Original Report Authenticated By: Genevive Bi, M.D.    Medications: Scheduled Meds: . aspirin EC  81 mg Oral Daily  . enoxaparin (LOVENOX) injection  40 mg Subcutaneous Q24H  . sodium chloride  3 mL Intravenous Q12H  . sodium chloride  3 mL Intravenous Q12H   Continuous Infusions:  PRN Meds:.sodium chloride, ketorolac, ondansetron (ZOFRAN) IV, ondansetron, sodium chloride    LOS: 1 day   Addalie Calles,CHRISTOPHER  [facility] Pager [phone]. If 8PM-8AM, please contact night-coverage at [email or web address], password [facility] 07/28/2012, 9:38 AM  LOS: 1 day

## 2012-07-28 NOTE — Progress Notes (Signed)
Clinical Social Work Department CLINICAL SOCIAL WORK PLACEMENT NOTE 07/28/2012  Patient:  [name], [name]  Account Number:  [id number] Admit date:  07/27/2012  Clinical Social Worker:  Unk Lightning, LCSW  Date/time:  07/28/2012 02:00 PM  Clinical Social Work is seeking post-discharge placement for this patient at the following level of care:   ASSISTED LIVING/REST HOME   (*CSW will update this form in Epic as items are completed)   07/28/2012  Patient/family provided with [facility] Department of Clinical Social Work's list of facilities offering this level of care within the geographic area requested by the patient (or if unable, by the patient's family).  07/28/2012  Patient/family informed of their freedom to choose among providers that offer the needed level of care, that participate in Medicare, Medicaid or managed care program needed by the patient, have an available bed and are willing to accept the patient.  07/28/2012  Patient/family informed of [facility]' ownership interest in [facility], as well as of the fact that they are under no obligation to receive care at this facility.  PASARR submitted to EDS on 07/28/2012 PASARR number received from EDS on 07/28/2012  FL2 transmitted to all facilities in geographic area requested by pt/family on  07/28/2012 FL2 transmitted to all facilities within larger geographic area on   Patient informed that his/her managed care company has contracts with or will negotiate with  certain facilities, including the following:     Patient/family informed of bed offers received:   Patient chooses bed at  Physician recommends and patient chooses bed at    Patient to be transferred to  on   Patient to be transferred to facility by   The following physician request were entered in Epic:   Additional Comments:

## 2012-07-29 LAB — CBC
Hemoglobin: 11.4 g/dL — ABNORMAL LOW (ref 12.0–15.0)
MCH: 31 pg (ref 26.0–34.0)
MCHC: 33.4 g/dL (ref 30.0–36.0)
MCV: 92.7 fL (ref 78.0–100.0)
Platelets: 185 10*3/uL (ref 150–400)
RBC: 3.68 MIL/uL — ABNORMAL LOW (ref 3.87–5.11)

## 2012-07-29 LAB — BASIC METABOLIC PANEL
CO2: 30 mEq/L (ref 19–32)
Calcium: 9.5 mg/dL (ref 8.4–10.5)
Creatinine, Ser: 1.1 mg/dL (ref 0.50–1.10)
GFR calc non Af Amer: 53 mL/min — ABNORMAL LOW (ref >90)
Glucose, Bld: 109 mg/dL — ABNORMAL HIGH (ref 70–99)

## 2012-07-29 MED ORDER — PANTOPRAZOLE SODIUM 40 MG PO TBEC: 40.0000 mg | DELAYED_RELEASE_TABLET | Freq: Every day | ORAL | Status: DC

## 2012-07-29 MED ORDER — AMLODIPINE BESYLATE 5 MG PO TABS: 5.0000 mg | ORAL_TABLET | Freq: Every day | ORAL | Status: DC

## 2012-07-29 NOTE — Progress Notes (Signed)
Patient is cleared for discharge, continues to be observation status with no bed offers. CSW met with patient. Patient was very upset about being discharged and saying she has no place to go. CSW discussed home health and placememtn from home. She insists she has no place to go. CSW offered homeless shelter options. Patient states that she doesn't want to go to the homeless shelter because they make you leave at six am every day and shed [place] woods. CSW provided patient with bus pass. No further CSW needs noted.  Tania Perrott C. Mialani Reicks MSW, LCSW [phone]

## 2012-07-29 NOTE — Discharge Summary (Signed)
Physician Discharge Summary  [name] ZOX:[id number] DOB: [birth year] DOA: 07/27/2012  PCP: Quentin Mulling, MD  Admit date: 07/27/2012 Discharge date: 07/29/2012  Time spent: 35 minutes  Recommendations for Outpatient Follow-up:  1.   Discharge Diagnoses:  Principal Problem:   Chest wall pain Active Problems:   Hypertension   Depression   Tobacco use   Discharge Condition: improved  Diet recommendation: cardiac  Filed Weights   07/28/12 0015  Weight: 47.809 kg (105 lb 6.4 oz)    History of present illness:  [name] is an 62 y.o. female With hx of HTN, depression, s/p CCY, returned to [facility] ER with persistent leftsided chest pain. She was seen at [facility] ER and had cardiology consulted, stress test, and was felt to be chest wall pain as the etiology. It is palpable tenderness over the left ribs. She has no shortness of breath, nausea, vomiting. She is however, homeless, as a friend she has been living with no longer allow her to live there. She was dropped off at the emergency room, and her friend can't be contacted. During the memorial weekend, there is no social service available and it was felt that she cannot be safely discharged. The EDP asked hospitalist to admit her under OBS for proper disposition. Shelters were called and none were available   Hospital Course:  Chest wall pain- CE neg; EKG/tele ok- recent stress test, d dimer negative Social issues- Child psychotherapist spoke with patient- not interested in homeless shelter Tobacco abuse HTN- added norvasc  Procedures:  none  Consultations:  none  Discharge Exam: Filed Vitals:   07/28/12 0538 07/28/12 1440 07/28/12 2144 07/29/12 0529  BP: 145/83 131/73 109/73 115/67  Pulse: 60 72 79 72  Temp: 97.4 F (36.3 C) 98.1 F (36.7 C) 98.4 F (36.9 C) 98.1 F (36.7 C)  TempSrc: Oral Oral Oral Oral  Resp: 18 19 18 18   Height:      Weight:      SpO2: 100% 97% 97% 98%    General: tearful- no  SI/HI Cardiovascular: rrr Respiratory: clear anterior  Discharge Instructions      Discharge Orders   Future Orders Complete By Expires     Diet - low sodium heart healthy  As directed     Increase activity slowly  As directed         Medication List    TAKE these medications       acetaminophen 500 MG tablet  Commonly known as:  TYLENOL  Take 1,000 mg by mouth every 6 (six) hours as needed for pain.     amLODipine 5 MG tablet  Commonly known as:  NORVASC  Take 1 tablet (5 mg total) by mouth daily.     diclofenac sodium 1 % Gel  Commonly known as:  VOLTAREN  Apply 2 g topically 4 (four) times daily. FOR BACK PAIN     pantoprazole 40 MG tablet  Commonly known as:  PROTONIX  Take 1 tablet (40 mg total) by mouth daily at 6 (six) AM.     promethazine 25 MG suppository  Commonly known as:  PHENERGAN  Place 1 suppository (25 mg total) rectally every 6 (six) hours as needed for nausea.       Allergies  Allergen Reactions  . Compazine Rash    Gets spots on skin and has to get it flushed out of her system  . Influenza Vaccines Other (See Comments)    Got pneumonia, will not take vaccine  .  Metoclopramide Hcl Hives  . Zofran Hives   Follow-up Information   Follow up with HOOPER,JEFFREY C, MD In 1 week.   Contact information:   [address] [place] [address] [phone]        The results of significant diagnostics from this hospitalization (including imaging, microbiology, ancillary and laboratory) are listed below for reference.    Significant Diagnostic Studies: Dg Chest 2 View  07/26/2012   *RADIOLOGY REPORT*  Clinical Data: Chest pain and hypertension  CHEST - 2 VIEW  Comparison: Thoracic spine films 04/27 1014 the  Findings: Normal mediastinum and heart silhouette.  Posterior thoracic fusion noted.  No effusion, infiltrate, or pneumothorax.  IMPRESSION: No acute cardiopulmonary process.   Original Report Authenticated By: Genevive Bi, M.D.    Ct Head Wo Contrast  07/17/2012   *RADIOLOGY REPORT*  Clinical Data: Sudden onset of headache and confusion, weakness, nausea and vomiting  CT HEAD WITHOUT CONTRAST  Technique:  Contiguous axial images were obtained from the base of the skull through the vertex without contrast.  Comparison: 11/01/2011; 06/18/2011; 03/11/2011  Findings:  Examination is minimally degraded secondary to obliquity.  Gray white differentiation is maintained.  No CT evidence of acute large territory infarct.  No intraparenchymal or extra-axial mass or hemorrhage.  Normal size and configuration of the ventricles and basilar cisterns.  No midline shift.  Limited visualization of the paranasal sinuses and mastoid air cells are normal.  Regional soft tissues are normal.  No displaced calvarial fracture.  Post bilateral cataract surgery.  IMPRESSION: Negative noncontrast head CT.   Original Report Authenticated By: Tacey Ruiz, MD    Microbiology: No results found for this or any previous visit (from the past 240 hour(s)).   Labs: Basic Metabolic Panel:  Recent Labs Lab 07/26/12 2214 07/28/12 0040 07/29/12 0450  NA 139  --  142  K 3.4*  --  3.7  CL 102  --  104  CO2 27  --  30  GLUCOSE 104*  --  109*  BUN 12  --  25*  CREATININE 0.82 0.96 1.10  CALCIUM 9.7  --  9.5   Liver Function Tests: No results found for this basename: AST, ALT, ALKPHOS, BILITOT, PROT, ALBUMIN,  in the last 168 hours No results found for this basename: LIPASE, AMYLASE,  in the last 168 hours No results found for this basename: AMMONIA,  in the last 168 hours CBC:  Recent Labs Lab 07/26/12 2214 07/28/12 0040 07/29/12 0450  WBC 4.6 4.8 4.8  HGB 13.3 13.2 11.4*  HCT 37.8 39.2 34.1*  MCV 89.6 90.5 92.7  PLT 207 216 185   Cardiac Enzymes:  Recent Labs Lab 07/28/12 0040 07/28/12 0640  TROPONINI <0.30 <0.30   BNP: BNP (last 3 results) No results found for this basename: PROBNP,  in the last 8760 hours CBG: No results  found for this basename: GLUCAP,  in the last 168 hours     Signed:  Marlin Canary  [facility] 07/29/2012, 2:39 PM

## 2012-07-30 ENCOUNTER — Encounter (HOSPITAL_COMMUNITY): Payer: Self-pay

## 2012-07-30 ENCOUNTER — Emergency Department (HOSPITAL_COMMUNITY)
Admission: EM | Admit: 2012-07-30 | Discharge: 2012-07-30 | Disposition: A | Discharge status: Home / Self Care | Payer: Medicare Other | Source: Home / Self Care | Attending: Emergency Medicine | Admitting: Emergency Medicine

## 2012-07-30 ENCOUNTER — Encounter (HOSPITAL_COMMUNITY): Payer: Self-pay | Admitting: *Deleted

## 2012-07-30 ENCOUNTER — Ambulatory Visit (HOSPITAL_COMMUNITY)
Admission: RE | Admit: 2012-07-30 | Discharge: 2012-07-30 | Disposition: A | Discharge status: Home / Self Care | Payer: Medicare Other | Attending: Psychiatry | Admitting: Psychiatry

## 2012-07-30 ENCOUNTER — Emergency Department (HOSPITAL_COMMUNITY): Payer: Medicare Other

## 2012-07-30 ENCOUNTER — Encounter (HOSPITAL_COMMUNITY): Payer: Self-pay | Admitting: Emergency Medicine

## 2012-07-30 DIAGNOSIS — Z79899 Other long term (current) drug therapy: Secondary | ICD-10-CM | POA: Insufficient documentation

## 2012-07-30 DIAGNOSIS — I10 Essential (primary) hypertension: Secondary | ICD-10-CM | POA: Insufficient documentation

## 2012-07-30 DIAGNOSIS — F39 Unspecified mood [affective] disorder: Secondary | ICD-10-CM | POA: Insufficient documentation

## 2012-07-30 DIAGNOSIS — Z791 Long term (current) use of non-steroidal anti-inflammatories (NSAID): Secondary | ICD-10-CM | POA: Insufficient documentation

## 2012-07-30 DIAGNOSIS — Z87448 Personal history of other diseases of urinary system: Secondary | ICD-10-CM | POA: Insufficient documentation

## 2012-07-30 DIAGNOSIS — F411 Generalized anxiety disorder: Secondary | ICD-10-CM | POA: Insufficient documentation

## 2012-07-30 DIAGNOSIS — Z87891 Personal history of nicotine dependence: Secondary | ICD-10-CM | POA: Insufficient documentation

## 2012-07-30 DIAGNOSIS — Z8659 Personal history of other mental and behavioral disorders: Secondary | ICD-10-CM | POA: Insufficient documentation

## 2012-07-30 DIAGNOSIS — R110 Nausea: Secondary | ICD-10-CM | POA: Insufficient documentation

## 2012-07-30 DIAGNOSIS — F3289 Other specified depressive episodes: Secondary | ICD-10-CM | POA: Insufficient documentation

## 2012-07-30 DIAGNOSIS — R450 Nervousness: Secondary | ICD-10-CM | POA: Insufficient documentation

## 2012-07-30 DIAGNOSIS — R51 Headache: Secondary | ICD-10-CM | POA: Insufficient documentation

## 2012-07-30 DIAGNOSIS — R079 Chest pain, unspecified: Secondary | ICD-10-CM | POA: Insufficient documentation

## 2012-07-30 DIAGNOSIS — R5381 Other malaise: Secondary | ICD-10-CM | POA: Insufficient documentation

## 2012-07-30 DIAGNOSIS — F329 Major depressive disorder, single episode, unspecified: Secondary | ICD-10-CM | POA: Insufficient documentation

## 2012-07-30 DIAGNOSIS — F17200 Nicotine dependence, unspecified, uncomplicated: Secondary | ICD-10-CM | POA: Insufficient documentation

## 2012-07-30 DIAGNOSIS — R071 Chest pain on breathing: Secondary | ICD-10-CM | POA: Insufficient documentation

## 2012-07-30 DIAGNOSIS — R0789 Other chest pain: Secondary | ICD-10-CM

## 2012-07-30 DIAGNOSIS — Z87442 Personal history of urinary calculi: Secondary | ICD-10-CM | POA: Insufficient documentation

## 2012-07-30 LAB — CBC
MCH: 31.3 pg (ref 26.0–34.0)
MCV: 92.2 fL (ref 78.0–100.0)
Platelets: 164 10*3/uL (ref 150–400)
RDW: 13 % (ref 11.5–15.5)

## 2012-07-30 LAB — COMPREHENSIVE METABOLIC PANEL
AST: 33 U/L (ref 0–37)
Albumin: 3.7 g/dL (ref 3.5–5.2)
Calcium: 9.9 mg/dL (ref 8.4–10.5)
Creatinine, Ser: 0.84 mg/dL (ref 0.50–1.10)
GFR calc non Af Amer: 74 mL/min — ABNORMAL LOW (ref >90)
Total Protein: 6.8 g/dL (ref 6.0–8.3)

## 2012-07-30 LAB — POCT I-STAT TROPONIN I

## 2012-07-30 LAB — TROPONIN I: Troponin I: <0.3 ng/mL (ref <0.30)

## 2012-07-30 MED ORDER — HYDROCODONE-ACETAMINOPHEN 5-325 MG PO TABS
2.0000 | ORAL_TABLET | Freq: Once | ORAL | Status: AC
  Administered 2012-07-30: 2 via ORAL
  Filled 2012-07-30: qty 2

## 2012-07-30 NOTE — BH Assessment (Signed)
BHH Assessment Progress Note      Spoke with Fenton Foy, Charge Nurse at [facility] and Bradly Chris, RN to inform her that pt left [facility] and may present their with similar symptoms and presentation, as patient was assessed at [facility].     Glorious Peach, MS, LCASA Assessment Counselor

## 2012-07-30 NOTE — BH Assessment (Signed)
Assessment Note   [name] is an 62 y.o. female. Pt presents with chief C/O Anxiety,Depression, and having nowhere to live. Pt reports that she came to [facility] because she wanted to stay here for "a night or two" because she does not want to go back to her friends home where they drink alcohol and get drunk and throw up, pt states she does not want to clean up after them. Pt can return to that arrangement but states "i dont want to go back there because he wants to charge me more money". Pt reports that she stayed at the [employer] last night and is unsure if she can return there today. Pt reports feeling anxious and depressed about her stressors. Pt reports a limited income as she reports receiving Disability benefits and Food stamps. Pt denies SI,HI, and no AVH reported. Consulted with AC Thurman Coyer who recommends that pt return to shelter as pt does not meet inpatient treatment criteria. AC Eric provided this counselor with a bus pass to give to patient. This Clinical research associate gave bus pass to pt and explained the MSE. Pt refused to wait for MSE or sign MSE decline form and left the building before resources(shelter,crisis,mental health Outpatient services) could be provided. Informed AC of this.  Axis I: Mood Disorder NOS Axis II: Deferred Axis III:  Past Medical History  Diagnosis Date  . Hypertension   . Depression   . Renal disorder kidney stones   Axis IV: economic problems, housing problems and other psychosocial or environmental problems Axis V: 51-60 moderate symptoms  Past Medical History:  Past Medical History  Diagnosis Date  . Hypertension   . Depression   . Renal disorder kidney stones    Past Surgical History  Procedure Laterality Date  . Abdominal hysterectomy    . Cholecystectomy    . Kidney stones    . Back surgery      Family History:  Family History  Problem Relation Age of Onset  . Alzheimer's disease Mother   . Heart failure Mother   . Stroke Father    . Coronary artery disease Sister   . Coronary artery disease Sister   . Coronary artery disease Sister     Social History:  reports that she has quit smoking. Her smoking use included Cigarettes. She smoked 0.25 packs per day. She has never used smokeless tobacco. She reports that she does not drink alcohol or use illicit drugs.  Additional Social History:  Alcohol / Drug Use History of alcohol / drug use?: No history of alcohol / drug abuse  CIWA:   COWS:    Allergies:  Allergies  Allergen Reactions  . Compazine Rash    Gets spots on skin and has to get it flushed out of her system  . Aspirin   . Dilaudid (Hydromorphone Hcl)   . Influenza Vaccines Other (See Comments)    Got pneumonia, will not take vaccine  . Metoclopramide Hcl Hives  . Zofran Hives    Home Medications:  (Not in a hospital admission)  OB/GYN Status:  No LMP recorded. Patient has had a hysterectomy.  General Assessment Data Location of Assessment: [facility] Assessment Services Living Arrangements: Non-relatives/Friends (Staying with a female friend ) Can pt return to current living arrangement?: Yes Admission Status: Voluntary Is patient capable of signing voluntary admission?: Yes Transfer from: Unknown Referral Source: Self/Family/Friend     Risk to self Suicidal Ideation: No Suicidal Intent: No Is patient at risk for suicide?:  No Suicidal Plan?: No Access to Means: No What has been your use of drugs/alcohol within the last 12 months?: none reported Previous Attempts/Gestures: No (pt denies) How many times?: 0 Other Self Harm Risks: none reported Triggers for Past Attempts: None known Intentional Self Injurious Behavior: None Family Suicide History: No (brother o/d on pills ) Recent stressful life event(s): Financial Problems;Other (Comment) (homeless,no supports per pt's report) Persecutory voices/beliefs?: No Depression: Yes Depression Symptoms: Tearfulness;Isolating;Fatigue;Feeling  worthless/self pity Substance abuse history and/or treatment for substance abuse?: No Suicide prevention information given to non-admitted patients: Not applicable  Risk to Others Homicidal Ideation: No Thoughts of Harm to Others: No Current Homicidal Intent: No Current Homicidal Plan: No Access to Homicidal Means: No Identified Victim: na History of harm to others?: No Assessment of Violence: None Noted Violent Behavior Description: None Noted Does patient have access to weapons?: No Criminal Charges Pending?: No Does patient have a court date: No  Psychosis Hallucinations: None noted Delusions: None noted  Mental Status Report Appear/Hygiene: Other (Comment) (Appropriate) Eye Contact: Fair Motor Activity: Freedom of movement Speech: Logical/coherent Level of Consciousness: Alert Mood: Depressed;Anxious Affect: Anxious;Appropriate to circumstance;Depressed Anxiety Level: Minimal Thought Processes: Coherent;Relevant Judgement: Unimpaired Orientation: Person;Place;Time;Situation Obsessive Compulsive Thoughts/Behaviors: None  Cognitive Functioning Concentration: Normal Memory: Recent Intact;Remote Intact IQ: Average Insight: Fair Impulse Control: Fair Appetite: Fair Weight Loss:  (pt is unsure) Weight Gain: 0 Sleep: No Change Total Hours of Sleep:  (pt reports sleeping 6.5-7 hrs per day) Vegetative Symptoms: None  ADLScreening [facility] Assessment Services) Patient's cognitive ability adequate to safely complete daily activities?: Yes Patient able to express need for assistance with ADLs?: Yes Independently performs ADLs?: Yes (appropriate for developmental age)  Abuse/Neglect [facility]) Physical Abuse: Denies Verbal Abuse: Denies Sexual Abuse: Denies  Prior Inpatient Therapy Prior Inpatient Therapy: Yes Prior Therapy Dates: Years ago "on the 5th or 6th floor at [facility]" Prior Therapy Facilty/Provider(s):  [facility]) Reason for Treatment:  Depression  Prior Outpatient Therapy Prior Outpatient Therapy: No Prior Therapy Dates: na Prior Therapy Facilty/Provider(s): na Reason for Treatment: na  ADL Screening (condition at time of admission) Patient's cognitive ability adequate to safely complete daily activities?: Yes Patient able to express need for assistance with ADLs?: Yes Independently performs ADLs?: Yes (appropriate for developmental age) Weakness of Legs: None Weakness of Arms/Hands: None       Abuse/Neglect Assessment (Assessment to be complete while patient is alone) Physical Abuse: Denies Verbal Abuse: Denies Sexual Abuse: Denies Exploitation of patient/patient's resources: Denies Self-Neglect: Denies     Merchant navy officer (For Healthcare) Advance Directive: Patient does not have advance directive Nutrition Screen- [facility] Adult/WL/AP Have you recently lost weight without trying?: No Have you been eating poorly because of a decreased appetite?: No Malnutrition Screening Tool Score: 0  Additional Information 1:1 In Past 12 Months?: No CIRT Risk: No Elopement Risk: No Does patient have medical clearance?: No     Disposition:  Disposition Initial Assessment Completed for this Encounter: Yes Disposition of Patient: Outpatient treatment Type of outpatient treatment: Adult  On Site Evaluation by:   Reviewed with Physician:    Glorious Peach, MS, LCASA Assessment Counselor  Glorious Peach Sabreen 07/30/2012 7:17 PM

## 2012-07-30 NOTE — ED Notes (Signed)
Pt c/o L sided chest pain since 1500. Pt states she was "just sitting there" and chest pain came on. Pt states pain radiates to L shoulder. States pain is constant. States when she takes a deep breath, it feels like her chest is heavy. Pt also c/o slight nausea and shortness of breath. Pt states her car broke down and she walked here. Pt reports that she stopped smoking 2 weeks ago. Pt a/o x 4. Pt with no acute distress. Skin warm and dry.

## 2012-07-30 NOTE — ED Provider Notes (Signed)
Medical screening examination/treatment/procedure(s) were performed by non-physician practitioner and as supervising physician I was immediately available for consultation/collaboration.  Hurman Horn, MD 07/30/12 [phone]

## 2012-07-30 NOTE — ED Provider Notes (Signed)
History     CSN: [id number]  Arrival date & time 07/30/12  1825   First MD Initiated Contact with Patient 07/30/12 1940      Chief Complaint  Patient presents with  . Chest Pain    (Consider location/radiation/quality/duration/timing/severity/associated sxs/prior treatment) Patient is a 62 y.o. female presenting with chest pain. The history is provided by the patient.  Chest Pain Associated symptoms: no abdominal pain, no back pain, no cough, no fever, no headache, no palpitations and no shortness of breath   pt c/o cp for past day. Constant. Midchest. Worse w palpation. Not pleuritic. No associated sob, nv or diaphoresis. At rest. No relation to activity or exertion. No leg pain or swelling. Denies gerd/heartburn. No chest wall injury or strain. No cough or hemoptysis, no uri. Denies hx cad or family hx premature cad. Pt denies recent surgery, immobility, trauma, travel, or hx dvt or pe.   Past Medical History  Diagnosis Date  . Hypertension   . Depression   . Renal disorder kidney stones    Past Surgical History  Procedure Laterality Date  . Abdominal hysterectomy    . Cholecystectomy    . Kidney stones    . Back surgery      Family History  Problem Relation Age of Onset  . Alzheimer's disease Mother   . Heart failure Mother   . Stroke Father   . Coronary artery disease Sister   . Coronary artery disease Sister   . Coronary artery disease Sister     History  Substance Use Topics  . Smoking status: Former Smoker -- 0.25 packs/day    Types: Cigarettes  . Smokeless tobacco: Never Used  . Alcohol Use: No    OB History   Grav Para Term Preterm Abortions TAB SAB Ect Mult Living                  Review of Systems  Constitutional: Negative for fever and chills.  HENT: Negative for neck pain.   Eyes: Negative for redness.  Respiratory: Negative for cough and shortness of breath.   Cardiovascular: Positive for chest pain. Negative for palpitations and leg  swelling.  Gastrointestinal: Negative for abdominal pain.  Genitourinary: Negative for flank pain.  Musculoskeletal: Negative for back pain.  Skin: Negative for rash.  Neurological: Negative for headaches.  Hematological: Does not bruise/bleed easily.  Psychiatric/Behavioral: Negative for confusion.    Allergies  Compazine; Aspirin; Dilaudid; Influenza vaccines; Metoclopramide hcl; and Zofran  Home Medications   Current Outpatient Rx  Name  Route  Sig  Dispense  Refill  . acetaminophen (TYLENOL) 500 MG tablet   Oral   Take 1,000 mg by mouth every 6 (six) hours as needed for pain.         Marland Kitchen amLODipine (NORVASC) 5 MG tablet   Oral   Take 5 mg by mouth daily.         . diclofenac sodium (VOLTAREN) 1 % GEL   Topical   Apply 2 g topically 4 (four) times daily. FOR BACK PAIN         . pantoprazole (PROTONIX) 40 MG tablet   Oral   Take 40 mg by mouth daily.           BP 130/80  Pulse 84  Resp 16  SpO2 100%  Physical Exam  Nursing note and vitals reviewed. Constitutional: She is oriented to person, place, and time. She appears well-developed and well-nourished. No distress.  HENT:  Mouth/Throat: Oropharynx  is clear and moist.  Eyes: Conjunctivae are normal. No scleral icterus.  Neck: Neck supple. No tracheal deviation present.  Cardiovascular: Normal rate, regular rhythm, normal heart sounds and intact distal pulses.   Pulmonary/Chest: Effort normal and breath sounds normal. No respiratory distress. She exhibits tenderness.  Abdominal: Soft. Normal appearance and bowel sounds are normal. She exhibits no distension. There is no tenderness.  Musculoskeletal: She exhibits no edema and no tenderness.  Neurological: She is alert and oriented to person, place, and time.  Skin: Skin is warm and dry. No rash noted.  Psychiatric: She has a normal mood and affect.    ED Course  Procedures (including critical care time)  Results for orders placed during the hospital  encounter of 07/30/12  TROPONIN I      Result Value Range   Troponin I <0.30  <0.30 ng/mL   Dg Chest 2 View  07/30/2012   *RADIOLOGY REPORT*  Clinical Data: Left-sided chest pain, nausea, history of hypertension and COPD, smoker  CHEST - 2 VIEW  Comparison: 07/26/2012; 06/24/2012  Findings: Grossly unchanged cardiac silhouette and mediastinal contours.  The lungs remain hyperexpanded with flattening of bilateral hemidiaphragms mild diffuse thickening of the pulmonary tissue.  No focal airspace opacity.  No pleural effusion or pneumothorax.  Unchanged bones including sequela of long segment paraspinal fusion within the superior aspect of the thoracic spine, incompletely evaluated.  IMPRESSION: Hyperexpanded lungs are acute cardiopulmonary disease.   Original Report Authenticated By: Tacey Ruiz, MD   Dg Chest 2 View  07/26/2012   *RADIOLOGY REPORT*  Clinical Data: Chest pain and hypertension  CHEST - 2 VIEW  Comparison: Thoracic spine films 04/27 1014 the  Findings: Normal mediastinum and heart silhouette.  Posterior thoracic fusion noted.  No effusion, infiltrate, or pneumothorax.  IMPRESSION: No acute cardiopulmonary process.   Original Report Authenticated By: Genevive Bi, M.D.   Ct Head Wo Contrast  07/17/2012   *RADIOLOGY REPORT*  Clinical Data: Sudden onset of headache and confusion, weakness, nausea and vomiting  CT HEAD WITHOUT CONTRAST  Technique:  Contiguous axial images were obtained from the base of the skull through the vertex without contrast.  Comparison: 11/01/2011; 06/18/2011; 03/11/2011  Findings:  Examination is minimally degraded secondary to obliquity.  Gray white differentiation is maintained.  No CT evidence of acute large territory infarct.  No intraparenchymal or extra-axial mass or hemorrhage.  Normal size and configuration of the ventricles and basilar cisterns.  No midline shift.  Limited visualization of the paranasal sinuses and mastoid air cells are normal.  Regional  soft tissues are normal.  No displaced calvarial fracture.  Post bilateral cataract surgery.  IMPRESSION: Negative noncontrast head CT.   Original Report Authenticated By: Tacey Ruiz, MD       MDM  Lab.  Reviewed nursing notes and prior charts for additional history.    Pt had trop and other labs earlier today, neg acute. Also had cxr neg.  Pt w recent inpt stay, cardiology eval, felt to have non cardiac cp.  Pt referred to shelter, many recent prior sw/case management evals for same issues.   Pt with constant symptoms x >24 hrs after which troponin negative/normal.   Recheck pt resting comfortably. No distress.         Suzi Roots, MD 07/30/12 2238

## 2012-07-30 NOTE — ED Notes (Signed)
Pt given several resources by social work, when left stated she did not want to go to any of the places we referred her to. Given bus passes. Left upset, stating "I'll just sleep on the street". Encouraged pt to call niece, but states "she already has 10 people staying with her". Encouraged her to call and maybe just stay for a night or two until she can find somewhere more stable.

## 2012-07-30 NOTE — ED Notes (Signed)
Attempted lab draw x2 unsuccessful sticks. Continuing to assess.

## 2012-07-30 NOTE — ED Notes (Addendum)
Pt arrived [facility] from homeless shelter. EMS reports pt having CP today. Pt tearful with EMS. Pt reports nausea.  324 ASA given by EMS PTA.

## 2012-07-30 NOTE — ED Provider Notes (Signed)
History     CSN: [id number]  Arrival date & time 07/30/12  1248   First MD Initiated Contact with Patient 07/30/12 1331      Chief Complaint  Patient presents with  . Chest Pain    (Consider location/radiation/quality/duration/timing/severity/associated sxs/prior treatment) Patient is a 62 y.o. female presenting with chest pain. The history is provided by the patient. No language interpreter was used.  Chest Pain Pain location:  L chest Pain quality: aching   Pain radiates to:  Does not radiate Pain radiates to the back: no   Pain severity:  Moderate Timing:  Constant Progression:  Worsening Context: at rest   Context: not breathing and not eating   Relieved by:  Nothing Worsened by:  Nothing tried Ineffective treatments:  None tried Pt recently admitted 5/26.   Social worker attempted to find placement for pt.   Pt here today because she has chest pain and no where to go.   Pt does not know if she was given follow up instructions resources  Past Medical History  Diagnosis Date  . Hypertension   . Depression   . Renal disorder kidney stones    Past Surgical History  Procedure Laterality Date  . Abdominal hysterectomy    . Cholecystectomy    . Kidney stones    . Back surgery      Family History  Problem Relation Age of Onset  . Alzheimer's disease Mother   . Heart failure Mother   . Stroke Father   . Coronary artery disease Sister   . Coronary artery disease Sister   . Coronary artery disease Sister     History  Substance Use Topics  . Smoking status: Current Some Day Smoker -- 0.25 packs/day    Types: Cigarettes  . Smokeless tobacco: Never Used  . Alcohol Use: No    OB History   Grav Para Term Preterm Abortions TAB SAB Ect Mult Living                  Review of Systems  Cardiovascular: Positive for chest pain.  All other systems reviewed and are negative.    Allergies  Compazine; Influenza vaccines; Metoclopramide hcl; and Zofran  Home  Medications   Current Outpatient Rx  Name  Route  Sig  Dispense  Refill  . acetaminophen (TYLENOL) 500 MG tablet   Oral   Take 1,000 mg by mouth every 6 (six) hours as needed for pain.         Marland Kitchen amLODipine (NORVASC) 5 MG tablet   Oral   Take 1 tablet (5 mg total) by mouth daily.   30 tablet   0   . diclofenac sodium (VOLTAREN) 1 % GEL   Topical   Apply 2 g topically 4 (four) times daily. FOR BACK PAIN         . pantoprazole (PROTONIX) 40 MG tablet   Oral   Take 1 tablet (40 mg total) by mouth daily at 6 (six) AM.   30 tablet   0   . promethazine (PHENERGAN) 25 MG suppository   Rectal   Place 1 suppository (25 mg total) rectally every 6 (six) hours as needed for nausea.   12 each   0     BP 118/77  Pulse 81  Temp(Src) 98.1 F (36.7 C) (Oral)  Resp 14  SpO2 99%  Physical Exam  Nursing note and vitals reviewed. Constitutional: She is oriented to person, place, and time. She appears well-developed and  well-nourished.  HENT:  Head: Normocephalic.  Right Ear: External ear normal.  Left Ear: External ear normal.  Eyes: Pupils are equal, round, and reactive to light.  Neck: Normal range of motion.  Cardiovascular: Normal rate.   Pulmonary/Chest: Effort normal. She exhibits tenderness.  Abdominal: Soft.  Musculoskeletal: Normal range of motion.  Neurological: She is alert and oriented to person, place, and time. She has normal reflexes.  Skin: Skin is warm.  Psychiatric: She has a normal mood and affect.    ED Course  Procedures (including critical care time)  Labs Reviewed  CBC  COMPREHENSIVE METABOLIC PANEL   No results found.   No diagnosis found.    MDM   Date: 07/30/2012  Rate:72  Rhythm: normal sinus rhythm  QRS Axis: normal  Intervals: normal  ST/T Wave abnormalities: normal  Conduction Disutrbances:none  Narrative Interpretation:   Old EKG Reviewed: unchanged         Elson Areas, PA-C 07/30/12 1359

## 2012-07-31 ENCOUNTER — Emergency Department (HOSPITAL_COMMUNITY)
Admission: EM | Admit: 2012-07-31 | Discharge: 2012-07-31 | Disposition: A | Discharge status: Home / Self Care | Payer: Medicare Other | Attending: Emergency Medicine | Admitting: Emergency Medicine

## 2012-07-31 ENCOUNTER — Encounter (HOSPITAL_COMMUNITY): Payer: Self-pay

## 2012-07-31 ENCOUNTER — Encounter (HOSPITAL_COMMUNITY): Payer: Self-pay | Admitting: *Deleted

## 2012-07-31 ENCOUNTER — Ambulatory Visit (HOSPITAL_COMMUNITY)
Admission: AD | Admit: 2012-07-31 | Discharge: 2012-07-31 | Disposition: A | Discharge status: Home / Self Care | Payer: Medicare Other | Attending: Psychiatry | Admitting: Psychiatry

## 2012-07-31 DIAGNOSIS — F329 Major depressive disorder, single episode, unspecified: Secondary | ICD-10-CM | POA: Insufficient documentation

## 2012-07-31 DIAGNOSIS — F3289 Other specified depressive episodes: Secondary | ICD-10-CM | POA: Insufficient documentation

## 2012-07-31 HISTORY — DX: Dorsalgia, unspecified: M54.9

## 2012-07-31 LAB — COMPREHENSIVE METABOLIC PANEL
AST: 36 U/L (ref 0–37)
Albumin: 3.7 g/dL (ref 3.5–5.2)
Alkaline Phosphatase: 84 U/L (ref 39–117)
BUN: 26 mg/dL — ABNORMAL HIGH (ref 6–23)
CO2: 27 mEq/L (ref 19–32)
Chloride: 103 mEq/L (ref 96–112)
Creatinine, Ser: 1 mg/dL (ref 0.50–1.10)
GFR calc non Af Amer: 60 mL/min — ABNORMAL LOW (ref >90)
Potassium: 4.1 mEq/L (ref 3.5–5.1)
Total Bilirubin: 0.2 mg/dL — ABNORMAL LOW (ref 0.3–1.2)

## 2012-07-31 LAB — CBC
HCT: 35.9 % — ABNORMAL LOW (ref 36.0–46.0)
MCV: 92.1 fL (ref 78.0–100.0)
Platelets: 202 10*3/uL (ref 150–400)
RBC: 3.9 MIL/uL (ref 3.87–5.11)
RDW: 13 % (ref 11.5–15.5)
WBC: 4.8 10*3/uL (ref 4.0–10.5)

## 2012-07-31 LAB — RAPID URINE DRUG SCREEN, HOSP PERFORMED
Amphetamines: NOT DETECTED
Opiates: POSITIVE — AB
Tetrahydrocannabinol: NOT DETECTED

## 2012-07-31 NOTE — ED Notes (Signed)
Breakfast tray has been ordered for patient 

## 2012-07-31 NOTE — BH Assessment (Addendum)
Assessment Note   [name] is an 62 y.o. female. Walk in from the [facility] after also being seen here last pm. Last pm from the note is the same presentation as this am. She is tearful and requesting a hospital stay for a night or two. She has been living with a man that is drinking and smoking pot with another man and she doesn't like it, she become the housemaid and he is unsupportive emotionally of her. She doesn't want to stay there. She is not suicidal or homicidal. She is not psychotic or delusional.She denies and drug or alcohol use and this is also why she is so unhappy with her currentl housing but friend does drink and smoke pot. He also has been served with an eviction notice and she is unaware of any future housing arrangement that he might be making.She is here primarily because she is homeless. She denies any health problems other that chronic back pain from back surgury where she has rods and pins in her spine which is why she receives dissability. She is wanting medications and was upset that they didn't do anything for her at the ED. She wants therapy and medications, stating she cant stop crying and has no one to talk to. She is not currently receiving outpatient treatment but states she did years ago from Dr. Dub Mikes. Arranged for her to start psych IOP on 08/07/2012 and contacted [facility] until she can start on th 6th here at IOP. Her contact information was provided to the [facility] and she was given there information too.She asked to call Donnie the man she was staying with to see if she even could go back there and she was told no she couldn't and he had already moved someone else in.This further upset her and stated she couldn't take any more stress and asked again if she couldn't voluntarily admit herself.She is still denying suicidal ideation and reports last and only previous attempt was about 15 years ago. I called  several agencies that provide emergency housing and found help at the [facility] but she needs a picture ID and a background check which will be free if she tells the [employer] its for the [employer]. This information was provided to her and she was ready to go out the door when she then was unable to find her keys, for the house she had been staying in and her own car keys.Keys are not here, called the [facility] to check if they are there, they said they would look and call back if they find them. She has a bus pass from yesterday from the ED with which she can go to her former house to get her things and make her way to the police dept to get her background check and on to the [employer]. She left and said she was going to the [facility] to check on her keys being left there from last pm and the am.MSE was completed by Verne Spurr PA .  Axis I: Depressive Disorder NOS Axis II: Deferred Axis III:  Past Medical History  Diagnosis Date  . Hypertension   . Depression   . Renal disorder kidney stones  . Back pain 05/30    history of back surgury and rods and pins   Axis IV: housing problems and problems with primary support group Axis V: 41-50 serious symptoms  Past Medical History:  Past Medical  History  Diagnosis Date  . Hypertension   . Depression   . Renal disorder kidney stones  . Back pain 05/30    history of back surgury and rods and pins    Past Surgical History  Procedure Laterality Date  . Abdominal hysterectomy    . Cholecystectomy    . Kidney stones    . Back surgery      Family History:  Family History  Problem Relation Age of Onset  . Alzheimer's disease Mother   . Heart failure Mother   . Stroke Father   . Coronary artery disease Sister   . Coronary artery disease Sister   . Coronary artery disease Sister     Social History:  reports that she has quit smoking. Her smoking use included Cigarettes. She smoked 0.25 packs per day. She has never used  smokeless tobacco. She reports that she does not drink alcohol or use illicit drugs.  Additional Social History:  Alcohol / Drug Use Pain Medications: not abusing Prescriptions: not abusing Over the Counter: not abusing History of alcohol / drug use?: No history of alcohol / drug abuse  CIWA:   COWS:    Allergies:  Allergies  Allergen Reactions  . Compazine Rash    Gets spots on skin and has to get it flushed out of her system  . Aspirin Other (See Comments)    Reaction unknown  . Dilaudid (Hydromorphone Hcl) Other (See Comments)    Reaction unknown  . Influenza Vaccines Other (See Comments)    Got pneumonia, will not take vaccine  . Metoclopramide Hcl Hives  . Zofran Hives    Home Medications:  (Not in a hospital admission)  OB/GYN Status:  No LMP recorded. Patient has had a hysterectomy.  General Assessment Data Location of Assessment: [facility] Assessment Services ACT Assessment: Yes Living Arrangements: Non-relatives/Friends (lives w female told her today she cant come back) Can pt return to current living arrangement?: No Admission Status: Other (Comment) Is patient capable of signing voluntary admission?: Yes Transfer from:  (came from [facility]) Referral Source: Self/Family/Friend  Education Status Is patient currently in school?: No Highest grade of school patient has completed: 14 years  Risk to self Suicidal Ideation: No Suicidal Intent: No Is patient at risk for suicide?: No Suicidal Plan?: No Access to Means: No What has been your use of drugs/alcohol within the last 12 months?: none Previous Attempts/Gestures: Yes How many times?: 1 (15 years ago) Other Self Harm Risks: none Triggers for Past Attempts: None known Intentional Self Injurious Behavior: None Family Suicide History: Unknown Recent stressful life event(s):  (today homeless) Persecutory voices/beliefs?: No Depression: Yes Depression Symptoms: Tearfulness;Fatigue Substance abuse history and/or  treatment for substance abuse?: No Suicide prevention information given to non-admitted patients: Not applicable  Risk to Others Homicidal Ideation: No Thoughts of Harm to Others: No Current Homicidal Intent: No Current Homicidal Plan: No Access to Homicidal Means: No History of harm to others?: No Assessment of Violence: None Noted Does patient have access to weapons?: No Criminal Charges Pending?: No Does patient have a court date: No  Psychosis Hallucinations: None noted Delusions: None noted  Mental Status Report Appear/Hygiene: Disheveled Eye Contact: Good Motor Activity: Freedom of movement Speech: Logical/coherent Level of Consciousness: Alert Mood: Depressed Affect: Depressed (tearful) Anxiety Level: Moderate Thought Processes: Coherent;Relevant Judgement: Impaired Orientation: Person;Place;Situation Obsessive Compulsive Thoughts/Behaviors: None  Cognitive Functioning Concentration: Normal Memory: Remote Intact;Recent Impaired IQ: Average Insight: Fair Impulse Control: Good Appetite: Good Weight Loss: 0 Weight  Gain: 0 Sleep: Decreased Total Hours of Sleep: 5 (current sleep pattern times 6 months) Vegetative Symptoms: None  ADLScreening [facility]) Patient's cognitive ability adequate to safely complete daily activities?: Yes Patient able to express need for assistance with ADLs?: Yes Independently performs ADLs?: Yes (appropriate for developmental age)  Abuse/Neglect [facility]) Physical Abuse: Denies Verbal Abuse: Denies Sexual Abuse: Denies  Prior Inpatient Therapy Prior Inpatient Therapy: Yes Prior Therapy Dates: 15 years ago [facility] Reason for Treatment: depression  Prior Outpatient Therapy Prior Outpatient Therapy: Yes Prior Therapy Dates: "years ago" Prior Therapy Facilty/Provider(s): Dr Dub Mikes Reason for Treatment: depression and anxiety  ADL Screening (condition at time of admission) Patient's  cognitive ability adequate to safely complete daily activities?: Yes Patient able to express need for assistance with ADLs?: Yes Independently performs ADLs?: Yes (appropriate for developmental age) Weakness of Legs: None Weakness of Arms/Hands: None  Home Assistive Devices/Equipment Home Assistive Devices/Equipment: None    Abuse/Neglect Assessment (Assessment to be complete while patient is alone) Physical Abuse: Denies Verbal Abuse: Denies Sexual Abuse: Denies Exploitation of patient/patient's resources: Denies Self-Neglect: Denies Values / Beliefs Cultural Requests During Hospitalization: None Spiritual Requests During Hospitalization: None     Nutrition Screen- [facility] Patient's home diet: Regular Have you recently lost weight without trying?: No Have you been eating poorly because of a decreased appetite?: No Malnutrition Screening Tool Score: 0  Additional Information 1:1 In Past 12 Months?: No CIRT Risk: No Elopement Risk: No Does patient have medical clearance?: No     Disposition:  Disposition Initial Assessment Completed for this Encounter: Yes Disposition of Patient: Referred to ([facility] and [facility] and [facility]) Type of outpatient treatment: Adult Patient referred to: Outpatient clinic referral (psych IOP and [facility])  On Site Evaluation by:   Reviewed with Physician:     Wynona Luna 07/31/2012 10:56 AM

## 2012-07-31 NOTE — ED Provider Notes (Signed)
History     CSN: [id number]  Arrival date & time 07/30/12  2341   First MD Initiated Contact with Patient 07/31/12 0249      Chief Complaint  Patient presents with  . Medical Clearance    (Consider location/radiation/quality/duration/timing/severity/associated sxs/prior treatment) HPI 62 yo female presents to the ER with complaint of depression and wishing admission.  Pt with multiple recent visits for chest pain and social issues.  She reports she is staying with a friend, but says that he and another roommate drink all the time and she is not comfortable staying there.  She denies SI/HI, no hallucinations.  No current tx for depression, no medications.  She does not want to go to the shelters.  She wishes admission either to the hospital or to a psych unit "for a few days"  Per records, pt just over a [facility], but did not meet criteria for admission there.  Past Medical History  Diagnosis Date  . Hypertension   . Depression   . Renal disorder kidney stones    Past Surgical History  Procedure Laterality Date  . Abdominal hysterectomy    . Cholecystectomy    . Kidney stones    . Back surgery      Family History  Problem Relation Age of Onset  . Alzheimer's disease Mother   . Heart failure Mother   . Stroke Father   . Coronary artery disease Sister   . Coronary artery disease Sister   . Coronary artery disease Sister     History  Substance Use Topics  . Smoking status: Former Smoker -- 0.25 packs/day    Types: Cigarettes  . Smokeless tobacco: Never Used  . Alcohol Use: No    OB History   Grav Para Term Preterm Abortions TAB SAB Ect Mult Living                  Review of Systems  Constitutional: Positive for fatigue.  Musculoskeletal: Positive for arthralgias.  Neurological: Positive for headaches.  Psychiatric/Behavioral: Positive for behavioral problems and dysphoric mood. The patient is nervous/anxious.     Allergies  Compazine; Aspirin; Dilaudid;  Influenza vaccines; Metoclopramide hcl; and Zofran  Home Medications   Current Outpatient Rx  Name  Route  Sig  Dispense  Refill  . acetaminophen (TYLENOL) 500 MG tablet   Oral   Take 1,000 mg by mouth every 6 (six) hours as needed for pain.         Marland Kitchen amLODipine (NORVASC) 5 MG tablet   Oral   Take 5 mg by mouth every morning.          . diclofenac sodium (VOLTAREN) 1 % GEL   Topical   Apply 2 g topically 4 (four) times daily. FOR BACK PAIN         . pantoprazole (PROTONIX) 40 MG tablet   Oral   Take 40 mg by mouth every morning.            BP 147/78  Pulse 78  Temp(Src) 97.8 F (36.6 C) (Oral)  Resp 18  Ht 5\' 3"  (1.6 m)  Wt 100 lb (45.36 kg)  BMI 17.72 kg/m2  SpO2 99%  Physical Exam  Nursing note and vitals reviewed. Constitutional: She is oriented to person, place, and time. She appears well-developed and well-nourished.  HENT:  Head: Normocephalic and atraumatic.  Nose: Nose normal.  Mouth/Throat: Oropharynx is clear and moist.  Eyes: Conjunctivae and EOM are normal. Pupils are equal, round, and  reactive to light.  Neck: Normal range of motion. Neck supple. No JVD present. No tracheal deviation present. No thyromegaly present.  Cardiovascular: Normal rate, regular rhythm, normal heart sounds and intact distal pulses.  Exam reveals no gallop and no friction rub.   No murmur heard. Pulmonary/Chest: Effort normal and breath sounds normal. No stridor. No respiratory distress. She has no wheezes. She has no rales. She exhibits no tenderness.  Abdominal: Soft. Bowel sounds are normal. She exhibits no distension and no mass. There is no tenderness. There is no rebound and no guarding.  Musculoskeletal: Normal range of motion. She exhibits no edema and no tenderness.  Lymphadenopathy:    She has no cervical adenopathy.  Neurological: She is alert and oriented to person, place, and time. She exhibits normal muscle tone. Coordination normal.  Skin: Skin is warm and  dry. No rash noted. No erythema. No pallor.  Psychiatric: Her behavior is normal. Judgment and thought content normal.  Reports depressed mood    ED Course  Procedures (including critical care time)  Labs Reviewed  CBC - Abnormal; Notable for the following:    HCT 35.9 (*)    All other components within normal limits  COMPREHENSIVE METABOLIC PANEL - Abnormal; Notable for the following:    BUN 26 (*)    Total Bilirubin 0.2 (*)    GFR calc non Af Amer 60 (*)    GFR calc Af Amer 69 (*)    All other components within normal limits  URINE RAPID DRUG SCREEN (HOSP PERFORMED) - Abnormal; Notable for the following:    Opiates POSITIVE (*)    Benzodiazepines POSITIVE (*)    All other components within normal limits  ETHANOL   Dg Chest 2 View  07/30/2012   *RADIOLOGY REPORT*  Clinical Data: Left-sided chest pain, nausea, history of hypertension and COPD, smoker  CHEST - 2 VIEW  Comparison: 07/26/2012; 06/24/2012  Findings: Grossly unchanged cardiac silhouette and mediastinal contours.  The lungs remain hyperexpanded with flattening of bilateral hemidiaphragms mild diffuse thickening of the pulmonary tissue.  No focal airspace opacity.  No pleural effusion or pneumothorax.  Unchanged bones including sequela of long segment paraspinal fusion within the superior aspect of the thoracic spine, incompletely evaluated.  IMPRESSION: Hyperexpanded lungs are acute cardiopulmonary disease.   Original Report Authenticated By: Tacey Ruiz, MD     1. Depression       MDM  62 year old female with depression.  She does not meet criteria for inpatient admission.  She's been explained this in the past.  We'll again give her her outpatient resources.        Olivia Mackie, MD 07/31/12 0730

## 2012-07-31 NOTE — ED Notes (Signed)
Pt was here yesterday for medical clearance, then discharged, seen tonight for chest pain and discharged, now she is requesting help with depression, She states she doesn't have anywhere to live and no support system.

## 2012-07-31 NOTE — ED Notes (Signed)
Pt getting dressed and will go to the lobby and wait for breakfast.

## 2012-07-31 NOTE — ED Notes (Signed)
Pt woke up and was acting confused about where she was and how she got here, she states that she didn't remember coming here or seeing the DR. RN re-oriented her and told her that after she eats breakfast she will be escorted to the bus stop as she has been discharged for 2 hours.

## 2012-07-31 NOTE — ED Notes (Signed)
Pt given resources for outpatient treatment. RN allowing patient to sleep until morning.

## 2012-08-01 ENCOUNTER — Encounter (HOSPITAL_COMMUNITY): Payer: Self-pay | Admitting: Emergency Medicine

## 2012-08-01 ENCOUNTER — Emergency Department (HOSPITAL_COMMUNITY)
Admission: EM | Admit: 2012-08-01 | Discharge: 2012-08-02 | Disposition: A | Discharge status: Home / Self Care | Payer: Medicare Other | Source: Home / Self Care | Attending: Emergency Medicine | Admitting: Emergency Medicine

## 2012-08-01 ENCOUNTER — Emergency Department (HOSPITAL_COMMUNITY)
Admission: EM | Admit: 2012-08-01 | Discharge: 2012-08-01 | Disposition: A | Discharge status: Home / Self Care | Payer: Medicare Other | Attending: Emergency Medicine | Admitting: Emergency Medicine

## 2012-08-01 DIAGNOSIS — R0789 Other chest pain: Secondary | ICD-10-CM

## 2012-08-01 DIAGNOSIS — Z79899 Other long term (current) drug therapy: Secondary | ICD-10-CM | POA: Insufficient documentation

## 2012-08-01 DIAGNOSIS — F17200 Nicotine dependence, unspecified, uncomplicated: Secondary | ICD-10-CM | POA: Insufficient documentation

## 2012-08-01 DIAGNOSIS — Z87448 Personal history of other diseases of urinary system: Secondary | ICD-10-CM | POA: Insufficient documentation

## 2012-08-01 DIAGNOSIS — G8929 Other chronic pain: Secondary | ICD-10-CM

## 2012-08-01 DIAGNOSIS — F329 Major depressive disorder, single episode, unspecified: Secondary | ICD-10-CM

## 2012-08-01 DIAGNOSIS — M549 Dorsalgia, unspecified: Secondary | ICD-10-CM | POA: Insufficient documentation

## 2012-08-01 DIAGNOSIS — Z8739 Personal history of other diseases of the musculoskeletal system and connective tissue: Secondary | ICD-10-CM | POA: Insufficient documentation

## 2012-08-01 DIAGNOSIS — F3289 Other specified depressive episodes: Secondary | ICD-10-CM | POA: Insufficient documentation

## 2012-08-01 DIAGNOSIS — R110 Nausea: Secondary | ICD-10-CM | POA: Insufficient documentation

## 2012-08-01 DIAGNOSIS — Z720 Tobacco use: Secondary | ICD-10-CM

## 2012-08-01 DIAGNOSIS — Z9889 Other specified postprocedural states: Secondary | ICD-10-CM | POA: Insufficient documentation

## 2012-08-01 DIAGNOSIS — F32A Depression, unspecified: Secondary | ICD-10-CM

## 2012-08-01 DIAGNOSIS — I10 Essential (primary) hypertension: Secondary | ICD-10-CM | POA: Insufficient documentation

## 2012-08-01 DIAGNOSIS — Z87891 Personal history of nicotine dependence: Secondary | ICD-10-CM | POA: Insufficient documentation

## 2012-08-01 LAB — RAPID URINE DRUG SCREEN, HOSP PERFORMED
Amphetamines: NOT DETECTED
Benzodiazepines: POSITIVE — AB
Cocaine: NOT DETECTED
Opiates: POSITIVE — AB
Tetrahydrocannabinol: NOT DETECTED

## 2012-08-01 LAB — CBC WITH DIFFERENTIAL/PLATELET
Basophils Absolute: 0 K/uL (ref 0.0–0.1)
Basophils Relative: 0 % (ref 0–1)
Eosinophils Absolute: 0.2 K/uL (ref 0.0–0.7)
Eosinophils Relative: 5 % (ref 0–5)
HCT: 35.9 % — ABNORMAL LOW (ref 36.0–46.0)
Hemoglobin: 12.3 g/dL (ref 12.0–15.0)
Lymphocytes Relative: 26 % (ref 12–46)
Lymphs Abs: 1.2 K/uL (ref 0.7–4.0)
MCH: 31.3 pg (ref 26.0–34.0)
MCHC: 34.3 g/dL (ref 30.0–36.0)
MCV: 91.3 fL (ref 78.0–100.0)
Monocytes Absolute: 0.5 10*3/uL (ref 0.1–1.0)
Monocytes Relative: 10 % (ref 3–12)
Neutro Abs: 2.7 10*3/uL (ref 1.7–7.7)
Neutrophils Relative %: 59 % (ref 43–77)
Platelets: 236 K/uL (ref 150–400)
RBC: 3.93 MIL/uL (ref 3.87–5.11)
RDW: 12.8 % (ref 11.5–15.5)
WBC: 4.6 K/uL (ref 4.0–10.5)

## 2012-08-01 LAB — COMPREHENSIVE METABOLIC PANEL
ALT: 31 U/L (ref 0–35)
ALT: 33 U/L (ref 0–35)
AST: 35 U/L (ref 0–37)
AST: 36 U/L (ref 0–37)
Albumin: 3.9 g/dL (ref 3.5–5.2)
Albumin: 4.4 g/dL (ref 3.5–5.2)
Alkaline Phosphatase: 66 U/L (ref 39–117)
Calcium: 9.5 mg/dL (ref 8.4–10.5)
Calcium: 10.2 mg/dL (ref 8.4–10.5)
GFR calc Af Amer: >90 mL/min (ref >90)
Potassium: 3.9 mEq/L (ref 3.5–5.1)
Sodium: 139 mEq/L (ref 135–145)
Sodium: 139 mEq/L (ref 135–145)
Total Protein: 7 g/dL (ref 6.0–8.3)
Total Protein: 7.6 g/dL (ref 6.0–8.3)

## 2012-08-01 LAB — LIPASE, BLOOD: Lipase: 27 U/L (ref 11–59)

## 2012-08-01 LAB — COMPREHENSIVE METABOLIC PANEL WITH GFR
BUN: 24 mg/dL — ABNORMAL HIGH (ref 6–23)
CO2: 26 meq/L (ref 19–32)
Chloride: 100 meq/L (ref 96–112)
Creatinine, Ser: 0.67 mg/dL (ref 0.50–1.10)
GFR calc non Af Amer: >90 mL/min (ref >90)
Glucose, Bld: 97 mg/dL (ref 70–99)
Total Bilirubin: 0.4 mg/dL (ref 0.3–1.2)

## 2012-08-01 LAB — POCT I-STAT TROPONIN I: Troponin i, poc: 0 ng/mL (ref 0.00–0.08)

## 2012-08-01 LAB — CBC
MCH: 31.4 pg (ref 26.0–34.0)
MCHC: 34.2 g/dL (ref 30.0–36.0)
Platelets: 258 10*3/uL (ref 150–400)

## 2012-08-01 MED ORDER — GI COCKTAIL ~~LOC~~
30.0000 mL | Freq: Once | ORAL | Status: AC
  Administered 2012-08-01: 30 mL via ORAL
  Filled 2012-08-01: qty 30

## 2012-08-01 MED ORDER — LORAZEPAM 1 MG PO TABS: 1.0000 mg | ORAL_TABLET | Freq: Three times a day (TID) | ORAL | Status: DC | PRN

## 2012-08-01 MED ORDER — PANTOPRAZOLE SODIUM 40 MG PO TBEC: 40.0000 mg | DELAYED_RELEASE_TABLET | Freq: Every morning | ORAL | Status: DC

## 2012-08-01 MED ORDER — DICLOFENAC SODIUM 1 % TD GEL
2.0000 g | Freq: Four times a day (QID) | TRANSDERMAL | Status: DC
  Administered 2012-08-01: 2 g via TOPICAL
  Filled 2012-08-01: qty 100

## 2012-08-01 MED ORDER — AMLODIPINE BESYLATE 5 MG PO TABS
5.0000 mg | ORAL_TABLET | Freq: Every morning | ORAL | Status: DC
  Filled 2012-08-01: qty 1

## 2012-08-01 MED ORDER — ALPRAZOLAM 0.5 MG PO TABS
0.5000 mg | ORAL_TABLET | Freq: Every evening | ORAL | Status: DC | PRN
Start: 2012-08-01 — End: 2012-08-04

## 2012-08-01 MED ORDER — LORAZEPAM 1 MG PO TABS
1.0000 mg | ORAL_TABLET | Freq: Once | ORAL | Status: AC
  Administered 2012-08-01: 1 mg via ORAL
  Filled 2012-08-01: qty 1

## 2012-08-01 MED ORDER — NICOTINE 21 MG/24HR TD PT24: 21.0000 mg | MEDICATED_PATCH | Freq: Every day | TRANSDERMAL | Status: DC

## 2012-08-01 MED ORDER — ZOLPIDEM TARTRATE 5 MG PO TABS: 5.0000 mg | ORAL_TABLET | Freq: Every evening | ORAL | Status: DC | PRN

## 2012-08-01 MED ORDER — ACETAMINOPHEN-CODEINE #3 300-30 MG PO TABS
1.0000 | ORAL_TABLET | Freq: Once | ORAL | Status: AC
  Administered 2012-08-01: 1 via ORAL
  Filled 2012-08-01: qty 1

## 2012-08-01 NOTE — ED Provider Notes (Signed)
History    This chart was scribed for a non-physician practitioner, Dierdre Forth, PA-C, working with Ward Givens, MD by Frederik Pear, ED Scribe. This patient was seen in room TR11C/TR11C and the patient's care was started at 2004.   CSN: [id number]  Arrival date & time 08/01/12  1823   First MD Initiated Contact with Patient 08/01/12 2004      Chief Complaint  Patient presents with  . Medical Clearance    (Consider location/radiation/quality/duration/timing/severity/associated sxs/prior treatment) The history is provided by the patient and medical records. No language interpreter was used.   HPI Comments: [name] is a 62 y.o. female with a h/o of depression with numerous ED visits and chronic back pain who presents to the Emergency Department complaining of medical clearance for depression. She reports that she has been feeling depressed and crying a lot for the last month and a half. She states that she is concerned because of her financial and living situation. She states that she feels as if she doesn't have the resources to pay for her car payment or insurance. She states that she has has two roommates who drink constantly, and she does not like her living situation. Her medical records indicate that she called EMS to pick her up at a homeless shelter on 05/29. She denies HI and SI, but reports she has thought about cutting herself, but that she doesn't want to die. She also complains of auditory and visual hallucinations in the form of her mother and father.   Past Medical History  Diagnosis Date  . Hypertension   . Depression   . Renal disorder kidney stones  . Back pain 05/30    history of back surgury and rods and pins    Past Surgical History  Procedure Laterality Date  . Abdominal hysterectomy    . Cholecystectomy    . Kidney stones    . Back surgery      Family History  Problem Relation Age of Onset  . Alzheimer's disease Mother   . Heart failure  Mother   . Stroke Father   . Coronary artery disease Sister   . Coronary artery disease Sister   . Coronary artery disease Sister     History  Substance Use Topics  . Smoking status: Former Smoker -- 0.25 packs/day    Types: Cigarettes  . Smokeless tobacco: Never Used  . Alcohol Use: No    OB History   Grav Para Term Preterm Abortions TAB SAB Ect Mult Living                  Review of Systems  Constitutional: Negative for fever, diaphoresis, appetite change, fatigue and unexpected weight change.  HENT: Negative for mouth sores and neck stiffness.   Eyes: Negative for visual disturbance.  Respiratory: Negative for cough, chest tightness, shortness of breath and wheezing.   Cardiovascular: Negative for chest pain.  Gastrointestinal: Negative for nausea, vomiting, abdominal pain, diarrhea and constipation.  Endocrine: Negative for polydipsia, polyphagia and polyuria.  Genitourinary: Negative for dysuria, urgency, frequency and hematuria.  Musculoskeletal: Negative for back pain.  Skin: Negative for rash.  Allergic/Immunologic: Negative for immunocompromised state.  Neurological: Negative for syncope, light-headedness and headaches.  Hematological: Does not bruise/bleed easily.  Psychiatric/Behavioral: Positive for self-injury. Negative for suicidal ideas and sleep disturbance. The patient is not nervous/anxious.        Depressed    Allergies  Compazine; Aspirin; Dilaudid; Influenza vaccines; Metoclopramide hcl; and Zofran  Home Medications   Current Outpatient Rx  Name  Route  Sig  Dispense  Refill  . acetaminophen (TYLENOL) 500 MG tablet   Oral   Take 500 mg by mouth every 6 (six) hours as needed for pain.          Marland Kitchen ALPRAZolam (XANAX) 0.5 MG tablet   Oral   Take 1 tablet (0.5 mg total) by mouth at bedtime as needed for sleep.   12 tablet   0   . amLODipine (NORVASC) 5 MG tablet   Oral   Take 5 mg by mouth every morning.          . diclofenac sodium  (VOLTAREN) 1 % GEL   Topical   Apply 2 g topically 4 (four) times daily. FOR BACK PAIN         . pantoprazole (PROTONIX) 40 MG tablet   Oral   Take 40 mg by mouth every morning.            BP 140/94  Pulse 105  Temp(Src) 97.7 F (36.5 C) (Oral)  Resp 16  SpO2 98%  Physical Exam  Nursing note and vitals reviewed. Constitutional: She appears well-developed and well-nourished. No distress.  HENT:  Head: Normocephalic and atraumatic.  Mouth/Throat: Oropharynx is clear and moist. No oropharyngeal exudate.  Eyes: Conjunctivae are normal. No scleral icterus.  Neck: Normal range of motion. Neck supple.  Cardiovascular: Normal rate, regular rhythm and intact distal pulses.   Pulmonary/Chest: Effort normal and breath sounds normal. No respiratory distress. She has no wheezes.  Abdominal: Soft. Bowel sounds are normal. She exhibits no mass. There is no tenderness. There is no rebound and no guarding.  Musculoskeletal: Normal range of motion. She exhibits no edema.  Neurological: She is alert.  Speech is clear and goal oriented Moves extremities without ataxia  Skin: Skin is warm and dry. She is not diaphoretic.  Psychiatric: She exhibits a depressed mood.  Tearful on exam when she begins talking about where she lives. She wishes to be admitted to a psychiatric facility.    ED Course  Procedures (including critical care time)  DIAGNOSTIC STUDIES: Oxygen Saturation is 98% on room air, normal by my interpretation.    COORDINATION OF CARE:  20:47- Discussed planned course of treatment with the patient, including an ACT consult, who is agreeable at this time.  Results for orders placed during the hospital encounter of 08/01/12  ACETAMINOPHEN LEVEL      Result Value Range   Acetaminophen (Tylenol), Serum <15.0  10 - 30 ug/mL  CBC      Result Value Range   WBC 4.7  4.0 - 10.5 K/uL   RBC 4.20  3.87 - 5.11 MIL/uL   Hemoglobin 13.2  12.0 - 15.0 g/dL   HCT 16.1  09.6 - 04.5 %    MCV 91.9  78.0 - 100.0 fL   MCH 31.4  26.0 - 34.0 pg   MCHC 34.2  30.0 - 36.0 g/dL   RDW 40.9  81.1 - 91.4 %   Platelets 258  150 - 400 K/uL  COMPREHENSIVE METABOLIC PANEL      Result Value Range   Sodium 139  135 - 145 mEq/L   Potassium 3.7  3.5 - 5.1 mEq/L   Chloride 99  96 - 112 mEq/L   CO2 28  19 - 32 mEq/L   Glucose, Bld 124 (*) 70 - 99 mg/dL   BUN 27 (*) 6 - 23 mg/dL   Creatinine,  Ser 0.96  0.50 - 1.10 mg/dL   Calcium 27.2  8.4 - 53.6 mg/dL   Total Protein 7.6  6.0 - 8.3 g/dL   Albumin 4.4  3.5 - 5.2 g/dL   AST 36  0 - 37 U/L   ALT 33  0 - 35 U/L   Alkaline Phosphatase 75  39 - 117 U/L   Total Bilirubin 0.3  0.3 - 1.2 mg/dL   GFR calc non Af Amer 63 (*) >90 mL/min   GFR calc Af Amer 72 (*) >90 mL/min  ETHANOL      Result Value Range   Alcohol, Ethyl (B) <11  0 - 11 mg/dL  SALICYLATE LEVEL      Result Value Range   Salicylate Lvl <2.0 (*) 2.8 - 20.0 mg/dL  URINE RAPID DRUG SCREEN (HOSP PERFORMED)      Result Value Range   Opiates POSITIVE (*) NONE DETECTED   Cocaine NONE DETECTED  NONE DETECTED   Benzodiazepines POSITIVE (*) NONE DETECTED   Amphetamines NONE DETECTED  NONE DETECTED   Tetrahydrocannabinol NONE DETECTED  NONE DETECTED   Barbiturates POSITIVE (*) NONE DETECTED    Labs Reviewed  COMPREHENSIVE METABOLIC PANEL - Abnormal; Notable for the following:    Glucose, Bld 124 (*)    BUN 27 (*)    GFR calc non Af Amer 63 (*)    GFR calc Af Amer 72 (*)    All other components within normal limits  SALICYLATE LEVEL - Abnormal; Notable for the following:    Salicylate Lvl <2.0 (*)    All other components within normal limits  URINE RAPID DRUG SCREEN (HOSP PERFORMED) - Abnormal; Notable for the following:    Opiates POSITIVE (*)    Benzodiazepines POSITIVE (*)    Barbiturates POSITIVE (*)    All other components within normal limits  ACETAMINOPHEN LEVEL  CBC  ETHANOL   No results found.   1. Depression   2. Chronic back pain   3. Tobacco use        MDM  [name] presents with complaints of depression and wishing to harm herself but not wanting to die. Patient has been seen 13 times the last 6 months for her depression. She is currently homeless and was seen yesterday looking for a place to sleep. Patient takes me tonight to keep her least overnight.  Patient tearful on exam and also stating that she is hungry. She states she has been treated for depression in the past but is not currently taking any medications.  She is regularly given outpatient followup for depression and was recently (07/31/2012) evaluated by behavioral health for the same.  ACT consult in and will evaluate and assist with the disposition. I do not believe the patient is a telephonic at this time. She denies suicidal and homicidal ideations as well as auditory and visual hallucinations.  Patient's drug screen positive for opiates, benzodiazepines (for which she has an Rx) and barbiturates.  Previous medical records show concern for malingering.  Patient is medically cleared to be moved to [facility] while waiting on ACT consult. Psych holding orders and home medications ordered.  I personally performed the services described in this documentation, which was scribed in my presence. The recorded information has been reviewed and is accurate.       Dahlia Client Khiara Shuping, PA-C 08/02/12 0003

## 2012-08-01 NOTE — ED Notes (Signed)
Pt denied any issues post receiving medication. Pt discharged. She was alert and oriented. Pt refused using a wheelchair. Pt had her suitcase and hospital bag upon discharge. No pain at the time.

## 2012-08-01 NOTE — ED Notes (Signed)
Coffee and Blankets given to pt. Okayed by PA.

## 2012-08-01 NOTE — ED Provider Notes (Signed)
History     CSN: [id number]  Arrival date & time 08/01/12  1253   First MD Initiated Contact with Patient 08/01/12 1254      Chief Complaint  Patient presents with  . Chest Pain  . Depression    (Consider location/radiation/quality/duration/timing/severity/associated sxs/prior treatment) HPI Comments: Pt with complaint of CP worsening since about 5 minutes after getting up this AM around 0800.  Pt denies cardic history.  She has been seen multiple times recently in the ED due to depression and not wanting to lie at the apartment that she is at currently. She reports she has no transportation, and her current siblings that live in [place] seem to not wish to have anything to do with her and she doesn't know why.  She requests admission at a psychiatric hospital.  She denies SI, HI or plan.  No hallucinations.  She had some nausea with CP this AM.  She did not take any meds for symptoms.  EMS provided IV zofran en route with minimal improvement. She is tearful.  She has a PCP Dr. Quintella Reichert but reports cannot get there due to no transportation.  I have asked her if she knows about [facility] and [facility] and she says she does not (prior records show pt has been seen by case management and social work numerous times for this already.)  No fevers, chills, URI symptoms.  No vomiting or diarrhea.     Patient is a 62 y.o. female presenting with chest pain. The history is provided by the patient, medical records and the EMS personnel.  Chest Pain Associated symptoms: nausea   Associated symptoms: no abdominal pain, no cough, no fever, no shortness of breath and not vomiting     Past Medical History  Diagnosis Date  . Hypertension   . Depression   . Renal disorder kidney stones  . Back pain 05/30    history of back surgury and rods and pins    Past Surgical History  Procedure Laterality Date  . Abdominal hysterectomy    . Cholecystectomy    . Kidney stones    . Back  surgery      Family History  Problem Relation Age of Onset  . Alzheimer's disease Mother   . Heart failure Mother   . Stroke Father   . Coronary artery disease Sister   . Coronary artery disease Sister   . Coronary artery disease Sister     History  Substance Use Topics  . Smoking status: Former Smoker -- 0.25 packs/day    Types: Cigarettes  . Smokeless tobacco: Never Used  . Alcohol Use: No    OB History   Grav Para Term Preterm Abortions TAB SAB Ect Mult Living                  Review of Systems  Constitutional: Negative for fever and chills.  HENT: Negative for congestion and rhinorrhea.   Respiratory: Negative for cough and shortness of breath.   Cardiovascular: Positive for chest pain.  Gastrointestinal: Positive for nausea. Negative for vomiting, abdominal pain, diarrhea and abdominal distention.  Psychiatric/Behavioral: Negative for suicidal ideas. The patient is not nervous/anxious.   All other systems reviewed and are negative.    Allergies  Compazine; Aspirin; Dilaudid; Influenza vaccines; Metoclopramide hcl; and Zofran  Home Medications   Current Outpatient Rx  Name  Route  Sig  Dispense  Refill  . acetaminophen (TYLENOL) 500 MG tablet   Oral   Take  1,000 mg by mouth every 6 (six) hours as needed for pain.         Marland Kitchen amLODipine (NORVASC) 5 MG tablet   Oral   Take 5 mg by mouth every morning.          . diclofenac sodium (VOLTAREN) 1 % GEL   Topical   Apply 2 g topically 4 (four) times daily. FOR BACK PAIN         . pantoprazole (PROTONIX) 40 MG tablet   Oral   Take 40 mg by mouth every morning.          Marland Kitchen ALPRAZolam (XANAX) 0.5 MG tablet   Oral   Take 1 tablet (0.5 mg total) by mouth at bedtime as needed for sleep.   12 tablet   0     BP 135/88  Pulse 85  Temp(Src) 98.4 F (36.9 C) (Oral)  Resp 16  SpO2 100%  Physical Exam  Nursing note and vitals reviewed. Constitutional: She appears well-developed and well-nourished. No  distress.  HENT:  Head: Normocephalic and atraumatic.  Eyes: EOM are normal. No scleral icterus.  Neck: Normal range of motion. Neck supple.  Cardiovascular: Normal rate, regular rhythm and intact distal pulses.   No murmur heard. Pulmonary/Chest: Effort normal. No respiratory distress. She has no wheezes.  Abdominal: Soft. Bowel sounds are normal. She exhibits no distension. There is no tenderness. There is no rebound.  Neurological: She is alert. Coordination normal.  Skin: Skin is warm and dry. No rash noted. She is not diaphoretic.  Psychiatric: Her speech is normal and behavior is normal. Judgment and thought content normal. Cognition and memory are normal. She exhibits a depressed mood.  Tearful when she begins talking about where she lives and how she wishes to be admitted to a psychiatric facility    ED Course  Procedures (including critical care time)  Labs Reviewed  COMPREHENSIVE METABOLIC PANEL - Abnormal; Notable for the following:    BUN 24 (*)    All other components within normal limits  CBC WITH DIFFERENTIAL - Abnormal; Notable for the following:    HCT 35.9 (*)    All other components within normal limits  LIPASE, BLOOD  POCT I-STAT TROPONIN I   No results found.   1. Atypical chest pain   2. Depression     ra sat is 100% and I interpret to be normal  ECG at time 12:59 shows SR at rate 98, normal axis, RSR` in leads V1-V2, likely normal variant, possible right atrial enlargement.  Borderline ECG, no sig change from 07/30/12.    3:28 PM Troponin is again negative, similar to prior ED visit.  Pt reports pain did improve, although transiently.  Will give a short Rx of xanax for anxiety and depression.  Pt gvien resources and recommendations to follow up at [facility].   MDM    Pt with CP in the setting of depression.  No SI.  Pt has been seen numerous times for this in the past.  No prior cardiac history.  Her PE is inconsistent, at times she has upper abdominal  tenderness, but when distracted, she does not.  I suspect some degree of malingering here.  She has a h/o depression, is on no meds, I have counseled her about using [facility] as a outpt resource for depression.  Prior records show she was admitted to [facility] for CP, found to be chest wall pain on 07/27/12, was seen at [facility] on 5/29 requesting admission there  and pt didn't meet inpt criteria at that time and given outpt resources.  She was seen at [facility] ED on the night of 5/29 and into AM of 5/30.          Gavin Pound. Annia Gomm, MD 08/01/12 1528

## 2012-08-01 NOTE — ED Notes (Signed)
Given a sandwich and coke per PA.

## 2012-08-01 NOTE — Discharge Instructions (Signed)
 RESOURCE GUIDE  Chronic Pain Problems: Contact [facility]  [phone] Patients need to be referred by their primary care doctor.  Insufficient Money for Medicine: Contact United Way:  call 211.   No Primary Care Doctor: - Call Health Connect  [phone] - can help you locate a primary care doctor that  accepts your insurance, provides certain services, etc. - Physician Referral Service- [phone]  Agencies that provide inexpensive medical care: - [facility]  [phone] - [facility]  [phone] - [facility]  [phone] - Women's Clinic  [phone] - Planned Parenthood  [phone] [facility]  [phone]  Medicaid-accepting [facility] Providers: - [facility]- [address], [address]  [phone], Mon-Fri 9am-7pm, Sat 9am-1pm - [facility]- [address] [place], Suite [place]  [phone] - [facility]- [address], [address] [place]  [phone] [facility]- [address]  [phone] - Kennieth Leech- [address] [place], [address], [phone]  Only accepts [place] patients after they have their name  applied to their card  Self Pay (no insurance) in [facility]: - Sickle Cell Patients - [facility]  [address] [place], [phone] - [facility]- [address] [place]  [phone]       [facility] [place]- [address]       -     [facility]- see information above (Speak to [employer] if you do not have insurance)       -  [facility] [address],  [phone]       -  [facility]- [address] [phone]       -  Dr Catalina-  [address], [place], [phone]       -  [facility] - [address], [phone]       -  [facility]- [address], [phone], also [address], [phone]       -     [facility]- [address] [place], [phone], 1st & 3rd Saturday         every month, 10am-1pm  -     [facility]   [address] [place], [place].   Phone:  [phone], Fax:  [phone]. Hours of Operation:  9 am - 6 pm, M-F.  -     [facility]   [address], [place]   Phone: [phone], Fax: [phone]. Hours of Operation:  8:30 am - 5:30 pm, M-F.  [facility] [address] [place], [place] [address] [phone]  [facility] [address] [place], [place] [address] [phone]  1) Find a Doctor and Pay Out of Pocket Although you won't have to find out who is covered by your insurance plan, it is a good idea to ask around and get recommendations. You will then need to call the office and see if the doctor you have chosen will accept you as a new patient and what types of options they offer for patients who are self-pay. Some doctors offer discounts or will set up payment plans for their patients who do not have insurance, but  you will need to ask so you aren't surprised when you get to your appointment.  2) Contact Your Local Health Department Not all health departments have doctors that can see patients for sick visits, but many do, so it is worth a call to see if yours does. If you don't know where your local health department is, you can check in your phone book. The CDC also has a tool to help you locate your state's health department, and many state websites also have listings of all of their local health departments.  3) Find a Walk-in Clinic If your illness is not likely to be very severe or complicated, you may want to try a walk in clinic. These are popping up all over the country in pharmacies, drugstores, and shopping centers. They're usually staffed by nurse practitioners or physician assistants that have been trained to  treat common illnesses and complaints. They're usually fairly quick and inexpensive. However, if you have serious medical issues or chronic medical problems, these are probably not your best option  STD Testing - [facility] Department of [facility], STD Clinic, [address] [place], phone [phone] or [phone].  Monday - Friday, call for an appointment. [facility] Department of [employer], STD Clinic, [place] [address], [place], phone [phone] or [phone].  Monday - Friday, call for an appointment.  Abuse/Neglect: [facility] Child Abuse Hotline 210-147-4525 [facility] Child Abuse Hotline [phone] (After Hours)  Emergency Shelter:  [facility] [phone]  Maternity Homes: - Room at the [place] [facility] [phone] - [facility] [phone]  MRSA Hotline #:   267-268-7974  Dental Assistance If unable to pay or uninsured, contact:  [facility] to become qualified for the adult dental clinic.  Patients with Medicaid: [facility] [phone] [address] [place], [phone] [address], [phone]  If unable to pay, or uninsured, contact [facility] [phone] in [place], [phone] in [facility]) to become qualified for the adult dental clinic  [facility] [address] [place], [place] [address] [phone] [email or web address]  Other Proofreader Services: - [facility]- [address] [place], [place], [place], [address], [phone], Ext. [phone], 2nd and 4th Thursday of the month at 6:30am.  10 clients each day by appointment, can sometimes see walk-in patients if someone does not show for an appointment. [facility]- [address] [place], [place], [address], [phone] - [facility] [address], [place], [place], [address], [phone] - [place]  Health Department- [phone] [facility] Health Department- [phone] [facility] Health [phone]       Behavioral Health Resources in the [facility]  Intensive Outpatient Programs: [facility]      [address] [place], [place] [phone] Both a day and evening program       [facility] Outpatient     [address]        [place], [place] [address] [phone]         ADS: [facility] [address] [place] [phone]  [facility] ACCESS LINE: [phone] or [phone] [address] [place], [place] [address] [email or web address]   Substance Abuse Resources: - [facility] Drug Services  [phone] - [facility] [phone] - [facility] [phone] [facility] [phone] -  Residential & Outpatient Substance Abuse Program  [phone]  Psychological Services: [facility]  ([phone] Services  [phone] - [facility], [phone] [place]. [address] [place], ACCESS LINE: 9704903030 or [phone], [email or web address]  Mobile Crisis Teams:                                        [facility]         Mobile Crisis Care Unit [phone]             [facility] [address] [place] [phone]                                         Interventionist [address] [place] [address] [place] [phone]  Self-Help/Support Groups: Mental Health Assoc. of [facility] [employer] of support groups ([phone])393-7402 (call for more info)  Narcotics Anonymous (NA) Caring Services [address] [place] - 2 meetings at this location  Residential Treatment Programs:  [facility]      [address]        [place]       [phone]         [facility] [address], [place] [address] [place], [place]  [address] [phone]  [facility]  [address] [place], [place] [address] [phone] Admissions: 8am-3pm M-F  [facility]     [address]        [place], [place] [address]       [phone]         [facility] [address] Christianna COYER [place], [place] [phone]  [facility] [address] [place], [place] [phone]  [facility] - Intensive Outpatient      [address]     [place], [place]       [phone]         [facility] ([facility]     [address] [place], [place] [phone] or [phone]  Residential Treatment Services (RTS), Medicaid [address] [place], [place] [phone]  [facility] [address]                                               [address] [place] [phone]  [facility] Resources: [phone]               General Therapy                                                Mliss Rival, PhD        [address] [place], [place] [address]         [phone]   Insurance  [facility]al   [address] [place], [place] [address] [phone]  [facility] [address] [place], [place] [address] [phone] Insurance/Medicaid/sponsorship through [facility] and Families                                              [address]                                        [place], [place] [address]    Therapy/tele-psych/case         [phone]          [facility] [address], [place]  [address]  Adolescent/group home/case management [phone]                                           Recardo Rival PhD       General therapy       Insurance   [phone]         Dr. Curry, Petros, M-F [phone]  [facility] [place]  [employer] [facility] Dept. [address]                 [address]         [address] [place] [address]  Tinnie Keenan Keenan Phone:  [phone]                                  Phone:  [phone]                   Phone:  [phone]  [facility], [phone] - [facility] - [facility]- [phone]       -     [facility] in [place], [address]             [phone], [facility] Child Abuse Hotline [phone] or [phone] (After Hours)      Narcotic and benzodiazepine use may cause drowsiness, slowed breathing or dependence.  Please use with caution and do not drive, operate machinery or watch young children alone while taking them.  Taking combinations of these medications or drinking alcohol will potentiate these effects.

## 2012-08-01 NOTE — ED Notes (Signed)
Per EMS, Pt came to the ED because she has been having midsternal CP since this morning. She stated that she has been nausea but no vomiting. She stated once that her chest pain was worse when she took deep breaths. EMS gave pt 324mg  ASA PO, 4mg  Zofran IM, and 2 Nitroglycerin SL. Pt currently tearful, stating that she does not want to go back to her home. Pt stated that she is not suicidal or homicidal. No IV. CBG 106.

## 2012-08-01 NOTE — ED Notes (Signed)
Contacted house coverage regarding sitter.  sts she is on the list.

## 2012-08-01 NOTE — ED Notes (Signed)
Pt sts she does not like her current living conditions; pt sts "they drink and throw up on the floor and leave the dishes for me"; pt is tearful; pt seen here multiple times recently for eval; pt sts vague possible thoughts of harming herself if she has to return to current living situation

## 2012-08-02 ENCOUNTER — Emergency Department (HOSPITAL_COMMUNITY)
Admission: EM | Admit: 2012-08-02 | Discharge: 2012-08-02 | Disposition: A | Discharge status: Home / Self Care | Payer: Medicare Other | Source: Home / Self Care | Attending: Emergency Medicine | Admitting: Emergency Medicine

## 2012-08-02 ENCOUNTER — Encounter (HOSPITAL_COMMUNITY): Payer: Self-pay | Admitting: *Deleted

## 2012-08-02 ENCOUNTER — Emergency Department (HOSPITAL_COMMUNITY)
Admission: EM | Admit: 2012-08-02 | Discharge: 2012-08-02 | Disposition: A | Discharge status: Home / Self Care | Payer: Medicare Other | Attending: Emergency Medicine | Admitting: Emergency Medicine

## 2012-08-02 DIAGNOSIS — F32A Depression, unspecified: Secondary | ICD-10-CM

## 2012-08-02 DIAGNOSIS — I10 Essential (primary) hypertension: Secondary | ICD-10-CM | POA: Insufficient documentation

## 2012-08-02 DIAGNOSIS — F3289 Other specified depressive episodes: Secondary | ICD-10-CM | POA: Insufficient documentation

## 2012-08-02 DIAGNOSIS — Z8739 Personal history of other diseases of the musculoskeletal system and connective tissue: Secondary | ICD-10-CM | POA: Insufficient documentation

## 2012-08-02 DIAGNOSIS — F329 Major depressive disorder, single episode, unspecified: Secondary | ICD-10-CM

## 2012-08-02 DIAGNOSIS — R071 Chest pain on breathing: Secondary | ICD-10-CM | POA: Insufficient documentation

## 2012-08-02 DIAGNOSIS — Z79899 Other long term (current) drug therapy: Secondary | ICD-10-CM | POA: Insufficient documentation

## 2012-08-02 DIAGNOSIS — Z87891 Personal history of nicotine dependence: Secondary | ICD-10-CM | POA: Insufficient documentation

## 2012-08-02 DIAGNOSIS — Z87448 Personal history of other diseases of urinary system: Secondary | ICD-10-CM | POA: Insufficient documentation

## 2012-08-02 DIAGNOSIS — F39 Unspecified mood [affective] disorder: Secondary | ICD-10-CM | POA: Insufficient documentation

## 2012-08-02 DIAGNOSIS — R0789 Other chest pain: Secondary | ICD-10-CM

## 2012-08-02 MED ORDER — IBUPROFEN 800 MG PO TABS
800.0000 mg | ORAL_TABLET | Freq: Once | ORAL | Status: AC
  Administered 2012-08-02: 800 mg via ORAL
  Filled 2012-08-02: qty 1

## 2012-08-02 NOTE — ED Provider Notes (Signed)
Patient has had multiple ED visits recently. She has chronic back pain and history of depression. She states her PCP is Dr. Quintella Reichert and she ran out of her depression medicine about a month ago. She states she feels very depressed and she cries a lot. She denies feeling suicidal or homicidal. She also reports she lives with "too drunk" and wants to get her a place. She states her last psych admission was 5-6 years ago.  Patient is alert and cooperative with flat affect she is in no acute distress   Medical screening examination/treatment/procedure(s) were conducted as a shared visit with non-physician practitioner(s) and myself.  I personally evaluated the patient during the encounter  Devoria Albe, MD, Franz Dell, MD 08/02/12 [phone]

## 2012-08-02 NOTE — Progress Notes (Signed)
Pt provided homeless facility resources for [place], [employer] and [place]. Denice Bors, AADC 08/02/2012 9:02 AM

## 2012-08-02 NOTE — ED Notes (Signed)
Pt given the phone to make calls...states that she left a message for someone to come pick her up. Pt walked to the waiting with belongings.

## 2012-08-02 NOTE — ED Notes (Signed)
Pt encouraged to go to out pt therapy

## 2012-08-02 NOTE — ED Notes (Signed)
Case management at bedside. Case management placing a consult for social work.

## 2012-08-02 NOTE — ED Notes (Signed)
MD at bedside. 

## 2012-08-02 NOTE — ED Notes (Signed)
DELAY IN DISCHARGE ATTEMPTING TO FIND PLACEMENT -[facility] REPORTS PT HAS ROOM. IF PT MEET CRITERIA- CALLED SPOKE WITH [place] RN [phone]. Marland Kitchen  CHARGE JEN RN MADE AWARE OF ARRANGEMENTS.  CALLED [facility] FOR ASSISTED TO TRANSPORT PT TO [facility].  Marland Kitchen

## 2012-08-02 NOTE — ED Notes (Signed)
Drink given to pt. 

## 2012-08-02 NOTE — ED Notes (Signed)
ZOX:[id number]<AV> Expected date:<BR> Expected time:<BR> Means of arrival:<BR> Comments:<BR> [facility] chest pain

## 2012-08-02 NOTE — ED Provider Notes (Signed)
History     CSN: [id number]  Arrival date & time 08/02/12  1528   First MD Initiated Contact with Patient 08/02/12 1559      No chief complaint on file.   (Consider location/radiation/quality/duration/timing/severity/associated sxs/prior treatment) HPI Comments: 62 year old female presents emergency department for the 12th time in the past 16 days complaining of left-sided chest wall pain. She was [facility] at [facility] emergency department earlier today, diagnosed with depression and chest wall pain, advised to go to behavioral health. Patient went over to behavioral health for evaluation and states "the man told me I was having chest pain, called 911 and family to the emergency department". According to triage summary, patient told the intake at [facility] that she was having chest pain. Pain only present when she presses in the left side of her chest. Admits to severe depression to the point where she does not want to live, however denies SI. She is requesting admission to the psych ward "for a few days" however does not state any specific reason why, "just because". According to patient's record, she was seen by Berna Spare with ACT team last night, given referrals to outpatient clnics and an admission to [facility].  The history is provided by the patient.    Past Medical History  Diagnosis Date  . Hypertension   . Depression   . Renal disorder kidney stones  . Back pain 05/30    history of back surgury and rods and pins    Past Surgical History  Procedure Laterality Date  . Abdominal hysterectomy    . Cholecystectomy    . Kidney stones    . Back surgery      Family History  Problem Relation Age of Onset  . Alzheimer's disease Mother   . Heart failure Mother   . Stroke Father   . Coronary artery disease Sister   . Coronary artery disease Sister   . Coronary artery disease Sister     History  Substance Use Topics  . Smoking status: Former Smoker -- 0.25 packs/day   Types: Cigarettes  . Smokeless tobacco: Never Used  . Alcohol Use: No    OB History   Grav Para Term Preterm Abortions TAB SAB Ect Mult Living                  Review of Systems  Cardiovascular: Positive for chest pain.  Musculoskeletal:       Positive for chest wall pain.  Psychiatric/Behavioral: Positive for dysphoric mood. Negative for suicidal ideas.  All other systems reviewed and are negative.    Allergies  Compazine; Aspirin; Dilaudid; Influenza vaccines; Metoclopramide hcl; and Zofran  Home Medications   Current Outpatient Rx  Name  Route  Sig  Dispense  Refill  . acetaminophen (TYLENOL) 500 MG tablet   Oral   Take 500 mg by mouth every 6 (six) hours as needed for pain.          Marland Kitchen amLODipine (NORVASC) 5 MG tablet   Oral   Take 5 mg by mouth every morning.          . diclofenac sodium (VOLTAREN) 1 % GEL   Topical   Apply 2 g topically 4 (four) times daily. FOR BACK PAIN         . pantoprazole (PROTONIX) 40 MG tablet   Oral   Take 40 mg by mouth every morning.          Marland Kitchen ALPRAZolam (XANAX) 0.5 MG tablet  Oral   Take 1 tablet (0.5 mg total) by mouth at bedtime as needed for sleep.   12 tablet   0     BP 152/82  Pulse 82  Temp(Src) 98.3 F (36.8 C) (Oral)  Resp 14  SpO2 95%  Physical Exam  Nursing note and vitals reviewed. Constitutional: She is oriented to person, place, and time. She appears well-developed and well-nourished. No distress.  HENT:  Head: Normocephalic and atraumatic.  Mouth/Throat: Oropharynx is clear and moist.  Eyes: Conjunctivae are normal.  Neck: Normal range of motion. Neck supple.  Cardiovascular: Normal rate, regular rhythm and normal heart sounds.   Pulmonary/Chest: Effort normal and breath sounds normal. She has no decreased breath sounds. She has no wheezes. She has no rhonchi. She has no rales.    Abdominal: Soft. Bowel sounds are normal. There is no tenderness.  Musculoskeletal: Normal range of motion.  She exhibits no edema.  Neurological: She is alert and oriented to person, place, and time.  Skin: Skin is warm and dry. She is not diaphoretic.  Psychiatric: Her behavior is normal. She exhibits a depressed mood. She expresses no homicidal and no suicidal ideation.    ED Course  Procedures (including critical care time)  Labs Reviewed - No data to display No results found.   Date: 08/02/2012  Rate: 80  Rhythm: normal sinus rhythm  QRS Axis: normal  Intervals: normal  ST/T Wave abnormalities: normal  Conduction Disutrbances:none  Narrative Interpretation: no stemi, RSR' in V1 or V2, no changes since EKG obtained 07/30/2012  Old EKG Reviewed: unchanged   1. Chest wall pain   2. Depression       MDM  62 y/o female with reproducible chest pain on palpation, sent over from [facility], [facility] earlier today for depression. 12 ER visits within the past 16 days. Ibuprofen given for pain, patient states this did not help. She says she needs money and help carrying her stuff to get over to [facility] and that will resolve her pain. No SI, HI. Vitals stable. Stable for discharge, advised her to go get evaluated at [facility].       Trevor Mace, PA-C 08/02/12 1658

## 2012-08-02 NOTE — ED Notes (Signed)
Case Management spoke with pt. Other resources provided and pt given discharge papers.

## 2012-08-02 NOTE — ED Notes (Signed)
Pt states she needs help for her nerves and depression.  No SI/HI.

## 2012-08-02 NOTE — ED Notes (Signed)
Patient has discharge orders to be given after breakfast

## 2012-08-02 NOTE — ED Provider Notes (Addendum)
History     CSN: [id number]  Arrival date & time 08/02/12  1111   First MD Initiated Contact with Patient 08/02/12 1123      No chief complaint on file.   (Consider location/radiation/quality/duration/timing/severity/associated sxs/prior treatment) HPI Comments: Pt seen by me yesterday.  Pt has been to [facility] a few days, ago, to the ED 5 times in the past 3 days for depression.  She is stating that she is so depressed she doesn't want to live, but when asked specifically, she is not suicidal, doesn't wish to actually die, has no plans.  She is depressed because she only gets disability, is not allowed to get a job or she loses disability, doesn't have a place to live, her family apparently chooses not to call her or really help her in any way.  She has a PCP.  She has been given referrals to outpt clinics, resources.  She was seen last night and seen by Berna Spare with ACT who gave her referrals to clinics, and given admission to [facility].  She is back again today asking for admission to a psych ward for a "few days."  She denies drinking or drugs.    The history is provided by the patient and medical records.    Past Medical History  Diagnosis Date  . Hypertension   . Depression   . Renal disorder kidney stones  . Back pain 05/30    history of back surgury and rods and pins    Past Surgical History  Procedure Laterality Date  . Abdominal hysterectomy    . Cholecystectomy    . Kidney stones    . Back surgery      Family History  Problem Relation Age of Onset  . Alzheimer's disease Mother   . Heart failure Mother   . Stroke Father   . Coronary artery disease Sister   . Coronary artery disease Sister   . Coronary artery disease Sister     History  Substance Use Topics  . Smoking status: Former Smoker -- 0.25 packs/day    Types: Cigarettes  . Smokeless tobacco: Never Used  . Alcohol Use: No    OB History   Grav Para Term Preterm Abortions TAB SAB Ect Mult  Living                  Review of Systems  Psychiatric/Behavioral: Positive for dysphoric mood. Negative for suicidal ideas and self-injury.  All other systems reviewed and are negative.    Allergies  Compazine; Aspirin; Dilaudid; Influenza vaccines; Metoclopramide hcl; and Zofran  Home Medications   Current Outpatient Rx  Name  Route  Sig  Dispense  Refill  . acetaminophen (TYLENOL) 500 MG tablet   Oral   Take 500 mg by mouth every 6 (six) hours as needed for pain.          Marland Kitchen ALPRAZolam (XANAX) 0.5 MG tablet   Oral   Take 1 tablet (0.5 mg total) by mouth at bedtime as needed for sleep.   12 tablet   0   . amLODipine (NORVASC) 5 MG tablet   Oral   Take 5 mg by mouth every morning.          . diclofenac sodium (VOLTAREN) 1 % GEL   Topical   Apply 2 g topically 4 (four) times daily. FOR BACK PAIN         . pantoprazole (PROTONIX) 40 MG tablet   Oral   Take 40  mg by mouth every morning.            BP 165/114  Pulse 120  Temp(Src) 98 F (36.7 C) (Oral)  Resp 20  SpO2 93%  Physical Exam  Nursing note and vitals reviewed. Constitutional: She appears well-developed and well-nourished.  HENT:  Head: Normocephalic and atraumatic.  Eyes: EOM are normal.  Neck: Normal range of motion. Neck supple.  Cardiovascular: Normal rate and intact distal pulses.   Pulmonary/Chest: Effort normal. No respiratory distress.  Abdominal: Soft.  Neurological: She is alert.  Skin: Skin is warm and dry. No rash noted. She is not diaphoretic.  Psychiatric: Thought content normal. She exhibits a depressed mood.  tearful    ED Course  Procedures (including critical care time)  Labs Reviewed - No data to display No results found.   1. Depression     I have re-itereated the same instructions as last night.  No emergent condition exists, does not meet inpt criteria.  Pt has been seen by social work in the recent past.      12:27 PM ACT and social work has seen pt  again today. MDM  No particular change.  I have reviewed prior labs, prior records, notes by [facility] and social work.          Gavin Pound. Oletta Lamas, MD 08/02/12 1143  Gavin Pound. Prudencio Velazco, MD 08/02/12 1227

## 2012-08-02 NOTE — ED Notes (Signed)
Called [facility]=- Pt does not meet criteria for placement and no rooms available for this pt-  [facility] will take pt home

## 2012-08-02 NOTE — ED Notes (Signed)
[facility] today with depression and chest wall pain and ordered to follow up with [facility] Pt arrived at [facility] and told the intake that she had chest pain. In route pt told ems she was just [facility] with these symptoms and that they were muscle spasms. EKG performed in rout with NSR.

## 2012-08-02 NOTE — BH Assessment (Signed)
Assessment Note   [name] is an 62 y.o. female.  Pt came to [facility] because she was tearful and depressed and wanted to cut herself.  Patient is currently staying with a man and his female friend.  Patient said that she is depressed but currently does not want to cut herself.  She denies any SI, HI.  Reports that she occasionally sees her dead father and sometimes hears her mother's voice (who is alive).  She said that this is not all the time and does not interfere with daily life.  Patient is able to return to her current living situation although she says that the man she is living with is jealous and will ask a lot of questions about where she has been.  Clinician told her that she can go to a homeless shelter if she needed to and gave her resource list.  Patient to be discharged.  Patient was also given names of psychiatrists in the area.  She said that she is going to get back in touch with [facility] for psychiatric medication monitoring.   Axis I: Mood Disorder NOS Axis II: Deferred Axis III:  Past Medical History  Diagnosis Date  . Hypertension   . Depression   . Renal disorder kidney stones  . Back pain 05/30    history of back surgury and rods and pins   Axis IV: economic problems, housing problems, occupational problems, problems related to social environment and problems with primary support group Axis V: 61-70 mild symptoms  Past Medical History:  Past Medical History  Diagnosis Date  . Hypertension   . Depression   . Renal disorder kidney stones  . Back pain 05/30    history of back surgury and rods and pins    Past Surgical History  Procedure Laterality Date  . Abdominal hysterectomy    . Cholecystectomy    . Kidney stones    . Back surgery      Family History:  Family History  Problem Relation Age of Onset  . Alzheimer's disease Mother   . Heart failure Mother   . Stroke Father   . Coronary artery disease Sister   . Coronary artery disease Sister   .  Coronary artery disease Sister     Social History:  reports that she has quit smoking. Her smoking use included Cigarettes. She smoked 0.25 packs per day. She has never used smokeless tobacco. She reports that she does not drink alcohol or use illicit drugs.  Additional Social History:  Alcohol / Drug Use Pain Medications: See PTA medication list Prescriptions: See PTA medication list Over the Counter: PtA medication list. History of alcohol / drug use?: No history of alcohol / drug abuse (Pt denies but is + for opiates, barbituates)  CIWA: CIWA-Ar BP: 140/94 mmHg Pulse Rate: 105 COWS:    Allergies:  Allergies  Allergen Reactions  . Compazine Rash    Gets spots on skin and has to get it flushed out of her system  . Aspirin Other (See Comments)    Reaction unknown  . Dilaudid (Hydromorphone Hcl) Other (See Comments)    Reaction unknown  . Influenza Vaccines Other (See Comments)    Got pneumonia, will not take vaccine  . Metoclopramide Hcl Hives  . Zofran Hives    Home Medications:  (Not in a hospital admission)  OB/GYN Status:  No LMP recorded. Patient has had a hysterectomy.  General Assessment Data Location of Assessment: [facility] ED ACT Assessment:  Yes Living Arrangements: Non-relatives/Friends Can pt return to current living arrangement?: Yes Admission Status: Voluntary Is patient capable of signing voluntary admission?: Yes Transfer from: Acute Hospital Referral Source: Self/Family/Friend     Risk to self Suicidal Ideation: No Suicidal Intent: No Is patient at risk for suicide?: No Suicidal Plan?: No Access to Means: No What has been your use of drugs/alcohol within the last 12 months?: Denies Previous Attempts/Gestures: Yes How many times?: 1 (One attempt 15 years ago) Other Self Harm Risks: None reported Triggers for Past Attempts: None known Intentional Self Injurious Behavior: None Family Suicide History: Unknown Recent stressful life event(s): Financial  Problems (No supports, may be homeless) Persecutory voices/beliefs?: No Depression: Yes Depression Symptoms: Fatigue;Feeling worthless/self pity;Loss of interest in usual pleasures Substance abuse history and/or treatment for substance abuse?: No Suicide prevention information given to non-admitted patients: Not applicable  Risk to Others Homicidal Ideation: No Thoughts of Harm to Others: No Current Homicidal Intent: No Current Homicidal Plan: No Access to Homicidal Means: No Identified Victim: No one History of harm to others?: No Assessment of Violence: None Noted Violent Behavior Description: None reported Does patient have access to weapons?: No Criminal Charges Pending?: No Does patient have a court date: No  Psychosis Hallucinations: Auditory (Hears mother's voice.  Baseline symptom.) Delusions: None noted  Mental Status Report Appear/Hygiene: Disheveled Eye Contact: Good Motor Activity: Freedom of movement;Unremarkable Speech: Logical/coherent Level of Consciousness: Alert Mood: Depressed;Helpless Affect: Depressed Anxiety Level: Moderate Thought Processes: Coherent;Relevant Judgement: Impaired Orientation: Person;Place;Situation Obsessive Compulsive Thoughts/Behaviors: None  Cognitive Functioning Concentration: Normal Memory: Recent Impaired;Remote Intact IQ: Average Insight: Fair Impulse Control: Good Appetite: Good Weight Loss: 0 Weight Gain: 0 Sleep: Decreased Total Hours of Sleep: 5 (Current pattern for last 6 months) Vegetative Symptoms: None  ADLScreening [facility] Assessment Services) Patient's cognitive ability adequate to safely complete daily activities?: Yes Patient able to express need for assistance with ADLs?: Yes Independently performs ADLs?: Yes (appropriate for developmental age)  Abuse/Neglect [facility]) Physical Abuse: Denies Verbal Abuse: Denies Sexual Abuse: Denies  Prior Inpatient Therapy Prior Inpatient Therapy: Yes Prior Therapy  Dates: 15 years ago [facility] Reason for Treatment: depression  Prior Outpatient Therapy Prior Outpatient Therapy: Yes Prior Therapy Dates: "years ago" Prior Therapy Facilty/Provider(s): Dr Dub Mikes Reason for Treatment: depression and anxiety  ADL Screening (condition at time of admission) Patient's cognitive ability adequate to safely complete daily activities?: Yes Patient able to express need for assistance with ADLs?: Yes Independently performs ADLs?: Yes (appropriate for developmental age) Weakness of Legs: None Weakness of Arms/Hands: None  Home Assistive Devices/Equipment Home Assistive Devices/Equipment: None    Abuse/Neglect Assessment (Assessment to be complete while patient is alone) Physical Abuse: Denies Verbal Abuse: Denies Sexual Abuse: Denies Exploitation of patient/patient's resources: Denies Self-Neglect: Denies Values / Beliefs Cultural Requests During Hospitalization: None Spiritual Requests During Hospitalization: None   Advance Directives (For Healthcare) Advance Directive: Patient does not have advance directive;Patient would not like information    Additional Information 1:1 In Past 12 Months?: No CIRT Risk: No Elopement Risk: No Does patient have medical clearance?: No     Disposition:  Disposition Initial Assessment Completed for this Encounter: Yes Disposition of Patient: Referred to [employer] housing, IOP) Type of outpatient treatment: Adult Patient referred to: Outpatient clinic referral  On Site Evaluation by:   Reviewed with Physician:  Dr. Fransico Michael Ray 08/02/2012 4:21 AM

## 2012-08-02 NOTE — Discharge Instructions (Signed)

## 2012-08-03 ENCOUNTER — Encounter (HOSPITAL_COMMUNITY): Payer: Self-pay | Admitting: Emergency Medicine

## 2012-08-03 ENCOUNTER — Emergency Department (HOSPITAL_COMMUNITY)
Admission: EM | Admit: 2012-08-03 | Discharge: 2012-08-03 | Disposition: A | Discharge status: Home / Self Care | Payer: Medicare Other | Attending: Emergency Medicine | Admitting: Emergency Medicine

## 2012-08-03 DIAGNOSIS — Z79899 Other long term (current) drug therapy: Secondary | ICD-10-CM | POA: Insufficient documentation

## 2012-08-03 DIAGNOSIS — Z87891 Personal history of nicotine dependence: Secondary | ICD-10-CM | POA: Insufficient documentation

## 2012-08-03 DIAGNOSIS — F3289 Other specified depressive episodes: Secondary | ICD-10-CM | POA: Insufficient documentation

## 2012-08-03 DIAGNOSIS — I10 Essential (primary) hypertension: Secondary | ICD-10-CM | POA: Insufficient documentation

## 2012-08-03 DIAGNOSIS — R110 Nausea: Secondary | ICD-10-CM | POA: Insufficient documentation

## 2012-08-03 DIAGNOSIS — R079 Chest pain, unspecified: Secondary | ICD-10-CM

## 2012-08-03 DIAGNOSIS — Z8739 Personal history of other diseases of the musculoskeletal system and connective tissue: Secondary | ICD-10-CM | POA: Insufficient documentation

## 2012-08-03 DIAGNOSIS — Z87448 Personal history of other diseases of urinary system: Secondary | ICD-10-CM | POA: Insufficient documentation

## 2012-08-03 DIAGNOSIS — R071 Chest pain on breathing: Secondary | ICD-10-CM | POA: Insufficient documentation

## 2012-08-03 DIAGNOSIS — Z87442 Personal history of urinary calculi: Secondary | ICD-10-CM | POA: Insufficient documentation

## 2012-08-03 DIAGNOSIS — F329 Major depressive disorder, single episode, unspecified: Secondary | ICD-10-CM | POA: Insufficient documentation

## 2012-08-03 LAB — CBC WITH DIFFERENTIAL/PLATELET
Basophils Relative: 0 % (ref 0–1)
Eosinophils Absolute: 0.2 10*3/uL (ref 0.0–0.7)
Hemoglobin: 12.9 g/dL (ref 12.0–15.0)
MCH: 31.4 pg (ref 26.0–34.0)
MCHC: 33.9 g/dL (ref 30.0–36.0)
Monocytes Relative: 12 % (ref 3–12)
Neutrophils Relative %: 55 % (ref 43–77)
Platelets: 208 10*3/uL (ref 150–400)

## 2012-08-03 LAB — URINE MICROSCOPIC-ADD ON

## 2012-08-03 LAB — BASIC METABOLIC PANEL
BUN: 19 mg/dL (ref 6–23)
Creatinine, Ser: 0.69 mg/dL (ref 0.50–1.10)
GFR calc Af Amer: >90 mL/min (ref >90)
GFR calc non Af Amer: >90 mL/min (ref >90)
Potassium: 4.1 mEq/L (ref 3.5–5.1)

## 2012-08-03 LAB — URINALYSIS, ROUTINE W REFLEX MICROSCOPIC
Bilirubin Urine: NEGATIVE
Ketones, ur: NEGATIVE mg/dL
Nitrite: NEGATIVE
Protein, ur: NEGATIVE mg/dL
Specific Gravity, Urine: 1.017 (ref 1.005–1.030)
Urobilinogen, UA: 1 mg/dL (ref 0.0–1.0)

## 2012-08-03 LAB — POCT I-STAT TROPONIN I
Troponin i, poc: 0 ng/mL (ref 0.00–0.08)
Troponin i, poc: 0.01 ng/mL (ref 0.00–0.08)

## 2012-08-03 LAB — RAPID URINE DRUG SCREEN, HOSP PERFORMED: Barbiturates: POSITIVE — AB

## 2012-08-03 MED ORDER — PROMETHAZINE HCL 25 MG PO TABS
25.0000 mg | ORAL_TABLET | ORAL | Status: AC
  Administered 2012-08-03: 25 mg via ORAL
  Filled 2012-08-03: qty 1

## 2012-08-03 MED ORDER — ONDANSETRON 4 MG PO TBDP: 4.0000 mg | ORAL_TABLET | Freq: Once | ORAL | Status: DC

## 2012-08-03 MED ORDER — ACETAMINOPHEN 325 MG PO TABS
650.0000 mg | ORAL_TABLET | Freq: Once | ORAL | Status: AC
  Administered 2012-08-03: 650 mg via ORAL
  Filled 2012-08-03: qty 2

## 2012-08-03 NOTE — ED Notes (Signed)
Patient called EMS in reference to chest discomfort.  According to EMS it started at 0845, she woke up and started feeling chest discomfort.  She does not have anywhere to stay, does not know where her medications are and right now she is staying with some friends.  The friends are poor historians as well as the patient.  EMS gave her 2 SL nitros and 324mg  of Aspirin.  The patient was transported to [facility] ED.

## 2012-08-03 NOTE — ED Notes (Signed)
Report called to Antonieta Iba, RN in [place] [facility]. Pt being prepared for transport.

## 2012-08-03 NOTE — H&P (Signed)
Behavioral Health Medical Screening Exam  [name] is an 62 y.o. female.  Review of Systems  Constitutional: Negative.  Negative for fever, chills, weight loss, malaise/fatigue and diaphoresis.  HENT: Negative for congestion and sore throat.   Eyes: Negative for blurred vision, double vision and photophobia.  Respiratory: Negative for cough, shortness of breath and wheezing.   Cardiovascular: Negative for chest pain, palpitations and PND.  Gastrointestinal: Negative for heartburn, nausea, vomiting, abdominal pain, diarrhea and constipation.  Musculoskeletal: Negative for myalgias, joint pain and falls.  Neurological: Negative for dizziness, tingling, tremors, sensory change, speech change, focal weakness, seizures, loss of consciousness, weakness and headaches.  Endo/Heme/Allergies: Negative for polydipsia. Does not bruise/bleed easily.  Psychiatric/Behavioral: Positive for depression. Negative for hallucinations and memory loss. The patient is nervous/anxious and has insomnia.     Physical Exam  Constitutional: She is oriented to person, place, and time. She appears well-developed and well-nourished.  HENT:  Head: Normocephalic and atraumatic.  Eyes: Conjunctivae and EOM are normal.  Neck: Normal range of motion. Neck supple. No tracheal deviation present. No thyromegaly present.  Cardiovascular: Normal rate, regular rhythm, normal heart sounds and intact distal pulses.  Exam reveals no gallop and no friction rub.   No murmur heard. Respiratory: Effort normal and breath sounds normal. No respiratory distress. She has no wheezes. She has no rales. She exhibits no tenderness.  GI: Soft. Bowel sounds are normal. She exhibits no distension and no mass. There is no tenderness. There is no rebound and no guarding.  Musculoskeletal: Normal range of motion. She exhibits no edema and no tenderness.  Neurological: She is alert and oriented to person, place, and time. She has normal reflexes.  She displays normal reflexes. No cranial nerve deficit. She exhibits normal muscle tone. Coordination normal.  Skin: Skin is warm and dry.  Psychiatric: She has a normal mood and affect. Her speech is normal and behavior is normal. Thought content normal. Cognition and memory are normal. She expresses impulsivity and inappropriate judgment.  Patient is anxious and asking for hospitalization because she had no place to stay.    Blood pressure 151/87, pulse 73, temperature 97.8 F (36.6 C), temperature source Oral, resp. rate 16, SpO2 98.00%.  Recommendations:  Based on my evaluation the patient does not appear to have an emergency medical condition. Rona Ravens. Arnie Clingenpeel RPAC 9:36 AM 08/03/2012

## 2012-08-03 NOTE — ED Notes (Signed)
Pt resting comfortably in bed, respirations even and unlabored. Pt appears in NAD.

## 2012-08-03 NOTE — ED Notes (Signed)
I advised [place], PA that we stuck her three times and could not get an IV.  He advised just get the labs drawn and do not worry about the IV.

## 2012-08-03 NOTE — Progress Notes (Signed)
Spoke with On call SW Verlon Au, She states, that she unable to provide cab voucher for patient to get to [employer] given [employer]  by ED nurse.

## 2012-08-03 NOTE — ED Provider Notes (Signed)
Medical screening examination/treatment/procedure(s) were performed by non-physician practitioner and as supervising physician I was immediately available for consultation/collaboration.  Raeford Razor, MD 08/03/12 1550

## 2012-08-03 NOTE — ED Notes (Signed)
Pt did not want to be discharged because she did not want to go back home. Pt states "I will go sleep in the woods before I go back there." Case Management was unable to get pt placement in a shelter tonight, but pt was given bus pass and a list of resources to follow up with in the morning and per Dr. Patria Mane pt could stay in lobby tonight until she can go to shelter tomorrow morning. Pt agreed to plan.

## 2012-08-03 NOTE — ED Notes (Signed)
Dr. Patria Mane into talk with pt. Pt. Given a list of resources for alternative housing.  Pt. Also given a bus pass.

## 2012-08-03 NOTE — ED Provider Notes (Signed)
History     CSN: [id number]  Arrival date & time 08/03/12  0865   First MD Initiated Contact with Patient 08/03/12 1000      Chief Complaint  Patient presents with  . Chest Pain    (Consider location/radiation/quality/duration/timing/severity/associated sxs/prior treatment) HPI Comments: Patient presents emergency department with chief complaint of chest pain. She has been seen 16 times in the past 6 months for similar complaints. He states that the chest pain began this morning. She states that it does not feel any different than her regular chest pain. She states the pain as 5/10. She states that she also feels nauseated. Additionally, she states that she feels depressed, and would like to be evaluated for depression. She asks if she can have some food.  The history is provided by the patient. No language interpreter was used.    Past Medical History  Diagnosis Date  . Hypertension   . Depression   . Renal disorder kidney stones  . Back pain 05/30    history of back surgury and rods and pins    Past Surgical History  Procedure Laterality Date  . Abdominal hysterectomy    . Cholecystectomy    . Kidney stones    . Back surgery      Family History  Problem Relation Age of Onset  . Alzheimer's disease Mother   . Heart failure Mother   . Stroke Father   . Coronary artery disease Sister   . Coronary artery disease Sister   . Coronary artery disease Sister     History  Substance Use Topics  . Smoking status: Former Smoker -- 0.25 packs/day    Types: Cigarettes  . Smokeless tobacco: Never Used  . Alcohol Use: No    OB History   Grav Para Term Preterm Abortions TAB SAB Ect Mult Living                  Review of Systems  All other systems reviewed and are negative.    Allergies  Compazine; Aspirin; Dilaudid; Influenza vaccines; Metoclopramide hcl; and Zofran  Home Medications   Current Outpatient Rx  Name  Route  Sig  Dispense  Refill  . acetaminophen  (TYLENOL) 500 MG tablet   Oral   Take 500 mg by mouth every 6 (six) hours as needed for pain.          Marland Kitchen ALPRAZolam (XANAX) 0.5 MG tablet   Oral   Take 1 tablet (0.5 mg total) by mouth at bedtime as needed for sleep.   12 tablet   0   . amLODipine (NORVASC) 5 MG tablet   Oral   Take 5 mg by mouth every morning.          . diclofenac sodium (VOLTAREN) 1 % GEL   Topical   Apply 2 g topically 4 (four) times daily. FOR BACK PAIN         . pantoprazole (PROTONIX) 40 MG tablet   Oral   Take 40 mg by mouth every morning.            BP 128/84  Pulse 74  Temp(Src) 98 F (36.7 C) (Oral)  Resp 14  SpO2 97%  Physical Exam  Nursing note and vitals reviewed. Constitutional: She is oriented to person, place, and time. She appears well-developed and well-nourished.  HENT:  Head: Normocephalic and atraumatic.  Eyes: Conjunctivae and EOM are normal. Pupils are equal, round, and reactive to light.  Neck: Normal range of  motion. Neck supple.  Cardiovascular: Normal rate and regular rhythm.  Exam reveals no gallop and no friction rub.   No murmur heard. Pulmonary/Chest: Effort normal and breath sounds normal. No respiratory distress. She has no wheezes. She has no rales. She exhibits no tenderness.  Abdominal: Soft. Bowel sounds are normal. She exhibits no distension and no mass. There is no tenderness. There is no rebound and no guarding.  Musculoskeletal: Normal range of motion. She exhibits no edema and no tenderness.  Neurological: She is alert and oriented to person, place, and time.  Skin: Skin is warm and dry.  Psychiatric: She has a normal mood and affect. Her behavior is normal. Judgment and thought content normal.    ED Course  Procedures (including critical care time)  Labs Reviewed  CBC WITH DIFFERENTIAL - Abnormal; Notable for the following:    WBC 3.7 (*)    All other components within normal limits  BASIC METABOLIC PANEL - Abnormal; Notable for the following:     Glucose, Bld 101 (*)    All other components within normal limits  URINALYSIS, ROUTINE W REFLEX MICROSCOPIC - Abnormal; Notable for the following:    Hgb urine dipstick TRACE (*)    All other components within normal limits  URINE RAPID DRUG SCREEN (HOSP PERFORMED) - Abnormal; Notable for the following:    Opiates POSITIVE (*)    Benzodiazepines POSITIVE (*)    Barbiturates POSITIVE (*)    All other components within normal limits  URINE MICROSCOPIC-ADD ON  POCT I-STAT TROPONIN I   Results for orders placed during the hospital encounter of 08/03/12  CBC WITH DIFFERENTIAL      Result Value Range   WBC 3.7 (*) 4.0 - 10.5 K/uL   RBC 4.11  3.87 - 5.11 MIL/uL   Hemoglobin 12.9  12.0 - 15.0 g/dL   HCT 16.1  09.6 - 04.5 %   MCV 92.7  78.0 - 100.0 fL   MCH 31.4  26.0 - 34.0 pg   MCHC 33.9  30.0 - 36.0 g/dL   RDW 40.9  81.1 - 91.4 %   Platelets 208  150 - 400 K/uL   Neutrophils Relative % 55  43 - 77 %   Neutro Abs 2.1  1.7 - 7.7 K/uL   Lymphocytes Relative 28  12 - 46 %   Lymphs Abs 1.0  0.7 - 4.0 K/uL   Monocytes Relative 12  3 - 12 %   Monocytes Absolute 0.5  0.1 - 1.0 K/uL   Eosinophils Relative 4  0 - 5 %   Eosinophils Absolute 0.2  0.0 - 0.7 K/uL   Basophils Relative 0  0 - 1 %   Basophils Absolute 0.0  0.0 - 0.1 K/uL  BASIC METABOLIC PANEL      Result Value Range   Sodium 140  135 - 145 mEq/L   Potassium 4.1  3.5 - 5.1 mEq/L   Chloride 100  96 - 112 mEq/L   CO2 27  19 - 32 mEq/L   Glucose, Bld 101 (*) 70 - 99 mg/dL   BUN 19  6 - 23 mg/dL   Creatinine, Ser 7.82  0.50 - 1.10 mg/dL   Calcium 9.7  8.4 - 95.6 mg/dL   GFR calc non Af Amer >90  >90 mL/min   GFR calc Af Amer >90  >90 mL/min  URINALYSIS, ROUTINE W REFLEX MICROSCOPIC      Result Value Range   Color, Urine YELLOW  YELLOW  APPearance CLEAR  CLEAR   Specific Gravity, Urine 1.017  1.005 - 1.030   pH 6.0  5.0 - 8.0   Glucose, UA NEGATIVE  NEGATIVE mg/dL   Hgb urine dipstick TRACE (*) NEGATIVE   Bilirubin  Urine NEGATIVE  NEGATIVE   Ketones, ur NEGATIVE  NEGATIVE mg/dL   Protein, ur NEGATIVE  NEGATIVE mg/dL   Urobilinogen, UA 1.0  0.0 - 1.0 mg/dL   Nitrite NEGATIVE  NEGATIVE   Leukocytes, UA NEGATIVE  NEGATIVE  URINE RAPID DRUG SCREEN (HOSP PERFORMED)      Result Value Range   Opiates POSITIVE (*) NONE DETECTED   Cocaine NONE DETECTED  NONE DETECTED   Benzodiazepines POSITIVE (*) NONE DETECTED   Amphetamines NONE DETECTED  NONE DETECTED   Tetrahydrocannabinol NONE DETECTED  NONE DETECTED   Barbiturates POSITIVE (*) NONE DETECTED  URINE MICROSCOPIC-ADD ON      Result Value Range   Squamous Epithelial / LPF RARE  RARE   WBC, UA 0-2  <3 WBC/hpf   RBC / HPF 0-2  <3 RBC/hpf   Bacteria, UA RARE  RARE  POCT I-STAT TROPONIN I      Result Value Range   Troponin i, poc 0.00  0.00 - 0.08 ng/mL   Comment 3           POCT I-STAT TROPONIN I      Result Value Range   Troponin i, poc 0.01  0.00 - 0.08 ng/mL   Comment 3            Dg Chest 2 View  07/30/2012   *RADIOLOGY REPORT*  Clinical Data: Left-sided chest pain, nausea, history of hypertension and COPD, smoker  CHEST - 2 VIEW  Comparison: 07/26/2012; 06/24/2012  Findings: Grossly unchanged cardiac silhouette and mediastinal contours.  The lungs remain hyperexpanded with flattening of bilateral hemidiaphragms mild diffuse thickening of the pulmonary tissue.  No focal airspace opacity.  No pleural effusion or pneumothorax.  Unchanged bones including sequela of long segment paraspinal fusion within the superior aspect of the thoracic spine, incompletely evaluated.  IMPRESSION: Hyperexpanded lungs are acute cardiopulmonary disease.   Original Report Authenticated By: Tacey Ruiz, MD   Dg Chest 2 View  07/26/2012   *RADIOLOGY REPORT*  Clinical Data: Chest pain and hypertension  CHEST - 2 VIEW  Comparison: Thoracic spine films 04/27 1014 the  Findings: Normal mediastinum and heart silhouette.  Posterior thoracic fusion noted.  No effusion, infiltrate,  or pneumothorax.  IMPRESSION: No acute cardiopulmonary process.   Original Report Authenticated By: Genevive Bi, M.D.   Ct Head Wo Contrast  07/17/2012   *RADIOLOGY REPORT*  Clinical Data: Sudden onset of headache and confusion, weakness, nausea and vomiting  CT HEAD WITHOUT CONTRAST  Technique:  Contiguous axial images were obtained from the base of the skull through the vertex without contrast.  Comparison: 11/01/2011; 06/18/2011; 03/11/2011  Findings:  Examination is minimally degraded secondary to obliquity.  Gray white differentiation is maintained.  No CT evidence of acute large territory infarct.  No intraparenchymal or extra-axial mass or hemorrhage.  Normal size and configuration of the ventricles and basilar cisterns.  No midline shift.  Limited visualization of the paranasal sinuses and mastoid air cells are normal.  Regional soft tissues are normal.  No displaced calvarial fracture.  Post bilateral cataract surgery.  IMPRESSION: Negative noncontrast head CT.   Original Report Authenticated By: Tacey Ruiz, MD      No diagnosis found.    MDM  Patient  with chest pain, which I believe was likely related to psychological issues and probably depression. She's been seen 16 times for the same type of chest pain/depression in the past 6 months. Her workups have always been negative. Her troponin today is negative. EKG is unchanged. Patient states that she feels nauseated, but then asks for food.  Other providers have had feelings of malingering from the patient, and I feel similar.  Will move the patient the the psych ed, and consult with ACT.  Will order delta troponin, which I suspect will be negative.  Very low suspicion for ACS.  Discussed with Dr. Ignacia Palma, who agrees with the plan.  Repeat troponin is negative.  Social work and ACT team to evaluate.         Roxy Horseman, PA-C 08/03/12 1616

## 2012-08-03 NOTE — ED Notes (Signed)
Pt. Arrived from [facility].  Alert and oriented X4, Having lt. Side chest pain. Off and on. Pt. Medically clear.   Pt. Has requested a lunch and a coke.  She has also would like to speak to our Act counselor.  She reports not like to live a home.  She stated, "They drink"  When asked how many people she lives with  She stated. "two men and women."  When asked if she is in any danger at home she states, "NO".  When asked if the make her do anything she does not want to do, she stated, "I have to clean the house, I don't  Like to."

## 2012-08-03 NOTE — Progress Notes (Signed)
   CARE MANAGEMENT ED NOTE 08/03/2012  Patient:  [name], [name]   Account Number:  [id number]  Date Initiated:  08/03/2012  Documentation initiated by:  Fransico Michael  Subjective/Objective Assessment:   presented to ED with c/o chest pain and nausea     Subjective/Objective Assessment Detail:     Action/Plan:   homeless ?   Action/Plan Detail:   Anticipated DC Date:  08/03/2012     Status Recommendation to Physician:   Result of Recommendation:      DC Planning Services  CM consult    Choice offered to / List presented to:            Status of service:  Completed, signed off  ED Comments:   ED Comments Detail:  08/03/12-1633-J.Coraima Tibbs,RN,BSN [id number]      Received referral from [facility], [place] to assist patient with living situation. States, " she lives with two guys, basically couch surfing and she doesn't feel safe there". Patient has been seen in ED 16 times in last 6 months. Has had multiple case management and social work consults for "assistance" with housing situation. Patient has received local shelter, [facility], and [facility] referrals on multiple visits and yet presents today with the same concern. In to speak with patient regarding previous visits and receipt of information. Patient states, " I just don't like where I live. Talked to patient about filling out application with the housing authority and [employer] bus transportation since, "my car ain't working right." CM spoke with Clydie Braun, ACT team member regarding possible community ACT team involvement. CM also gave patient community resource list and list of assisted living facilities in [place]. No further CM needs identified. Patient to be discharged home.

## 2012-08-03 NOTE — Progress Notes (Signed)
On Call SW notified. ED CM  Spoke with patient in regards to ACT team Community Support Team SW referral. Pt given contact number [phone] to schedule appt. Pt instructed to  f/u with PCP after discharge.

## 2012-08-03 NOTE — ED Notes (Signed)
Pt. Given a list of shelters in [place]. Pt. Tearful stating "I just can't go back to the place I was living at".
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
## 2012-08-03 NOTE — ED Notes (Signed)
Pt. Has been seen by Case Management .  Is waiting to be seen by our Act counselor

## 2012-08-03 NOTE — ED Provider Notes (Signed)
10:22 AM  Date: 08/03/2012  Rate: 87  Rhythm: normal sinus rhythm  QRS Axis: left  Intervals: normal  ST/T Wave abnormalities: normal  Conduction Disutrbances:none  Narrative Interpretation: Normal EKG  Old EKG Reviewed: unchanged    Carleene Cooper III, MD 08/03/12 1024

## 2012-08-03 NOTE — ED Provider Notes (Signed)
4:54 PM Patient's been seen in this emergency room 16 times in the past 6 months.  She is well-known to me.  She has depression and she continues to come emergency apartment for various complaints in the majority reason that she comes is because she does not like where she lives.  There is nothing at this time to suggest that she needs inpatient psychiatric treatment.  Will try and arrange an outpatient ACT social worker to come to the patient's house and checked on her several times a week.  Hopefully this will decrease her ER visits.  We'll try and to involve her primary care physician as well.  Patient has been seen and assisted by social work and case management in the emergency department.  Lyanne Co, MD 08/03/12 [phone]

## 2012-08-03 NOTE — ED Notes (Signed)
Pt. Very tearful. Does not want to go home.  Pt. Stated, "I will sleep in the woods, before I go home.  "

## 2012-08-03 NOTE — ED Notes (Signed)
Advbised

## 2012-08-04 ENCOUNTER — Emergency Department (HOSPITAL_COMMUNITY)
Admission: EM | Admit: 2012-08-04 | Discharge: 2012-08-04 | Disposition: A | Discharge status: Home / Self Care | Payer: Medicare Other | Source: Home / Self Care | Attending: Emergency Medicine | Admitting: Emergency Medicine

## 2012-08-04 ENCOUNTER — Encounter (HOSPITAL_COMMUNITY): Payer: Self-pay | Admitting: Emergency Medicine

## 2012-08-04 DIAGNOSIS — R071 Chest pain on breathing: Secondary | ICD-10-CM | POA: Insufficient documentation

## 2012-08-04 DIAGNOSIS — F329 Major depressive disorder, single episode, unspecified: Secondary | ICD-10-CM

## 2012-08-04 DIAGNOSIS — I10 Essential (primary) hypertension: Secondary | ICD-10-CM | POA: Insufficient documentation

## 2012-08-04 DIAGNOSIS — F22 Delusional disorders: Secondary | ICD-10-CM | POA: Insufficient documentation

## 2012-08-04 DIAGNOSIS — R079 Chest pain, unspecified: Secondary | ICD-10-CM

## 2012-08-04 DIAGNOSIS — Z9889 Other specified postprocedural states: Secondary | ICD-10-CM | POA: Insufficient documentation

## 2012-08-04 DIAGNOSIS — Z8739 Personal history of other diseases of the musculoskeletal system and connective tissue: Secondary | ICD-10-CM | POA: Insufficient documentation

## 2012-08-04 DIAGNOSIS — F410 Panic disorder [episodic paroxysmal anxiety] without agoraphobia: Secondary | ICD-10-CM

## 2012-08-04 DIAGNOSIS — F39 Unspecified mood [affective] disorder: Secondary | ICD-10-CM | POA: Insufficient documentation

## 2012-08-04 DIAGNOSIS — F3289 Other specified depressive episodes: Secondary | ICD-10-CM | POA: Insufficient documentation

## 2012-08-04 DIAGNOSIS — Z87891 Personal history of nicotine dependence: Secondary | ICD-10-CM | POA: Insufficient documentation

## 2012-08-04 DIAGNOSIS — Z87448 Personal history of other diseases of urinary system: Secondary | ICD-10-CM | POA: Insufficient documentation

## 2012-08-04 DIAGNOSIS — F411 Generalized anxiety disorder: Secondary | ICD-10-CM | POA: Insufficient documentation

## 2012-08-04 LAB — POCT I-STAT TROPONIN I: Troponin i, poc: 0.03 ng/mL (ref 0.00–0.08)

## 2012-08-04 MED ORDER — ACETAMINOPHEN 325 MG PO TABS
650.0000 mg | ORAL_TABLET | Freq: Once | ORAL | Status: AC
  Administered 2012-08-04: 650 mg via ORAL
  Filled 2012-08-04: qty 2

## 2012-08-04 MED ORDER — ACETAMINOPHEN 500 MG PO TABS
1000.0000 mg | ORAL_TABLET | Freq: Once | ORAL | Status: AC
  Administered 2012-08-04: 1000 mg via ORAL
  Filled 2012-08-04: qty 2

## 2012-08-04 NOTE — ED Provider Notes (Signed)
History     CSN: [id number]  Arrival date & time 08/04/12  1050   First MD Initiated Contact with Patient 08/04/12 1053      Chief Complaint  Patient presents with  . Anxiety    (Consider location/radiation/quality/duration/timing/severity/associated sxs/prior treatment) HPI Patient presents with one day from discharge after recent evaluation for chest pain, and now for the third time in 4 days. She states that since discharge she went to followup with her counselor.  During the conversation she developed left-sided chest pain radiating to her left arm.  There was no concurrent syncope, near syncope, vomiting, incontinence, weakness anywhere. She states that there was significant anxiety with the chest pain.  This has improved somewhat without clear intervention.  Patient is carrying sheath for papers including multiple recent discharge instructions, and instructions on obtaining care at local homeless shelters  Past Medical History  Diagnosis Date  . Hypertension   . Depression   . Renal disorder kidney stones  . Back pain 05/30    history of back surgury and rods and pins    Past Surgical History  Procedure Laterality Date  . Abdominal hysterectomy    . Cholecystectomy    . Kidney stones    . Back surgery      Family History  Problem Relation Age of Onset  . Alzheimer's disease Mother   . Heart failure Mother   . Stroke Father   . Coronary artery disease Sister   . Coronary artery disease Sister   . Coronary artery disease Sister     History  Substance Use Topics  . Smoking status: Former Smoker -- 0.25 packs/day    Types: Cigarettes  . Smokeless tobacco: Never Used  . Alcohol Use: No    OB History   Grav Para Term Preterm Abortions TAB SAB Ect Mult Living                  Review of Systems  Cardiovascular: Positive for chest pain.  Musculoskeletal:       Positive for chest wall pain.  Psychiatric/Behavioral: Positive for dysphoric mood. Negative  for suicidal ideas.  All other systems reviewed and are negative.    Allergies  Compazine; Aspirin; Dilaudid; Influenza vaccines; Metoclopramide hcl; and Zofran  Home Medications   Current Outpatient Rx  Name  Route  Sig  Dispense  Refill  . acetaminophen (TYLENOL) 500 MG tablet   Oral   Take 1,000 mg by mouth every 6 (six) hours as needed for pain (headache).           BP 162/99  Temp(Src) 98.3 F (36.8 C) (Oral)  Resp 15  SpO2 99%  Physical Exam  Nursing note and vitals reviewed. Constitutional: She appears well-developed and well-nourished. No distress.  HENT:  Head: Normocephalic and atraumatic.  Eyes: EOM are normal. No scleral icterus.  Neck: Normal range of motion. Neck supple.  Cardiovascular: Normal rate, regular rhythm and intact distal pulses.   No murmur heard. Pulmonary/Chest: Effort normal. No respiratory distress. She has no wheezes.  Abdominal: Soft. Bowel sounds are normal. She exhibits no distension. There is no tenderness. There is no rebound.  Neurological: She is alert. Coordination normal.  Skin: Skin is warm and dry. No rash noted. She is not diaphoretic.  Psychiatric: Her speech is normal and behavior is normal. Thought content is delusional. Cognition and memory are impaired. She exhibits a depressed mood.  Patient has little insight into her current condition    ED Course  Procedures (including critical care time)  Labs Reviewed - No data to display No results found.   No diagnosis found.  After the initial evaluation her.  The patient's chart, including 2 evaluations in the past few days, and imaging, including stress test was normal within the past 6 months, CT head which was normal within the past 2 weeks, x-ray which was normal within the past day, ECG which is nonischemic yesterday, serial troponins which were normal yesterday.   Date: 08/04/2012  Rate: 74  Rhythm: normal sinus rhythm  QRS Axis: normal  Intervals: normal  ST/T  Wave abnormalities: normal  Conduction Disutrbances:nonspecific intraventricular conduction delay  Narrative Interpretation:   Old EKG Reviewed: unchanged  borderline  MDM  This patient presents with concern chest pain and anxiety.  Notably, she is been seen 18 times in the past 6 months for these concerns, her previous evaluation includes normal stress test, normal chest x-ray, and serial troponins yesterday which were unremarkable.  She has been seen and evaluated both by physicians and psychiatrists multiple times.  She currently has in her possession resources to obtain care with shelters, psychiatrists, one of whom she was seeing just prior to my evaluation today.  With this thorough prior evaluation, the absence of any distress, stable vital signs, nonischemic ECG, there is low suspicion for significant acute new pathology.  She was discharged in stable condition to take advantage of the previously provided resources.        Gerhard Munch, MD 08/04/12 1131

## 2012-08-04 NOTE — ED Notes (Addendum)
Pt is alert and oriented to person, place, and event. Pt is aware of the month and year, but is unsure of what time it is today and what time she came to the ED. When asked what day it is, pt said, "It's not Sunday, so it has to be Monday." Pt states, "Everything feels funny to me" and "I don't know how I got here." Pt can't remember the last time she came to the hospital.

## 2012-08-04 NOTE — ED Notes (Addendum)
Pt dc'd home w/all belongings, alert and ambulatory upon dc, pt verbalizes understanding of dc instructions, no new medications prescribed, bus pass given
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
## 2012-08-04 NOTE — ED Notes (Addendum)
Per EMS-pt c/o of chest pain non specific. Anxiety. No pain at moment. Pt was at [facility] earlier this am.
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
## 2012-08-04 NOTE — ED Provider Notes (Signed)
Medical screening examination/treatment/procedure(s) were performed by non-physician practitioner and as supervising physician I was immediately available for consultation/collaboration.   Charo Philipp III, MD 08/04/12 0713 

## 2012-08-05 NOTE — ED Provider Notes (Signed)
History     CSN: [id number]  Arrival date & time 08/03/12  2337   First MD Initiated Contact with Patient 08/04/12 [id number]      Chief Complaint  Patient presents with  . Chest Pain    (Consider location/radiation/quality/duration/timing/severity/associated sxs/prior treatment) HPI 62 yo female presents to the ER with complaint of chest pain.  Pain is left upper chest and shoulder.  No sob, no nausea, no diaphoresis.  Pain started while sitting in waiting room.  Pt seen and discharged earlier this evening for anxiety, depression, and social issues.  Pt has been seen multiple times in the ER recently for psych issues and chest pain.  Pt reports she is having troubles with her memory, is unsure how she got here.  Pt remembers me from prior visit, knows she is in the [facility] ED, and knows she needs to f/u with [facility].  She is oriented x 3.    Past Medical History  Diagnosis Date  . Hypertension   . Depression   . Renal disorder kidney stones  . Back pain 05/30    history of back surgury and rods and pins    Past Surgical History  Procedure Laterality Date  . Abdominal hysterectomy    . Cholecystectomy    . Kidney stones    . Back surgery      Family History  Problem Relation Age of Onset  . Alzheimer's disease Mother   . Heart failure Mother   . Stroke Father   . Coronary artery disease Sister   . Coronary artery disease Sister   . Coronary artery disease Sister     History  Substance Use Topics  . Smoking status: Former Smoker -- 0.25 packs/day    Types: Cigarettes  . Smokeless tobacco: Never Used  . Alcohol Use: No    OB History   Grav Para Term Preterm Abortions TAB SAB Ect Mult Living                  Review of Systems  All other systems reviewed and are negative.    Allergies  Compazine; Aspirin; Dilaudid; Influenza vaccines; Metoclopramide hcl; and Zofran  Home Medications   Current Outpatient Rx  Name  Route  Sig  Dispense  Refill  . acetaminophen  (TYLENOL) 500 MG tablet   Oral   Take 1,000 mg by mouth every 6 (six) hours as needed for pain (headache).           BP 158/97  Pulse 74  Temp(Src) 98.3 F (36.8 C) (Oral)  Resp 18  SpO2 99%  Physical Exam  Nursing note and vitals reviewed. Constitutional: She is oriented to person, place, and time. She appears well-developed and well-nourished.  HENT:  Head: Normocephalic and atraumatic.  Nose: Nose normal.  Mouth/Throat: Oropharynx is clear and moist.  Eyes: Conjunctivae and EOM are normal. Pupils are equal, round, and reactive to light.  Neck: Normal range of motion. Neck supple. No JVD present. No tracheal deviation present. No thyromegaly present.  Cardiovascular: Normal rate, regular rhythm, normal heart sounds and intact distal pulses.  Exam reveals no gallop and no friction rub.   No murmur heard. Pulmonary/Chest: Effort normal and breath sounds normal. No stridor. No respiratory distress. She has no wheezes. She has no rales. She exhibits tenderness (mild ttp over left upper chest).  Abdominal: Soft. Bowel sounds are normal. She exhibits no distension and no mass. There is no tenderness. There is no rebound and no guarding.  Musculoskeletal: Normal range of motion. She exhibits tenderness (mild ttp with ROM of left shoulder). She exhibits no edema.  Lymphadenopathy:    She has no cervical adenopathy.  Neurological: She is alert and oriented to person, place, and time. A cranial nerve deficit is present. She exhibits normal muscle tone. Coordination normal.  Skin: Skin is warm and dry. No rash noted. No erythema. No pallor.  Psychiatric: She has a normal mood and affect. Her behavior is normal. Judgment and thought content normal.  Flat affect, tearful    ED Course  Procedures (including critical care time)  Labs Reviewed  POCT I-STAT TROPONIN I   No results found.   Date: 08/05/2012  Rate: 73  Rhythm: normal sinus rhythm  QRS Axis: normal  Intervals: normal   ST/T Wave abnormalities: normal  Conduction Disutrbances:none  Narrative Interpretation:   Old EKG Reviewed: unchanged   1. Chest pain   2. Depression       MDM  62 yo female with chest pain.  No signs of ischemia, movement and palpation of left shoulder/left upper chest reproduces pain.  Pt is requesting help moving her things as she is not happy where she is living now.  Pt again given resources.        Olivia Mackie, MD 08/05/12 1043

## 2012-08-06 ENCOUNTER — Emergency Department (HOSPITAL_COMMUNITY)
Admission: EM | Admit: 2012-08-06 | Discharge: 2012-08-06 | Disposition: A | Discharge status: Home / Self Care | Payer: Medicare Other | Attending: Emergency Medicine | Admitting: Emergency Medicine

## 2012-08-06 ENCOUNTER — Encounter (HOSPITAL_COMMUNITY): Payer: Self-pay | Admitting: Emergency Medicine

## 2012-08-06 DIAGNOSIS — I10 Essential (primary) hypertension: Secondary | ICD-10-CM | POA: Insufficient documentation

## 2012-08-06 DIAGNOSIS — Z87448 Personal history of other diseases of urinary system: Secondary | ICD-10-CM | POA: Insufficient documentation

## 2012-08-06 DIAGNOSIS — R0789 Other chest pain: Secondary | ICD-10-CM

## 2012-08-06 DIAGNOSIS — R071 Chest pain on breathing: Secondary | ICD-10-CM | POA: Insufficient documentation

## 2012-08-06 DIAGNOSIS — Z9889 Other specified postprocedural states: Secondary | ICD-10-CM | POA: Insufficient documentation

## 2012-08-06 DIAGNOSIS — Z8659 Personal history of other mental and behavioral disorders: Secondary | ICD-10-CM | POA: Insufficient documentation

## 2012-08-06 DIAGNOSIS — Z87891 Personal history of nicotine dependence: Secondary | ICD-10-CM | POA: Insufficient documentation

## 2012-08-06 NOTE — ED Notes (Signed)
[facility] presents with a 62 yo female from home with chronic CP with c/o radiation to left arm and back.  Denies N/V/D and no diaphoresis.  Pt. Has not had prescribed medication other than baby ASA and NTG.  Pt took baby ASA and 1 NTG prior to [facility] arrival/no relief and EMS was called.  [facility] gave 4 baby ASA and 2 NTG/pt sinus tachy on monitor. No recent illness/cough; however, c/o of being tired for days.

## 2012-08-06 NOTE — ED Provider Notes (Signed)
History     CSN: [id number]  Arrival date & time 08/06/12  1547   First MD Initiated Contact with Patient 08/06/12 1550      Chief Complaint  Patient presents with  . Chest Pain    (Consider location/radiation/quality/duration/timing/severity/associated sxs/prior treatment) HPI  62 year old female with psychiatric history presents complaining of chest pain. Patient reports while she was sitting watching TV she experiencing a sharp pain to her left chest which radiates down to her left arm. The pain worries her, which prompted her to call EMS to bring her to the ER. In the meantime she did take some aspirin and also nitroglycerin which brought no relief. No complaints of fever, productive cough, hemoptysis, nausea vomiting, diarrhea or abdominal pain. No complaint of leg swelling. This is patient's fifth visit within the past 5 days for chest pain and 20th time in the past 6 months for various complaints but primarily related to her living situations.  She has been seen and evaluated thoroughly through past visits.  She has been given adequate resources for outpt care.    Past Medical History  Diagnosis Date  . Hypertension   . Depression   . Renal disorder kidney stones  . Back pain 05/30    history of back surgury and rods and pins    Past Surgical History  Procedure Laterality Date  . Abdominal hysterectomy    . Cholecystectomy    . Kidney stones    . Back surgery      Family History  Problem Relation Age of Onset  . Alzheimer's disease Mother   . Heart failure Mother   . Stroke Father   . Coronary artery disease Sister   . Coronary artery disease Sister   . Coronary artery disease Sister     History  Substance Use Topics  . Smoking status: Former Smoker -- 0.25 packs/day    Types: Cigarettes  . Smokeless tobacco: Never Used  . Alcohol Use: No    OB History   Grav Para Term Preterm Abortions TAB SAB Ect Mult Living                  Review of Systems  All  other systems reviewed and are negative.    Allergies  Compazine; Aspirin; Dilaudid; Influenza vaccines; Metoclopramide hcl; and Zofran  Home Medications   Current Outpatient Rx  Name  Route  Sig  Dispense  Refill  . acetaminophen (TYLENOL) 500 MG tablet   Oral   Take 1,000 mg by mouth every 6 (six) hours as needed for pain (headache).           There were no vitals taken for this visit.  Physical Exam  Nursing note and vitals reviewed. Constitutional: She is oriented to person, place, and time. She appears well-developed and well-nourished. No distress.  Thin-appearing white female, Awake, alert, nontoxic appearance  HENT:  Head: Atraumatic.  Eyes: Conjunctivae are normal. Right eye exhibits no discharge. Left eye exhibits no discharge.  Neck: Neck supple.  Cardiovascular: Normal rate, regular rhythm and intact distal pulses.   Pulmonary/Chest: She is in respiratory distress. She exhibits no tenderness (Tenderness left chest wall palpation without crepitus, emphysema, or overlying skin changes.).  Abdominal: Soft. There is no tenderness. There is no rebound.  Musculoskeletal: She exhibits no edema and no tenderness.  ROM appears intact, no obvious focal weakness  Neurological: She is alert and oriented to person, place, and time.  Mental status and motor strength appears intact.  Able to answer all basic question appropriately.  Skin: No rash noted.  Psychiatric: She has a normal mood and affect.    ED Course  Procedures (including critical care time)   Date: 08/06/2012  Rate: 90  Rhythm: normal sinus rhythm  QRS Axis: normal  Intervals: normal  ST/T Wave abnormalities: nonspecific ST/T changes  Conduction Disutrbances:none  Narrative Interpretation:   Old EKG Reviewed: unchanged    4:09 PM Pt with recurrent chronic left chest wall pain.  Atypical for cardiac disease.  This is the 5th visits in the past 5 days for chest pain complaint.  Has had thorough  evaluation in the past.  She is afebrile, VSS, good oxygenation and no acute ECG changes.  I have reviewed prior charts and note that pt has negative cardiac evaluations each time she presented to the ER with CP complaint.  I do not think pt has acute ischemic changes today.  Pt also has adequate referral and resources given from prior visits. Pt is stable for discharge.  I recommend close f/u with her PCP, dr. Quintella Reichert.  Care discussed with attending.    Labs Reviewed - No data to display No results found.   1. Chest wall pain       MDM  BP 135/92  Pulse 92  Temp(Src) 98.7 F (37.1 C) (Oral)  Resp 18  SpO2 98%  I have reviewed nursing notes and vital signs. I reviewed available ER/hospitalization records thought the EMR         Fayrene Helper, [place] 08/06/12 1703

## 2012-08-07 NOTE — ED Provider Notes (Signed)
Medical screening examination/treatment/procedure(s) were performed by non-physician practitioner and as supervising physician I was immediately available for consultation/collaboration.   Gwyneth Sprout, MD 08/07/12 [id number]

## 2012-08-08 ENCOUNTER — Emergency Department (EMERGENCY_DEPARTMENT_HOSPITAL)
Admission: EM | Admit: 2012-08-08 | Discharge: 2012-08-09 | Disposition: A | Discharge status: Other Acute Inpatient Hospital | Payer: Medicare Other | Source: Home / Self Care | Attending: Emergency Medicine | Admitting: Emergency Medicine

## 2012-08-08 ENCOUNTER — Encounter (HOSPITAL_COMMUNITY): Payer: Self-pay | Admitting: Emergency Medicine

## 2012-08-08 DIAGNOSIS — R45851 Suicidal ideations: Secondary | ICD-10-CM

## 2012-08-08 DIAGNOSIS — R079 Chest pain, unspecified: Secondary | ICD-10-CM | POA: Insufficient documentation

## 2012-08-08 DIAGNOSIS — I10 Essential (primary) hypertension: Secondary | ICD-10-CM | POA: Insufficient documentation

## 2012-08-08 DIAGNOSIS — Z79899 Other long term (current) drug therapy: Secondary | ICD-10-CM | POA: Insufficient documentation

## 2012-08-08 DIAGNOSIS — M549 Dorsalgia, unspecified: Secondary | ICD-10-CM | POA: Insufficient documentation

## 2012-08-08 DIAGNOSIS — F411 Generalized anxiety disorder: Secondary | ICD-10-CM | POA: Insufficient documentation

## 2012-08-08 DIAGNOSIS — F329 Major depressive disorder, single episode, unspecified: Secondary | ICD-10-CM | POA: Diagnosis present

## 2012-08-08 DIAGNOSIS — F3289 Other specified depressive episodes: Secondary | ICD-10-CM | POA: Insufficient documentation

## 2012-08-08 DIAGNOSIS — F17200 Nicotine dependence, unspecified, uncomplicated: Secondary | ICD-10-CM | POA: Insufficient documentation

## 2012-08-08 DIAGNOSIS — G8929 Other chronic pain: Secondary | ICD-10-CM | POA: Insufficient documentation

## 2012-08-08 DIAGNOSIS — Z590 Homelessness: Secondary | ICD-10-CM

## 2012-08-08 DIAGNOSIS — Z7982 Long term (current) use of aspirin: Secondary | ICD-10-CM | POA: Insufficient documentation

## 2012-08-08 DIAGNOSIS — Z87442 Personal history of urinary calculi: Secondary | ICD-10-CM | POA: Insufficient documentation

## 2012-08-08 LAB — COMPREHENSIVE METABOLIC PANEL WITH GFR
ALT: 25 U/L (ref 0–35)
AST: 29 U/L (ref 0–37)
Albumin: 3.8 g/dL (ref 3.5–5.2)
Alkaline Phosphatase: 66 U/L (ref 39–117)
BUN: 24 mg/dL — ABNORMAL HIGH (ref 6–23)
CO2: 27 meq/L (ref 19–32)
Calcium: 9.4 mg/dL (ref 8.4–10.5)
Chloride: 103 meq/L (ref 96–112)
Creatinine, Ser: 0.77 mg/dL (ref 0.50–1.10)
GFR calc Af Amer: >90 mL/min
GFR calc non Af Amer: 89 mL/min — ABNORMAL LOW
Glucose, Bld: 99 mg/dL (ref 70–99)
Potassium: 3.9 meq/L (ref 3.5–5.1)
Sodium: 140 meq/L (ref 135–145)
Total Bilirubin: 0.2 mg/dL — ABNORMAL LOW (ref 0.3–1.2)
Total Protein: 7.1 g/dL (ref 6.0–8.3)

## 2012-08-08 LAB — CBC
HCT: 37.3 % (ref 36.0–46.0)
Hemoglobin: 12.8 g/dL (ref 12.0–15.0)
MCH: 31.1 pg (ref 26.0–34.0)
MCHC: 34.3 g/dL (ref 30.0–36.0)
MCV: 90.8 fL (ref 78.0–100.0)
Platelets: 230 10*3/uL (ref 150–400)
RBC: 4.11 MIL/uL (ref 3.87–5.11)
RDW: 12.6 % (ref 11.5–15.5)
WBC: 5.5 10*3/uL (ref 4.0–10.5)

## 2012-08-08 LAB — RAPID URINE DRUG SCREEN, HOSP PERFORMED: Opiates: NOT DETECTED

## 2012-08-08 MED ORDER — LORAZEPAM 1 MG PO TABS
1.0000 mg | ORAL_TABLET | Freq: Four times a day (QID) | ORAL | Status: DC | PRN
  Administered 2012-08-08 – 2012-08-09 (×2): 1 mg via ORAL
  Filled 2012-08-08 (×2): qty 1

## 2012-08-08 MED ORDER — ASPIRIN 81 MG PO CHEW
81.0000 mg | CHEWABLE_TABLET | Freq: Every day | ORAL | Status: DC
  Administered 2012-08-08 – 2012-08-09 (×2): 81 mg via ORAL
  Filled 2012-08-08 (×2): qty 1

## 2012-08-08 NOTE — ED Notes (Signed)
Present to psych ED with c/o depression and SI without plan.  Pt denies SI at this time.  Pt reports feeling anxious.

## 2012-08-08 NOTE — ED Notes (Signed)
YNW:[id number]<OZ> Expected date:08/08/12<BR> Expected time: 1:27 PM<BR> Means of arrival:Ambulance<BR> Comments:<BR> Med Clearance vs anxiety

## 2012-08-08 NOTE — ED Notes (Signed)
Unable to obtain labs will call lab

## 2012-08-08 NOTE — ED Notes (Addendum)
Pt here via ems for c/o anxiety/depression/SI denies a plan."i want to be admitted to [facility] for a few days","I just want to die. My chest hurts all the time

## 2012-08-08 NOTE — ED Provider Notes (Signed)
History     CSN: [id number]  Arrival date & time 08/08/12  1327   First MD Initiated Contact with Patient 08/08/12 1456      Chief Complaint  Patient presents with  . Anxiety  . Depression    (Consider location/radiation/quality/duration/timing/severity/associated sxs/prior treatment) HPI Comments: Patient presents to the ER for evaluation of severe depression. Patient reports that she is very depressed over the fact that she cannot get to see her father's grave tomorrow and it is father's day. Patient is tearful about her distraught. She reports "I don't want to live anymore".  The patient's reason for presenting today for depression and suicidality. She does, however, noted that she has chest pain. Patient reports that she has chronic chest pain every day and has presented multiple times for this recently. There is nothing changed about the pain and she is expressing today in comparison to her chronic pain.  Patient is a 62 y.o. female presenting with anxiety.  Anxiety Pertinent negatives include no shortness of breath.    Past Medical History  Diagnosis Date  . Hypertension   . Depression   . Renal disorder kidney stones  . Back pain 05/30    history of back surgury and rods and pins    Past Surgical History  Procedure Laterality Date  . Abdominal hysterectomy    . Cholecystectomy    . Kidney stones    . Back surgery      Family History  Problem Relation Age of Onset  . Alzheimer's disease Mother   . Heart failure Mother   . Stroke Father   . Coronary artery disease Sister   . Coronary artery disease Sister   . Coronary artery disease Sister     History  Substance Use Topics  . Smoking status: Current Every Day Smoker -- 0.25 packs/day    Types: Cigarettes  . Smokeless tobacco: Never Used  . Alcohol Use: No    OB History   Grav Para Term Preterm Abortions TAB SAB Ect Mult Living                  Review of Systems  Constitutional: Negative for  fever.  Respiratory: Negative for shortness of breath.   Psychiatric/Behavioral: Positive for dysphoric mood. The patient is nervous/anxious.   All other systems reviewed and are negative.    Allergies  Compazine; Aspirin; Dilaudid; Influenza vaccines; Metoclopramide hcl; and Zofran  Home Medications   Current Outpatient Rx  Name  Route  Sig  Dispense  Refill  . acetaminophen (TYLENOL) 500 MG tablet   Oral   Take 1,000 mg by mouth every 6 (six) hours as needed for pain (headache).         Marland Kitchen aspirin 81 MG chewable tablet   Oral   Chew 81 mg by mouth daily.         Marland Kitchen ALPRAZolam (XANAX) 0.5 MG tablet   Oral   Take 0.5 mg by mouth at bedtime as needed for sleep.           BP 151/94  Pulse 89  Temp(Src) 98.5 F (36.9 C) (Oral)  Resp 20  SpO2 98%  Physical Exam  Constitutional: She is oriented to person, place, and time. She appears well-developed and well-nourished. No distress.  HENT:  Head: Normocephalic and atraumatic.  Right Ear: Hearing normal.  Left Ear: Hearing normal.  Nose: Nose normal.  Mouth/Throat: Oropharynx is clear and moist and mucous membranes are normal.  Eyes: Conjunctivae and EOM  are normal. Pupils are equal, round, and reactive to light.  Neck: Normal range of motion. Neck supple.  Cardiovascular: Regular rhythm, S1 normal and S2 normal.  Exam reveals no gallop and no friction rub.   No murmur heard. Pulmonary/Chest: Effort normal and breath sounds normal. No respiratory distress. She exhibits no tenderness.  Abdominal: Soft. Normal appearance and bowel sounds are normal. There is no hepatosplenomegaly. There is no tenderness. There is no rebound, no guarding, no tenderness at McBurney's point and negative Murphy's sign. No hernia.  Musculoskeletal: Normal range of motion.  Neurological: She is alert and oriented to person, place, and time. She has normal strength. No cranial nerve deficit or sensory deficit. Coordination normal. GCS eye  subscore is 4. GCS verbal subscore is 5. GCS motor subscore is 6.  Skin: Skin is warm, dry and intact. No rash noted. No cyanosis.  Psychiatric: Her behavior is normal. Her mood appears anxious. Her speech is rapid and/or pressured. She exhibits a depressed mood. She expresses suicidal ideation.    ED Course  Procedures (including critical care time)  EKG:  Date: 08/08/2012  Rate: 75  Rhythm: normal sinus rhythm  QRS Axis: left  Intervals: normal  ST/T Wave abnormalities: normal  Conduction Disutrbances:none  Narrative Interpretation:   Old EKG Reviewed: unchanged    Labs Reviewed  COMPREHENSIVE METABOLIC PANEL - Abnormal; Notable for the following:    BUN 24 (*)    Total Bilirubin 0.2 (*)    GFR calc non Af Amer 89 (*)    All other components within normal limits  CBC  TROPONIN I  URINE RAPID DRUG SCREEN (HOSP PERFORMED)   No results found.   Diagnosis: Depression    MDM  Patient comes to the ER for evaluation of severe depression. Patient reports that she wants to die. She is extremely distraught, cannot focus long enough to answer whether or not she has an active plan. Patient also indicates chest pain, but reports that she has chronic pain in the chest. Reviewing the chart reveals that the patient has been seen 10 other times for chest pain at this ER. Her workup today shows EKG without any changes, normal troponin. It is felt that the pain is secondary to her anxiety and she can be medically cleared for psychiatric treatment.       Gilda Crease, MD 08/08/12 1924

## 2012-08-08 NOTE — BH Assessment (Signed)
Assessment Note   [name] is an 62 y.o. female. Pt presents voluntarily to [facility] with depression and fleeting SI. However, at time of assessment pt denies SI and states that she came to [facility] b/c she will soon lose her housing. Pt says she currently lives with two other friends and that the friend who rents the apartment will be evicted next week. Pt denies HI and denies [facility]. No delusions noted. Pt does endorse suicide attempt 15 years ago. She says she was inpatient at [facility] 15 yrs ago. Per chart review, Eino Farber RN at [facility] arranged on 07/31/12 for pt to begin [facility] psych IOP on 08/07/12. When writer asked why she didn't attend first session on 6/6, pt says, "I don't remember that I was supposed to go".  Pt describes "real depressed" mood. Affect is mood congruent. Pt endorses fatigue, loss of interest in usual pleasures and crying spells. She denies substance use and says her roommates use alcohol and drugs frequently which makes her uncomfortable. Current stressor is her imminent homelessness. She says she tried going to shelters including [facility] but the shelters were full.  Pt will be discharged with resources as she doesn't meet inpatient criteria.   Axis I: Depressive Disorder NOS Axis II: Deferred Axis III:  Past Medical History  Diagnosis Date  . Hypertension   . Depression   . Renal disorder kidney stones  . Back pain 05/30    history of back surgury and rods and pins   Axis IV: housing problems, other psychosocial or environmental problems, problems related to social environment and problems with primary support group Axis V: 51-60 moderate symptoms  Past Medical History:  Past Medical History  Diagnosis Date  . Hypertension   . Depression   . Renal disorder kidney stones  . Back pain 05/30    history of back surgury and rods and pins    Past Surgical History  Procedure Laterality Date  . Abdominal hysterectomy    . Cholecystectomy    . Kidney  stones    . Back surgery      Family History:  Family History  Problem Relation Age of Onset  . Alzheimer's disease Mother   . Heart failure Mother   . Stroke Father   . Coronary artery disease Sister   . Coronary artery disease Sister   . Coronary artery disease Sister     Social History:  reports that she has been smoking Cigarettes.  She has been smoking about 0.25 packs per day. She has never used smokeless tobacco. She reports that she does not drink alcohol or use illicit drugs.  Additional Social History:  Alcohol / Drug Use Pain Medications: see PTA meds list Prescriptions: see PTA meds list Over the Counter: see PTA meds list History of alcohol / drug use?: No history of alcohol / drug abuse  CIWA: CIWA-Ar BP: 148/90 mmHg Pulse Rate: 89 COWS:    Allergies:  Allergies  Allergen Reactions  . Compazine Rash    Doesn't recall reaction  . Aspirin Other (See Comments)    Doesn't recall reaction; pt is currently taking 81mg   . Dilaudid (Hydromorphone Hcl) Hives    Sometimes tolerates  . Influenza Vaccines Other (See Comments)    Got pneumonia, will not take vaccine  . Metoclopramide Hcl Hives  . Zofran Hives    Home Medications:  (Not in a hospital admission)  OB/GYN Status:  No LMP recorded. Patient has had a hysterectomy.  General Assessment Data Location of Assessment: [facility] ED Living Arrangements: Non-relatives/Friends Can pt return to current living arrangement?: Yes Admission Status: Voluntary Is patient capable of signing voluntary admission?: Yes Transfer from: Acute Hospital Referral Source: Self/Family/Friend  Education Status Is patient currently in school?: No Current Grade: na Highest grade of school patient has completed: 14  Risk to self Suicidal Ideation: No Suicidal Intent: No Is patient at risk for suicide?: No Suicidal Plan?: No Access to Means: No What has been your use of drugs/alcohol within the last 12 months?: none Previous  Attempts/Gestures: Yes How many times?: 1 (15 years ago) Other Self Harm Risks: none Triggers for Past Attempts: None known Intentional Self Injurious Behavior: None Family Suicide History: Unknown Recent stressful life event(s): Other (Comment) (imminent homelessness) Persecutory voices/beliefs?: No Depression: Yes Depression Symptoms: Loss of interest in usual pleasures;Fatigue;Tearfulness Substance abuse history and/or treatment for substance abuse?: No Suicide prevention information given to non-admitted patients: Not applicable  Risk to Others Homicidal Ideation: No Thoughts of Harm to Others: No Current Homicidal Intent: No Current Homicidal Plan: No Access to Homicidal Means: No Identified Victim: none History of harm to others?: No Assessment of Violence: None Noted Violent Behavior Description: pt calm and cooperative Does patient have access to weapons?: No Criminal Charges Pending?: No Does patient have a court date: No  Psychosis Hallucinations: None noted Delusions: None noted  Mental Status Report Appear/Hygiene: Disheveled Eye Contact: Good Motor Activity: Freedom of movement Speech: Logical/coherent Level of Consciousness: Alert Mood: Depressed;Sad Affect: Depressed Anxiety Level: None Thought Processes: Coherent;Relevant Judgement: Unimpaired Orientation: Situation;Time;Place;Person Obsessive Compulsive Thoughts/Behaviors: None  Cognitive Functioning Concentration: Normal Memory: Remote Intact;Recent Intact IQ: Average Insight: Fair Impulse Control: Fair Appetite: Good Weight Loss: 0 Weight Gain: 0 Sleep: No Change Total Hours of Sleep: 8 Vegetative Symptoms: None  ADLScreening [facility] Assessment Services) Patient's cognitive ability adequate to safely complete daily activities?: Yes Patient able to express need for assistance with ADLs?: Yes Independently performs ADLs?: Yes (appropriate for developmental age)  Abuse/Neglect  [facility]) Physical Abuse: Denies Verbal Abuse: Denies Sexual Abuse: Denies  Prior Inpatient Therapy Prior Inpatient Therapy: Yes Prior Therapy Dates: 15 years ago Prior Therapy Facilty/Provider(s): [facility] Reason for Treatment: depression  Prior Outpatient Therapy Prior Outpatient Therapy: Yes Prior Therapy Dates: several years ago Prior Therapy Facilty/Provider(s): Dr Dub Mikes Reason for Treatment: depression and anxiety  ADL Screening (condition at time of admission) Patient's cognitive ability adequate to safely complete daily activities?: Yes Patient able to express need for assistance with ADLs?: Yes Independently performs ADLs?: Yes (appropriate for developmental age) Weakness of Legs: None  Home Assistive Devices/Equipment Home Assistive Devices/Equipment: None    Abuse/Neglect Assessment (Assessment to be complete while patient is alone) Physical Abuse: Denies Verbal Abuse: Denies Sexual Abuse: Denies Exploitation of patient/patient's resources: Denies Self-Neglect: Denies Values / Beliefs Cultural Requests During Hospitalization: None Spiritual Requests During Hospitalization: None   Advance Directives (For Healthcare) Advance Directive: Patient does not have advance directive    Additional Information 1:1 In Past 12 Months?: No CIRT Risk: No Elopement Risk: No Does patient have medical clearance?: Yes     Disposition:  Disposition Initial Assessment Completed for this Encounter: Yes Disposition of Patient: Referred to Patient referred to: Other (Comment) (psych IOP, outpatient resources & homeless info to be given)  On Site Evaluation by:   Reviewed with Physician:     Donnamarie Rossetti P 08/08/2012 11:52 PM

## 2012-08-09 ENCOUNTER — Encounter (HOSPITAL_COMMUNITY): Payer: Self-pay | Admitting: *Deleted

## 2012-08-09 ENCOUNTER — Inpatient Hospital Stay (HOSPITAL_COMMUNITY)
Admission: AD | Admit: 2012-08-09 | Discharge: 2012-08-14 | DRG: 885 | Payer: Medicare Other | Source: Intra-hospital | Attending: Psychiatry | Admitting: Psychiatry

## 2012-08-09 ENCOUNTER — Encounter (HOSPITAL_COMMUNITY): Payer: Self-pay | Admitting: Registered Nurse

## 2012-08-09 DIAGNOSIS — Z720 Tobacco use: Secondary | ICD-10-CM

## 2012-08-09 DIAGNOSIS — F329 Major depressive disorder, single episode, unspecified: Principal | ICD-10-CM | POA: Diagnosis present

## 2012-08-09 DIAGNOSIS — R0789 Other chest pain: Secondary | ICD-10-CM

## 2012-08-09 DIAGNOSIS — R45851 Suicidal ideations: Secondary | ICD-10-CM

## 2012-08-09 DIAGNOSIS — G8929 Other chronic pain: Secondary | ICD-10-CM

## 2012-08-09 DIAGNOSIS — R911 Solitary pulmonary nodule: Secondary | ICD-10-CM

## 2012-08-09 DIAGNOSIS — R079 Chest pain, unspecified: Secondary | ICD-10-CM

## 2012-08-09 DIAGNOSIS — F411 Generalized anxiety disorder: Secondary | ICD-10-CM | POA: Diagnosis present

## 2012-08-09 DIAGNOSIS — I10 Essential (primary) hypertension: Secondary | ICD-10-CM | POA: Diagnosis present

## 2012-08-09 DIAGNOSIS — N289 Disorder of kidney and ureter, unspecified: Secondary | ICD-10-CM | POA: Diagnosis present

## 2012-08-09 DIAGNOSIS — Z79899 Other long term (current) drug therapy: Secondary | ICD-10-CM

## 2012-08-09 DIAGNOSIS — G4700 Insomnia, unspecified: Secondary | ICD-10-CM

## 2012-08-09 DIAGNOSIS — R197 Diarrhea, unspecified: Secondary | ICD-10-CM

## 2012-08-09 MED ORDER — ACETAMINOPHEN 325 MG PO TABS
650.0000 mg | ORAL_TABLET | Freq: Four times a day (QID) | ORAL | Status: DC | PRN
  Administered 2012-08-10 – 2012-08-12 (×2): 650 mg via ORAL

## 2012-08-09 MED ORDER — MAGNESIUM HYDROXIDE 400 MG/5ML PO SUSP: 30.0000 mL | Freq: Every day | ORAL | Status: DC | PRN

## 2012-08-09 MED ORDER — ASPIRIN 81 MG PO CHEW
81.0000 mg | CHEWABLE_TABLET | Freq: Every day | ORAL | Status: DC
  Administered 2012-08-10 – 2012-08-14 (×5): 81 mg via ORAL
  Filled 2012-08-09 (×8): qty 1

## 2012-08-09 NOTE — BH Assessment (Signed)
[facility] Assessment Progress Note     Pt accepted to [facility] and can be transported after 1930 by security.  Incoming ACT will complete necessary paper work in case something changes and admit needs to be delayed.    Pt going to room 501 bed 2 to the services of Dr. Elsie Saas and was accepted by Alvy Beal NP

## 2012-08-09 NOTE — ED Notes (Signed)
Checking in on patient asking her if resource info for homeless shelters was given to her last night. Stated no, then began crying stating she will hurt herself if she is discharged. States she has access to knives and she will cut her wrists. Will notify MD.

## 2012-08-09 NOTE — Progress Notes (Signed)
62 year old female pt admitted on voluntary basis. Pt reports feelings of extreme depression and the reason for this depression is because she may be losing her housing in the next week or so. Pt spoke about living with a gentleman who is close to being evicted. She then went on to say that she would really like to live with her mother but there are 7 people living in a 3 bedroom trailer. Pt denies any alcohol or drug use. Pt states that she has a PCP but does not see them on a regular basis. Pt states that she would like some medication for depression and also help with finding a new residence and spoke about possibly going to assisted living. Pt states that she is on disability due to her back problems. Pt denies SI and is able to contract for safety on the unit. Pt was oriented and safety maintained.

## 2012-08-09 NOTE — Consult Note (Signed)
Reason for Consult:  Evaluation for inpatient services Referring Physician: EDP  [name] is an 62 y.o. female.  HPI: Patient presented to the ED with complaints of increased depression and crying.  Patient states that her mother lives with her sister and she is not allowed to see her because her mother gets up set every time she leaves.  States that her mother is very sick and hates that she cant see her.  Patient also states that she is living in a house with 2 "drunks" because she has no where else to live and nobody else wants her.  Patient is unable to contract for safety.    Past Medical History  Diagnosis Date  . Hypertension   . Depression   . Renal disorder kidney stones  . Back pain 05/30    history of back surgury and rods and pins    Past Surgical History  Procedure Laterality Date  . Abdominal hysterectomy    . Cholecystectomy    . Kidney stones    . Back surgery      Family History  Problem Relation Age of Onset  . Alzheimer's disease Mother   . Heart failure Mother   . Stroke Father   . Coronary artery disease Sister   . Coronary artery disease Sister   . Coronary artery disease Sister     Social History:  reports that she has been smoking Cigarettes.  She has been smoking about 0.25 packs per day. She has never used smokeless tobacco. She reports that she does not drink alcohol or use illicit drugs.  Allergies:  Allergies  Allergen Reactions  . Compazine Rash    Doesn't recall reaction  . Aspirin Other (See Comments)    Doesn't recall reaction; pt is currently taking 81mg   . Dilaudid (Hydromorphone Hcl) Hives    Sometimes tolerates  . Influenza Vaccines Other (See Comments)    Got pneumonia, will not take vaccine  . Metoclopramide Hcl Hives  . Zofran Hives    Medications: I have reviewed the patient's current medications.  Results for orders placed during the hospital encounter of 08/08/12 (from the past 48 hour(s))  CBC     Status: None   Collection Time    08/08/12  5:27 PM      Result Value Range   WBC 5.5  4.0 - 10.5 K/uL   RBC 4.11  3.87 - 5.11 MIL/uL   Hemoglobin 12.8  12.0 - 15.0 g/dL   HCT 16.1  09.6 - 04.5 %   MCV 90.8  78.0 - 100.0 fL   MCH 31.1  26.0 - 34.0 pg   MCHC 34.3  30.0 - 36.0 g/dL   RDW 40.9  81.1 - 91.4 %   Platelets 230  150 - 400 K/uL  COMPREHENSIVE METABOLIC PANEL     Status: Abnormal   Collection Time    08/08/12  5:27 PM      Result Value Range   Sodium 140  135 - 145 mEq/L   Potassium 3.9  3.5 - 5.1 mEq/L   Chloride 103  96 - 112 mEq/L   CO2 27  19 - 32 mEq/L   Glucose, Bld 99  70 - 99 mg/dL   BUN 24 (*) 6 - 23 mg/dL   Creatinine, Ser 7.82  0.50 - 1.10 mg/dL   Calcium 9.4  8.4 - 95.6 mg/dL   Total Protein 7.1  6.0 - 8.3 g/dL   Albumin 3.8  3.5 - 5.2  g/dL   AST 29  0 - 37 U/L   ALT 25  0 - 35 U/L   Alkaline Phosphatase 66  39 - 117 U/L   Total Bilirubin 0.2 (*) 0.3 - 1.2 mg/dL   GFR calc non Af Amer 89 (*) >90 mL/min   GFR calc Af Amer >90  >90 mL/min   Comment:            The eGFR has been calculated     using the CKD EPI equation.     This calculation has not been     validated in all clinical     situations.     eGFR's persistently     <90 mL/min signify     possible Chronic Kidney Disease.  TROPONIN I     Status: None   Collection Time    08/08/12  5:27 PM      Result Value Range   Troponin I <0.30  <0.30 ng/mL   Comment:            Due to the release kinetics of cTnI,     a negative result within the first hours     of the onset of symptoms does not rule out     myocardial infarction with certainty.     If myocardial infarction is still suspected,     repeat the test at appropriate intervals.  URINE RAPID DRUG SCREEN (HOSP PERFORMED)     Status: Abnormal   Collection Time    08/08/12  7:05 PM      Result Value Range   Opiates NONE DETECTED  NONE DETECTED   Cocaine NONE DETECTED  NONE DETECTED   Benzodiazepines POSITIVE (*) NONE DETECTED   Amphetamines NONE  DETECTED  NONE DETECTED   Tetrahydrocannabinol NONE DETECTED  NONE DETECTED   Barbiturates POSITIVE (*) NONE DETECTED   Comment:            DRUG SCREEN FOR MEDICAL PURPOSES     ONLY.  IF CONFIRMATION IS NEEDED     FOR ANY PURPOSE, NOTIFY LAB     WITHIN 5 DAYS.                LOWEST DETECTABLE LIMITS     FOR URINE DRUG SCREEN     Drug Class       Cutoff (ng/mL)     Amphetamine      1000     Barbiturate      200     Benzodiazepine   200     Tricyclics       300     Opiates          300     Cocaine          300     THC              50    No results found.  Review of Systems  Gastrointestinal:       Patient has no teeth in her mouth.  Musculoskeletal: Positive for back pain.  Psychiatric/Behavioral: Positive for depression and suicidal ideas (Without plan). Negative for hallucinations, memory loss and substance abuse. The patient is nervous/anxious and has insomnia.    Blood pressure 161/104, pulse 84, temperature 97.5 F (36.4 C), temperature source Oral, resp. rate 17, SpO2 95.00%. Physical Exam  Constitutional: She is oriented to person, place, and time. She appears well-developed and well-nourished.  HENT:  Head: Normocephalic and atraumatic.  Eyes: Pupils are  equal, round, and reactive to light.  Neck: Normal range of motion.  Respiratory: Effort normal.  Musculoskeletal: Normal range of motion.  Neurological: She is alert and oriented to person, place, and time.  Skin: Skin is warm and dry.  Psychiatric: Her mood appears anxious. Her speech is slurred. She exhibits a depressed mood (tearful). She expresses suicidal ideation. She expresses no suicidal plans. She exhibits abnormal recent memory.    Assessment/Plan:  Face to Face and consult with Dr. Elsie Saas  In patient treatment  Patient accepted to [facility] 1. Admit for crisis management and stabilization.  2. Review and initiate  medications pertinent to patient illness and treatment.  3. Medication management  to reduce current symptoms to base line and improve the         patient's overall level of functioning.   Morgan B. Rankin FNP-BC Family Nurse Practitioner, Board Certified  Hopkins, Morgan 08/09/2012, 3:34 PM    Patient is seen and personally examined and made treatment plan. Case discussed with nurse practitioner and reviewed the information documented and agree with the treatment plan.  [name],[name]. 08/09/2012 5:18 PM

## 2012-08-09 NOTE — ED Notes (Signed)
telepsych info faxed and called 

## 2012-08-09 NOTE — BH Assessment (Signed)
[facility] Assessment Progress Note  Per Scherrie Merritts, LCSW, Assessment Counselor, pt has been examined by Assunta Found, FNP. She agrees to accept pt to [facility] to the service of Leata Mouse, MD, Rm 501-2. Per Jacquelyne Balint, RN, Administrative Coordinator, pt is to be transferred as close to 19:30 as is practical.   Doylene Canning, MA  Assessment Counselor  08/09/2012 @ 16:11

## 2012-08-09 NOTE — BHH Counselor (Signed)
Shuvon Rankin, FNP agrees to accept pt to [facility] to the service of Leata Mouse, MD, Rm 501-2. Support paperwork signed and faxed to [facility]. [employer] placed in pt's chart. Latricia RN notified Radford Pax MD who agreed with disposition.  Evette Cristal, LCSWA Assessment Counselor

## 2012-08-09 NOTE — ED Provider Notes (Addendum)
Filed Vitals:   08/09/12 0601  BP: 152/94  Pulse: 70  Temp: 98.5 F (36.9 C)  Resp: 18   Patient is resting comfortably this morning. She denies any complaints. She was seen here for depression but is denying suicidal or homicidal ideations. She is currently homeless. She likely will be discharged later this morning with homeless resources.   11:58 pt says that she will kill herself if we let discharge her.  I feel that this is probable malingering behavior, but will get telepsych evaluation Rolan Bucco, MD 08/09/12 9811  Rolan Bucco, MD 08/09/12 1159

## 2012-08-09 NOTE — Tx Team (Signed)
Initial Interdisciplinary Treatment Plan  PATIENT STRENGTHS: (choose at least two) Ability for insight Average or above average intelligence Capable of independent living  PATIENT STRESSORS: Financial difficulties   PROBLEM LIST: Problem List/Patient Goals Date to be addressed Date deferred Reason deferred Estimated date of resolution  Depression 08/09/12                                                      DISCHARGE CRITERIA:  Ability to meet basic life and health needs Improved stabilization in mood, thinking, and/or behavior  PRELIMINARY DISCHARGE PLAN: Attend aftercare/continuing care group Placement in alternative living arrangements  PATIENT/FAMIILY INVOLVEMENT: This treatment plan has been presented to and reviewed with the patient, [name], and/or family member, .  The patient and family have been given the opportunity to ask questions and make suggestions.  [name], [place] 08/09/2012, 10:17 PM

## 2012-08-09 NOTE — ED Notes (Signed)
Notified Dr.Belfi of elevated BP-advised to give PRN Ativan.

## 2012-08-09 NOTE — Consult Note (Signed)
Reason for Consult: Depression Referring Physician: Dr. [name] is an 62 y.o. female.  HPI: Patient was seen and chart reviewed. Patient presents voluntarily to [facility] with depression and suicidal ideations. Patient stated that she has been feeling depressed, sad, tearful, dysphoric, loss of interest and wants to end her life and repeats that she does not want live anymore. She has been stressed about her limited financial and social support. She has limited contact with her siblings and has no children. She has been worried about loosing her place because she currently lives with two other friends and that the friend who rents the apartment will be evicted next week. Reportedly two people lives in there are alcoholic but does not misbehaves to her. She has been staying in the same place over several years. She was hospitalized at [facility] for suicide attempt 15 years ago. She describes "real depressed" mood. Affect is mood congruent. Pt endorses fatigue, loss of interest in usual pleasures and crying spells. She denies substance use and says her roommates use alcohol and drugs frequently which makes her uncomfortable. UDS is positive for barbiturates and benzodiazepines. It is not clear how she is positive for barbiturates.   MSE: Patient appeared older than her stated age, dysphoric mood and congruent affect with her mood. She has decreased psychomotor activity and poor eye contact. She has normal speech and linear and goal directed thought process. She has suicidal ideation but no specific plan or intension. She has denied A/V hallucinations, delusions and paranoia. She has fair to poor insight, judgment and impulse control.  Past Medical History  Diagnosis Date  . Hypertension   . Depression   . Renal disorder kidney stones  . Back pain 05/30    history of back surgury and rods and pins    Past Surgical History  Procedure Laterality Date  . Abdominal hysterectomy    . Cholecystectomy     . Kidney stones    . Back surgery      Family History  Problem Relation Age of Onset  . Alzheimer's disease Mother   . Heart failure Mother   . Stroke Father   . Coronary artery disease Sister   . Coronary artery disease Sister   . Coronary artery disease Sister     Social History:  reports that she has been smoking Cigarettes.  She has been smoking about 0.25 packs per day. She has never used smokeless tobacco. She reports that she does not drink alcohol or use illicit drugs.  Allergies:  Allergies  Allergen Reactions  . Compazine Rash    Doesn't recall reaction  . Aspirin Other (See Comments)    Doesn't recall reaction; pt is currently taking 81mg   . Dilaudid (Hydromorphone Hcl) Hives    Sometimes tolerates  . Influenza Vaccines Other (See Comments)    Got pneumonia, will not take vaccine  . Metoclopramide Hcl Hives  . Zofran Hives    Medications: I have reviewed the patient's current medications.  Results for orders placed during the hospital encounter of 08/08/12 (from the past 48 hour(s))  CBC     Status: None   Collection Time    08/08/12  5:27 PM      Result Value Range   WBC 5.5  4.0 - 10.5 K/uL   RBC 4.11  3.87 - 5.11 MIL/uL   Hemoglobin 12.8  12.0 - 15.0 g/dL   HCT 47.8  29.5 - 62.1 %   MCV 90.8  78.0 -  100.0 fL   MCH 31.1  26.0 - 34.0 pg   MCHC 34.3  30.0 - 36.0 g/dL   RDW 30.8  65.7 - 84.6 %   Platelets 230  150 - 400 K/uL  COMPREHENSIVE METABOLIC PANEL     Status: Abnormal   Collection Time    08/08/12  5:27 PM      Result Value Range   Sodium 140  135 - 145 mEq/L   Potassium 3.9  3.5 - 5.1 mEq/L   Chloride 103  96 - 112 mEq/L   CO2 27  19 - 32 mEq/L   Glucose, Bld 99  70 - 99 mg/dL   BUN 24 (*) 6 - 23 mg/dL   Creatinine, Ser 9.62  0.50 - 1.10 mg/dL   Calcium 9.4  8.4 - 95.2 mg/dL   Total Protein 7.1  6.0 - 8.3 g/dL   Albumin 3.8  3.5 - 5.2 g/dL   AST 29  0 - 37 U/L   ALT 25  0 - 35 U/L   Alkaline Phosphatase 66  39 - 117 U/L   Total  Bilirubin 0.2 (*) 0.3 - 1.2 mg/dL   GFR calc non Af Amer 89 (*) >90 mL/min   GFR calc Af Amer >90  >90 mL/min   Comment:            The eGFR has been calculated     using the CKD EPI equation.     This calculation has not been     validated in all clinical     situations.     eGFR's persistently     <90 mL/min signify     possible Chronic Kidney Disease.  TROPONIN I     Status: None   Collection Time    08/08/12  5:27 PM      Result Value Range   Troponin I <0.30  <0.30 ng/mL   Comment:            Due to the release kinetics of cTnI,     a negative result within the first hours     of the onset of symptoms does not rule out     myocardial infarction with certainty.     If myocardial infarction is still suspected,     repeat the test at appropriate intervals.  URINE RAPID DRUG SCREEN (HOSP PERFORMED)     Status: Abnormal   Collection Time    08/08/12  7:05 PM      Result Value Range   Opiates NONE DETECTED  NONE DETECTED   Cocaine NONE DETECTED  NONE DETECTED   Benzodiazepines POSITIVE (*) NONE DETECTED   Amphetamines NONE DETECTED  NONE DETECTED   Tetrahydrocannabinol NONE DETECTED  NONE DETECTED   Barbiturates POSITIVE (*) NONE DETECTED   Comment:            DRUG SCREEN FOR MEDICAL PURPOSES     ONLY.  IF CONFIRMATION IS NEEDED     FOR ANY PURPOSE, NOTIFY LAB     WITHIN 5 DAYS.                LOWEST DETECTABLE LIMITS     FOR URINE DRUG SCREEN     Drug Class       Cutoff (ng/mL)     Amphetamine      1000     Barbiturate      200     Benzodiazepine   200     Tricyclics  300     Opiates          300     Cocaine          300     THC              50    No results found.  Positive for anorexia, anxiety, bad mood, depression, learning difficulty and sleep disturbance Blood pressure 161/104, pulse 84, temperature 97.5 F (36.4 C), temperature source Oral, resp. rate 17, SpO2 95.00%.   Assessment/Plan: Major depressive disorder, single, severe without  psychosis Anxiety disorder NOS  Recommendation: Patient needs in patient psychiatric hospitalization for crisis stabilization and safety.  [name],Morgan R. 08/09/2012, 2:57 PM

## 2012-08-10 DIAGNOSIS — F411 Generalized anxiety disorder: Secondary | ICD-10-CM

## 2012-08-10 MED ORDER — CLONAZEPAM 0.5 MG PO TABS
0.5000 mg | ORAL_TABLET | Freq: Two times a day (BID) | ORAL | Status: DC
  Administered 2012-08-10 – 2012-08-11 (×2): 0.5 mg via ORAL
  Filled 2012-08-10 (×2): qty 1

## 2012-08-10 MED ORDER — HYDROXYZINE HCL 50 MG PO TABS
50.0000 mg | ORAL_TABLET | Freq: Every evening | ORAL | Status: DC | PRN
  Administered 2012-08-10 – 2012-08-13 (×3): 50 mg via ORAL
  Filled 2012-08-10: qty 1

## 2012-08-10 MED ORDER — ESCITALOPRAM OXALATE 10 MG PO TABS
10.0000 mg | ORAL_TABLET | Freq: Every day | ORAL | Status: DC
  Administered 2012-08-10 – 2012-08-11 (×2): 10 mg via ORAL
  Filled 2012-08-10 (×3): qty 1

## 2012-08-10 NOTE — Progress Notes (Signed)
D:  Patient's self inventory sheet, patient sleeps well, good appetite, normal energy level, good attention span.  Rated depression #8, hopelessness #7.  Denied withdrawals.  Denied SI.  Has experienced pain.  Pain goal today #1, worst pain #3.  "I want to find a better place to live, I do not like where I live at all.  To help me be place in a better living situation."   A:  Medications administered per MD orders.  Emotional support and encouragement given patient. R:  Denied SI and HI.  Denied A/V hallucinations. Will continue to monitor patient with 15 minute checks for safety.  Safety maintained.

## 2012-08-10 NOTE — Progress Notes (Signed)
Adult Psychoeducational Group Note  Date:  08/10/2012 Time:  8:00PM Group Topic/Focus:  Goals Group:   The focus of this group is to help patients establish daily goals to achieve during treatment and discuss how the patient can incorporate goal setting into their daily lives to aide in recovery.  Participation Level:  Active  Participation Quality:  Appropriate and Attentive  Affect:  Appropriate  Cognitive:  Alert  Insight: Appropriate  Engagement in Group:  Engaged  Modes of Intervention:  Discussion  Additional Comments:  Pt. Was appropriate and attentive tonight during group. Pt. Stated that she had a ok day. Pt. Was able to talk with nurse and discuss how she wasn't feeling good. Pt stated that she was able to get the much needed rest today due to being stressed.   Bing Plume D 08/10/2012, 9:07 PM

## 2012-08-10 NOTE — Tx Team (Signed)
Interdisciplinary Treatment Plan Update (Adult)  Date: 08/10/2012  Time Reviewed:  9:45 AM  Progress in Treatment: Attending groups: Yes Participating in groups:  Yes Taking medication as prescribed:  Yes Tolerating medication:  Yes Family/Significant othe contact made: CSW assessing  Patient understands diagnosis:  Yes Discussing patient identified problems/goals with staff:  Yes Medical problems stabilized or resolved:  Yes Denies suicidal/homicidal ideation: Yes Issues/concerns per patient self-inventory:  Yes Other:  New problem(s) identified: N/A  Discharge Plan or Barriers: CSW assessing for appropriate referrals.  Reason for Continuation of Hospitalization: Anxiety Depression Medication Stabilization  Comments: N/A  Estimated length of stay: 3-5 days  For review of initial/current patient goals, please see plan of care.  Attendees: Patient:     Family:     Physician:  Dr. Johnalagadda 08/10/2012 11:24 AM   Nursing:   Beverly Knight, RN 08/10/2012 11:24 AM   Clinical Social Worker:  Cathryn Gallery Horton, LCSWA 08/10/2012 11:24 AM   Other:Andrea Thorne, RN  08/10/2012 11:24 AM   Other:  Maseta Dorley, MA care coordination 08/10/2012 11:24 AM   Other:   08/10/2012 11:24 AM   Other:     Other:    Other:    Other:    Other:    Other:    Other:     Scribe for Treatment Team:   Horton, Addelynn Batte Nicole, 08/10/2012 11:24 AM   

## 2012-08-10 NOTE — Progress Notes (Signed)
NUTRITION ASSESSMENT  Patient meets criteria for underweight AEB BMI <18.  Pt identified as at risk on the Malnutrition Screen Tool  INTERVENTION: 1. Educated patient on the importance of nutrition and encouraged intake of food and beverages.  NUTRITION DIAGNOSIS: Increased nutrient needs related to increased metabolism AEB diet hx and underweight.  Goal: Pt to meet >/= 90% of their estimated nutrition needs.  Monitor:  PO intake  Assessment:  Patient admitted with depression and concerns over housing.  Patient reports UBW of 90-100 lbs.  Chart indicates UBW 100-105 lbs recently.  Patient meets criteria for underweight.  Patient states that she is eating well currently and prior to admit.  States that she receives food stamps which help.  For snack this afternoon, patient has 2 ice cream, applesauce, pretzels, and juice.   Patient asked when the doctor was coming because she was ready to come home then stated, "I lied.  My chest hurts." then began crying.  Discussed with nurse and tech.  Patient requested something for the pain.  62 y.o. female  Height: Ht Readings from Last 1 Encounters:  08/09/12 5\' 3"  (1.6 m)    Weight: Wt Readings from Last 1 Encounters:  08/09/12 99 lb 3.3 oz (45 kg)    Weight Hx: Wt Readings from Last 10 Encounters:  08/09/12 99 lb 3.3 oz (45 kg)  07/31/12 100 lb (45.36 kg)  07/28/12 105 lb 6.4 oz (47.809 kg)  07/17/12 95 lb (43.092 kg)  03/04/12 105 lb (47.628 kg)  02/27/12 109 lb (49.442 kg)  06/18/11 98 lb 8 oz (44.679 kg)  02/02/11 117 lb (53.071 kg)    BMI:  Body mass index is 17.58 kg/(m^2). Pt meets criteria for underweight based on current BMI.  Estimated Nutritional Needs: Kcal: 25-30 kcal/kg Protein: > 1 gram protein/kg Fluid: 1 ml/kcal  Diet Order: General Pt is also offered choice of unit snacks mid-morning and mid-afternoon.  Pt is eating as desired.   Lab results and medications reviewed.   Oran Rein, RD, LDN Clinical  Inpatient Dietitian Pager:  [phone] Weekend and after hours pager:  [phone]

## 2012-08-10 NOTE — [facility] Group Notes (Signed)
[facility] LCSW Aftercare Discharge Planning Group Note  08/10/2012 8:45 AM   Participation Quality: Alert and Appropriate   Mood/Affect: Appropriate, Flat and Depressed  Depression Rating: 8  Anxiety Rating: 8  Thoughts of Suicide: Pt denies SI/HI   Will you contract for safety? Yes   Current AVH: No   Plan for Discharge/Comments: Pt attended discharge planning group and actively participated in group. CSW provided pt with today's workbook. Pt states that she came to the hospital for depression.  Pt states that she sees Vear Clock, NP for medication management. Pt lives in [place] and can return home but reports that she doesn't want to. No further needs voiced by pt at this time.   Transportation Means: Pt has access to transportation   Supports: No supports mentioned at this time  Reyes Ivan, LCSWA  08/10/2012 9:35 AM
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
## 2012-08-10 NOTE — Progress Notes (Signed)
Adult Psychoeducational Group Note  Date:  08/10/2012 Time:  11:20AM  Group Topic/Focus:  Self Care:   The focus of this group is to help patients understand the importance of self-care in order to improve or restore emotional, physical, spiritual, interpersonal, and financial health.  Participation Level:  Active  Participation Quality:  Appropriate  Affect:  Appropriate  Cognitive:  Appropriate  Insight: Appropriate  Engagement in Group:  Engaged  Modes of Intervention:  Discussion  Additional Comments:  Pt was active throughout group  Makyla Bye K 08/10/2012, 12:28 PM

## 2012-08-10 NOTE — [facility] Suicide Risk Assessment (Signed)
Suicide Risk Assessment  Admission Assessment     Nursing information obtained from:  Patient Demographic factors:  Caucasian;Low socioeconomic status;Unemployed Current Mental Status:  NA Loss Factors:  Financial problems / change in socioeconomic status Historical Factors:  Family history of mental illness or substance abuse Risk Reduction Factors:  Sense of responsibility to family  CLINICAL FACTORS:   Severe Anxiety and/or Agitation Panic Attacks Depression:   Anhedonia Hopelessness Insomnia Recent sense of peace/wellbeing Severe Chronic Pain Unstable or Poor Therapeutic Relationship Previous Psychiatric Diagnoses and Treatments Medical Diagnoses and Treatments/Surgeries  COGNITIVE FEATURES THAT CONTRIBUTE TO RISK:  Closed-mindedness Loss of executive function Polarized thinking    SUICIDE RISK:   Moderate:  Frequent suicidal ideation with limited intensity, and duration, some specificity in terms of plans, no associated intent, good self-control, limited dysphoria/symptomatology, some risk factors present, and identifiable protective factors, including available and accessible social support.  PLAN OF CARE: this is a voluntary admission for depression and suicidal ideations with intention to end her life. She is seeking for help with therapy and medications.   I certify that inpatient services furnished can reasonably be expected to improve the patient's condition.  [name],Morgan R. 08/10/2012, 1:27 PM

## 2012-08-10 NOTE — Progress Notes (Addendum)
Patient ID: [name], female   DOB: December 08, 1950, 62 y.o.   MRN: [id number] D: Pt. Lying in bed, minimal interaction "I just don't feel good" Pt. Was not able to rate or state if any pain. No grimacing noted, just sleeping. A: Writer introduced self to client and encouraged group, but client refused. "just need to lay here, I'll be all right. Staff will monitor q43min for safety. R: Pt. Is safe on the unit. Pt. Changed her mind and went to group.
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
## 2012-08-10 NOTE — Progress Notes (Signed)
Patient ID: EKAM BONEBRAKE, female   DOB: March 01, 1951, 62 y.o.   MRN: [id number] D)  Resting quietly, eyes closed, resp reg, non-labored, no c/o's voiced.   A)  Will continue to monitor for safety,  R)  Safety maintained.

## 2012-08-11 DIAGNOSIS — F329 Major depressive disorder, single episode, unspecified: Principal | ICD-10-CM

## 2012-08-11 MED ORDER — ESCITALOPRAM OXALATE 20 MG PO TABS
20.0000 mg | ORAL_TABLET | Freq: Every day | ORAL | Status: DC
  Administered 2012-08-12 – 2012-08-14 (×3): 20 mg via ORAL
  Filled 2012-08-11: qty 3
  Filled 2012-08-11 (×4): qty 1

## 2012-08-11 MED ORDER — GABAPENTIN 100 MG PO CAPS
100.0000 mg | ORAL_CAPSULE | Freq: Three times a day (TID) | ORAL | Status: DC
  Administered 2012-08-11 – 2012-08-12 (×4): 100 mg via ORAL
  Filled 2012-08-11 (×10): qty 1

## 2012-08-11 MED ORDER — CLONAZEPAM 0.5 MG PO TABS
0.5000 mg | ORAL_TABLET | Freq: Three times a day (TID) | ORAL | Status: DC
  Administered 2012-08-11 – 2012-08-14 (×9): 0.5 mg via ORAL
  Filled 2012-08-11 (×11): qty 1
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
## 2012-08-11 NOTE — Progress Notes (Signed)
Adult Psychoeducational Group Note  Date:  08/11/2012 Time:  11:59 AM  Group Topic/Focus:  Recovery Goals:   The focus of this group is to identify appropriate goals for recovery and establish a plan to achieve them.  Participation Level:  Did Not Attend  Participation Quality:  did not attenf  Affect:  Did Not Attend  Cognitive:  Did Not Attend  Insight: None  Engagement in Group:  None  Modes of Intervention:  Did Not Attend  Additional Comments:  Pt. Did not attend group. Was in bed sleeping.  Roanna Banning, [place] N 08/11/2012, 11:59 AM
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
## 2012-08-11 NOTE — [facility] Group Notes (Signed)
[facility] LCSW Group Therapy  08/10/2012  1:15 PM   Type of Therapy:  Group Therapy  Participation Level:  Did Not Attend  Reyes Ivan, LCSWA 08/10/2012 2:22 PM

## 2012-08-11 NOTE — [facility] Group Notes (Signed)
[place] LCSW Aftercare Discharge Planning Group Note   08/11/2012 8:45 AM  Participation Quality:  Did Not Attend   Reyes Ivan, LCSWA 08/11/2012 9:55 AM
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
## 2012-08-11 NOTE — Progress Notes (Signed)
Recreation Therapy Notes  Date: 06.10.2014 Time: 2:45pm Location: [address] Dayroom      Group Topic/Focus: Musician (AAA/T)  Participation Level: Did not attend.   Marykay Lex Alessander Sikorski, LRT/CTRS  Jeet Shough L 08/11/2012 4:25 PM

## 2012-08-12 MED ORDER — GABAPENTIN 100 MG PO CAPS
200.0000 mg | ORAL_CAPSULE | Freq: Three times a day (TID) | ORAL | Status: DC
  Administered 2012-08-12 – 2012-08-14 (×6): 200 mg via ORAL
  Filled 2012-08-12: qty 2
  Filled 2012-08-12: qty 18
  Filled 2012-08-12 (×7): qty 2
  Filled 2012-08-12 (×2): qty 18
  Filled 2012-08-12: qty 2
  Filled 2012-08-12: qty 1

## 2012-08-12 NOTE — [facility] Group Notes (Signed)
[facility] LCSW Group Therapy  08/12/2012  1:15 PM   Type of Therapy:  Group Therapy  Participation Level:  Active  Participation Quality:  Appropriate and Attentive  Affect:  Appropriate, Flat and Depressed  Cognitive:  Alert and Appropriate  Insight:  Developing/Improving and Engaged  Engagement in Therapy:  Developing/Improving and Engaged  Modes of Intervention:  Clarification, Confrontation, Discussion, Education, Exploration, Limit-setting, Orientation, Problem-solving, Rapport Building, Dance movement psychotherapist, Socialization and Support  Summary of Progress/Problems: The topic for group today was emotional regulation.  This group focused on both positive and negative emotion identification and allowed group members to process ways to identify feelings, regulate negative emotions, and find healthy ways to manage internal/external emotions. Group members were asked to reflect on a time when their reaction to an emotion led to a negative outcome and explored how alternative responses using emotion regulation would have benefited them. Group members were also asked to discuss a time when emotion regulation was utilized when a negative emotion was experienced.  Pt shared that she doesn't have any support but was struggling with depression and decided to reach out for help.  Pt states that she feels she is getting the help she needed, in regards to finding new housing.  Pt actively listened to group discussion.     Reyes Ivan, [place] 08/12/2012 2:59 PM

## 2012-08-12 NOTE — Progress Notes (Signed)
D: Patient appropriate and cooperative with staff. Patient's affect/mood is depressed. She reported on the self inventory sheet that her sleep is fair, appetite/ability to pay attention is good and energy level is normal. Patient rated depression "4" and feelings of hopelessness "5". She's compliant with medication regimen. Patient is isolative and has little to no interaction with peers.  A: Support and encouragement provided to patient. Administered scheduled medications per ordering MD. Monitor Q15 minute checks for safety.  R: Patient receptive. Denies SI/HI/AVH. Patient remains safe on the unit.

## 2012-08-12 NOTE — Progress Notes (Signed)
Adult Psychoeducational Group Note  Date:  08/12/2012 Time:  11:42 AM  Group Topic/Focus:  Personal Choices and Values:   The focus of this group is to help patients assess and explore the importance of values in their lives, how their values affect their decisions, how they express their values and what opposes their expression.  Participation Level:  Active  Participation Quality:  Appropriate and Sharing  Affect:  Tearful  Cognitive:  Appropriate  Insight: Appropriate  Engagement in Group:  Engaged  Modes of Intervention:  Discussion  Additional Comments:  Pt identified their top three values and explained in depth why. In the future Pt. Stated who she chooses to live with is one of her values.  Tora Perches N 08/12/2012, 11:42 AM

## 2012-08-12 NOTE — Progress Notes (Signed)
Adult Psychoeducational Group Note  Date:  08/12/2012 Time:  8:00PM Group Topic/Focus:  Wrap-Up Group:   The focus of this group is to help patients review their daily goal of treatment and discuss progress on daily workbooks.  Participation Level:  Active  Participation Quality:  Appropriate and Attentive  Affect:  Appropriate  Cognitive:  Alert and Appropriate  Insight: Appropriate  Engagement in Group:  Engaged  Modes of Intervention:  Discussion  Additional Comments:  Pt. Was attentive and appropriate during tonight's group discussion. Pt. Stated that she is very happy. Pt shared how she was able to express her need and living situation to her Child psychotherapist. Pt is hoping that she can find a safer placement.   Bing Plume D 08/12/2012, 8:57 PM

## 2012-08-12 NOTE — Progress Notes (Signed)
Recreation Therapy Notes  Date: 06.11.2014  Time: 3:00pm  Location: 500 [address] Day Room  Group Topic/Focus: Problem Solving, Communication, Team Work  Participation Level:  Minimal  Participation Quality:  Appropriate  Affect:  Euthymic  Cognitive:  Appropriate  Additional Comments: Activity: Theme park manager ; Explanation: Patients were given the following objects: 10 paper clips, 5 rubber bands, 1 uninflated balloon, 1 roll of masking tape, 2 index cards, 3 sheets of paper, 2 paper cups, 3 ping pong balls. LRT marked two lines on the floor using masking tape 10 feet from each other. Patients were instructed to build a contraption out of the supplies provided to launch the ping pong balls across the line on the opposite side of the room.   Patient attended group, but had minimal participation in building the contraption. Patient observed peers and offered encouragement when needed. Patient listened to wrap up discussion about the skills needed to complete the task requested and how they can be used post d/c.  Marykay Lex Clorinda Wyble, LRT/CTRS  Jearl Klinefelter 08/12/2012 4:19 PM

## 2012-08-12 NOTE — [facility] Suicide Risk Assessment (Signed)
[facility] INPATIENT:  Family/Significant Other Suicide Prevention Education  Suicide Prevention Education:  Contact Attempts: Verner Mould - sister - no number, (name of family member/significant other) has been identified by the patient as the family member/significant other with whom the patient will be residing, and identified as the person(s) who will aid the patient in the event of a mental health crisis.  With written consent from the patient, two attempts were made to provide suicide prevention education, prior to and/or following the patient's discharge.  We were unsuccessful in providing suicide prevention education.  A suicide education pamphlet was given to the patient to share with family/significant other.  Date and time of first attempt: Pt gave consent for CSW to talk to her sister but has no phone number for her, no access to get this number and it is not listed anywhere in the chart.    Carmina Miller 08/12/2012, 9:32 AM

## 2012-08-12 NOTE — Tx Team (Signed)
Interdisciplinary Treatment Plan Update (Adult)  Date: 08/12/2012  Time Reviewed:  9:45 AM  Progress in Treatment: Attending groups: Yes Participating in groups:  Yes Taking medication as prescribed:  Yes Tolerating medication:  Yes Family/Significant othe contact made: Consent given to talk to sister but unable to reach her, no number Patient understands diagnosis:  Yes Discussing patient identified problems/goals with staff:  Yes Medical problems stabilized or resolved:  Yes Denies suicidal/homicidal ideation: Yes Issues/concerns per patient self-inventory:  Yes Other:  New problem(s) identified: N/A  Discharge Plan or Barriers: CSW assessing for appropriate referrals.  Reason for Continuation of Hospitalization: Anxiety Depression Medication Stabilization  Comments: N/A  Estimated length of stay: 4-5 days  For review of initial/current patient goals, please see plan of care.  Attendees: Patient:     Family:     Physician:  Dr. Javier Glazier 08/12/2012 9:42 AM   Nursing:   Seabron Spates, RN 08/12/2012 9:42 AM   Clinical Social Worker:  Reyes Ivan, LCSWA 08/12/2012 9:42 AM   Other: Fransisca Kaufmann, NP 08/12/2012 9:42 AM   Other:  Frankey Shown, MA care coordination 08/12/2012 9:42 AM   Other:  Juline Patch, LCSW 08/12/2012 9:42 AM   Other:  Elizbeth Squires, care coordination 08/12/2012 9:42 AM  Other:    Other:    Other:    Other:    Other:    Other:     Scribe for Treatment Team:   Carmina Miller, 08/12/2012 9:42 AM

## 2012-08-12 NOTE — Progress Notes (Signed)
Patient ID: [name], female   DOB: Oct 21, 1950, 62 y.o.   MRN: [id number] D: pt. Lying in bed reports back pain, but notes rest helps. Pt. Reports she is got to have back surgery again. A: Writer encouraged pt. Use alternative comfort measures like heat or cold, also encouraged group. Staff will monitor q70min safety checks. R: Pt. Is safe on the unit. Pt. Attended group, refused alternative comfort measures.
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
## 2012-08-13 MED ORDER — NONFORMULARY OR COMPOUNDED ITEM
1.0000 "application " | Freq: Once | Status: DC
  Filled 2012-08-13: qty 1

## 2012-08-13 MED ORDER — TUBERCULIN PPD 5 UNIT/0.1ML ID SOLN
5.0000 [IU] | Freq: Once | INTRADERMAL | Status: DC
  Administered 2012-08-13: 5 [IU] via INTRADERMAL
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
## 2012-08-13 NOTE — [facility] Group Notes (Signed)
[facility] LCSW Aftercare Discharge Planning Group Note   08/13/2012 1:21 PM  Participation Quality:  Did not attend group.  Tauni Sanks, Joesph July

## 2012-08-13 NOTE — [facility] Group Notes (Signed)
[facility] LCSW Group Therapy  [facility]  1:15 - 3: 30          08/13/2012  3:14 PM    Type of Therapy:  Group Therapy  Participation Level:  Appropriate  Participation Quality:  Appropriate  Affect:  Appropriate  Cognitive:  Attentive Appropriate  Insight:  Engaged  Engagement in Therapy:  Engaged  Modes of Intervention:  Discussion Exploration Problem-Solving Supportive  Summary of Progress/Problems: Patient listened attentively to speaker from [employer] [facility].  Patient nodded in agreement with statements made by speaker but made no comments regarding participation in the program.   [name] 08/13/2012  3:14 PM

## 2012-08-13 NOTE — Progress Notes (Signed)
D: Patient appropriate and cooperative with staff. Patient's affect/mood is depressed. She reported on the self inventory sheet that she slept well, appetite/ability to pay attention are good and energy level is low. Patient rated depression "4" and feelings of hopelessness "5". Patient spoke to writer earlier, in tears, verbalizing that she was not wanting to be discharged today because she's still very depressed. Writer spoke with Morgan Rieger, NP and social worker about the patient's concerns and was informed that placement has been found for the patient and that she will d/c on tomorrow. Patient has been made aware of this info and verbalized that she feels much better about things now.  A: Support and encouragement provided to patient. Scheduled medications administered per MD orders. Maintain Q15 minute checks for safety.  R: Patient receptive. Denies SI/HI/AVH. Patient remains safe.
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
## 2012-08-14 DIAGNOSIS — F329 Major depressive disorder, single episode, unspecified: Secondary | ICD-10-CM

## 2012-08-14 DIAGNOSIS — F411 Generalized anxiety disorder: Secondary | ICD-10-CM

## 2012-08-14 MED ORDER — ASPIRIN 81 MG PO CHEW: 81.0000 mg | CHEWABLE_TABLET | Freq: Every day | ORAL | Status: AC

## 2012-08-14 MED ORDER — GABAPENTIN 100 MG PO CAPS: 200.0000 mg | ORAL_CAPSULE | Freq: Three times a day (TID) | ORAL | Status: AC

## 2012-08-14 MED ORDER — ESCITALOPRAM OXALATE 20 MG PO TABS: 20.0000 mg | ORAL_TABLET | Freq: Every day | ORAL | Status: AC

## 2012-08-14 MED ORDER — HYDROXYZINE HCL 50 MG PO TABS: 50.0000 mg | ORAL_TABLET | Freq: Every evening | ORAL | Status: AC | PRN

## 2012-08-14 MED ORDER — CLONAZEPAM 0.5 MG PO TABS: 0.5000 mg | ORAL_TABLET | Freq: Three times a day (TID) | ORAL | Status: AC

## 2012-08-14 NOTE — Progress Notes (Signed)
Adult Psychoeducational Group Note  Date:  08/14/2012 Time:  1:15 PM  Group Topic/Focus:  Relapse Prevention Planning:   The focus of this group is to define relapse and discuss the need for planning to combat relapse.  Participation Level:  Did Not Attend  Participation Quality:  Did not Attend  Affect:  Did not Attend  Cognitive:  Did not Attend  Insight: None  Engagement in Group:  Did not Attend  Modes of Intervention:  Did not Attend  Additional Comments:    Cathlean Cower 08/14/2012, 1:15 PM
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
## 2012-08-14 NOTE — BHH Suicide Risk Assessment (Signed)
Suicide Risk Assessment  Discharge Assessment     Demographic Factors:  Adolescent or young adult, Caucasian, Low socioeconomic status and diabled  Mental Status Per Nursing Assessment::   On Admission:  NA  Current Mental Status by Physician: Patient is calm and cooperative. she has been attending the groups and taking her medications. she continue to feel depression and anxieity about leaving hospital and she has denied Suicidal ideations, intention and plans. she has fair insight, judgment and impulsivity.   Loss Factors: Financial problems/change in socioeconomic status  Historical Factors: NA  Risk Reduction Factors:   Religious beliefs about death, Living with another person, especially a relative and Positive coping skills or problem solving skills  Continued Clinical Symptoms:  Depression:   Anhedonia Hopelessness Insomnia Recent sense of peace/wellbeing Severe Medical Diagnoses and Treatments/Surgeries  Cognitive Features That Contribute To Risk:  Polarized thinking    Suicide Risk:  Mild:  Suicidal ideation of limited frequency, intensity, duration, and specificity.  There are no identifiable plans, no associated intent, mild dysphoria and related symptoms, good self-control (both objective and subjective assessment), few other risk factors, and identifiable protective factors, including available and accessible social support.  Discharge Diagnoses:   AXIS I:  Generalized Anxiety Disorder and Major Depression, single episode AXIS II:  Deferred AXIS III:   Past Medical History  Diagnosis Date  . Hypertension   . Depression   . Renal disorder kidney stones  . Back pain 05/30    history of back surgury and rods and pins   AXIS IV:  economic problems, educational problems, housing problems, occupational problems, other psychosocial or environmental problems, problems related to social environment and problems with primary support group AXIS V:  51-60 moderate  symptoms  Plan Of Care/Follow-up recommendations:  Activity:  as tolerated Diet:  regular  Is patient on multiple antipsychotic therapies at discharge:  No   Has Patient had three or more failed trials of antipsychotic monotherapy by history:  No  Recommended Plan for Multiple Antipsychotic Therapies: Not applicable  [name],Morgan R. 08/14/2012, 9:29 AM

## 2012-08-14 NOTE — Progress Notes (Signed)
Discharge Note: Discharge instructions/prescriptions/medication samples given to patient. Patient verbalized understanding of discharge instructions and prescriptions. Returned belongings to patient. Denies SI/HI/AVH. Patient d/c without incident to the lobby and transported by a representative from the [facility] (assisted living facility).

## 2012-08-14 NOTE — Progress Notes (Signed)
Patient ID: [name], female   DOB: 12/11/1950, 62 y.o.   MRN: [id number] D: pt. Lying in bed but gets up for group. No complaints reported. R: Writer encouraged group. Staff will monitor q19min for safety. R: Pt. Is safe on the unit. Pt. Attends karaoke.

## 2012-08-14 NOTE — Progress Notes (Signed)
Adult Psychoeducational Group Note  Date:  08/14/2012 Time:  12:18 PM  Group Topic/Focus:  Goals Group:   The focus of this group is to help patients establish daily goals to achieve during treatment and discuss how the patient can incorporate goal setting into their daily lives to aide in recovery.  Participation Level:  Did Not Attend  Participation Quality:  Did not Attend  Affect:  Did not Attend  Cognitive:  Did not Attend  Insight: None  Engagement in Group:  Did not Attend  Modes of Intervention:  Did not Attend  Additional Comments:  This Clinical research associate went to [employer] and asked her to participate in goals group and discussion about relapse prevention and the patient refused.     Cathlean Cower 08/14/2012, 12:18 PM

## 2012-08-14 NOTE — BHH Group Notes (Signed)
[facility] LCSW Aftercare Discharge Planning Group Note   08/14/2012 8:45 AM  Participation Quality:  Alert and Appropriate   Mood/Affect:  Appropriate  Depression Rating:  5  Anxiety Rating:  5  Thoughts of Suicide:  Pt denies SI/HI  Will you contract for safety?   Yes  Current AVH:  No  Plan for Discharge/Comments:  Pt attended discharge planning group and actively participated in group.  CSW provided pt with today's workbook.  Pt reports feeling stable to d/c today.  Pt will be moving to an assisted living facility in [place], [place] upon d/c.  Pt is hopeful about this placement.  Pt will follow up at [facility].  No further needs voiced by pt at this time.    Transportation Means: Pt has access to transportation - The facility will pick pt up today  Supports: No supports mentioned at this time  Reyes Ivan, LCSWA 08/14/2012 10:38 AM

## 2012-08-14 NOTE — Progress Notes (Signed)
[facility] Adult Case Management Discharge Plan :  Will you be returning to the same living situation after discharge: No.  Pt is moving out of stressful living environment into an assisted living facility At discharge, do you have transportation home?:Yes,  facility will pick pt up today Do you have the ability to pay for your medications:Yes,  access to meds  Release of information consent forms completed and in the chart;  Patient's signature needed at discharge.  Patient to Follow up at: Follow-up Information   Follow up with [facility] On 08/17/2012. (Appointment scheduled at 1:30 pm with Duwayne Heck for intake assessment for medication management and therapy)    Contact information:   [address]  [place],  [place]  [address] Phone: [phone]  Fax: [phone]      Patient denies SI/HI:   Yes,  denies SI/HI    Safety Planning and Suicide Prevention discussed:  Yes,  discussed with pt.  Unable to reach any family (see suicide prevention note)  Horton, Salome Arnt 08/14/2012, 12:17 PM

## 2012-08-14 NOTE — Tx Team (Signed)
Interdisciplinary Treatment Plan Update (Adult)  Date: 08/14/2012  Time Reviewed:  9:45 AM  Progress in Treatment: Attending groups: Yes Participating in groups:  Yes Taking medication as prescribed:  Yes Tolerating medication:  Yes Family/Significant othe contact made: No, attempts made Patient understands diagnosis:  Yes Discussing patient identified problems/goals with staff:  Yes Medical problems stabilized or resolved:  Yes Denies suicidal/homicidal ideation: Yes Issues/concerns per patient self-inventory:  Yes Other:  New problem(s) identified: N/A  Discharge Plan or Barriers: Pt has follow up scheduled at [facility] for outpatient medication management and therapy.    Reason for Continuation of Hospitalization: D/C today  Comments: N/A  Estimated length of stay: D/C today  For review of initial/current patient goals, please see plan of care.  Attendees: Patient:  [name]  08/14/2012 10:33 AM   Family:     Physician:  Dr. Javier Glazier 08/14/2012 10:25 AM   Nursing:   Quintella Reichert, RN 08/14/2012 10:25 AM   Clinical Social Worker:  Reyes Ivan, LCSWA 08/14/2012 10:25 AM   Other: Lowella Grip, RN 08/14/2012 10:25 AM   Other:  Frankey Shown, MA care coordination 08/14/2012 10:25 AM   Other:  Juline Patch, LCSW 08/14/2012 10:25 AM   Other:   08/14/2012 10:25 AM  Other:    Other:    Other:    Other:    Other:    Other:     Scribe for Treatment Team:   Carmina Miller, 08/14/2012 10:25 AM

## 2012-08-14 NOTE — BHH Group Notes (Signed)
[facility] LCSW Group Therapy  08/14/2012  1:15 PM   Type of Therapy:  Group Therapy  Participation Level:  Did Not Attend  Lauralyn Primes 08/14/2012 3:38 PM

## 2012-08-18 NOTE — Progress Notes (Addendum)
Patient Discharge Instructions:  After Visit Summary (AVS):   Faxed to:  08/18/12 Discharge Summary Note:   Faxed to:  08/18/12 Psychiatric Admission Assessment Note:   Faxed to:  08/18/12 Suicide Risk Assessment - Discharge Assessment:   Faxed to:  08/18/12 Faxed/Sent to the Next Level Care provider:  08/18/12 Faxed to [facility] @ [phone]  Jerelene Redden, 08/18/2012, 3:06 PM

## 2013-11-03 IMAGING — CR DG CHEST 2V
2 series · 2 of 2 positions shown · non-contrast
Comparison: Prior chest x-ray 02/24/2012

CLINICAL DATA: Chest pain

CHEST - 2 VIEW

[w chest pa]
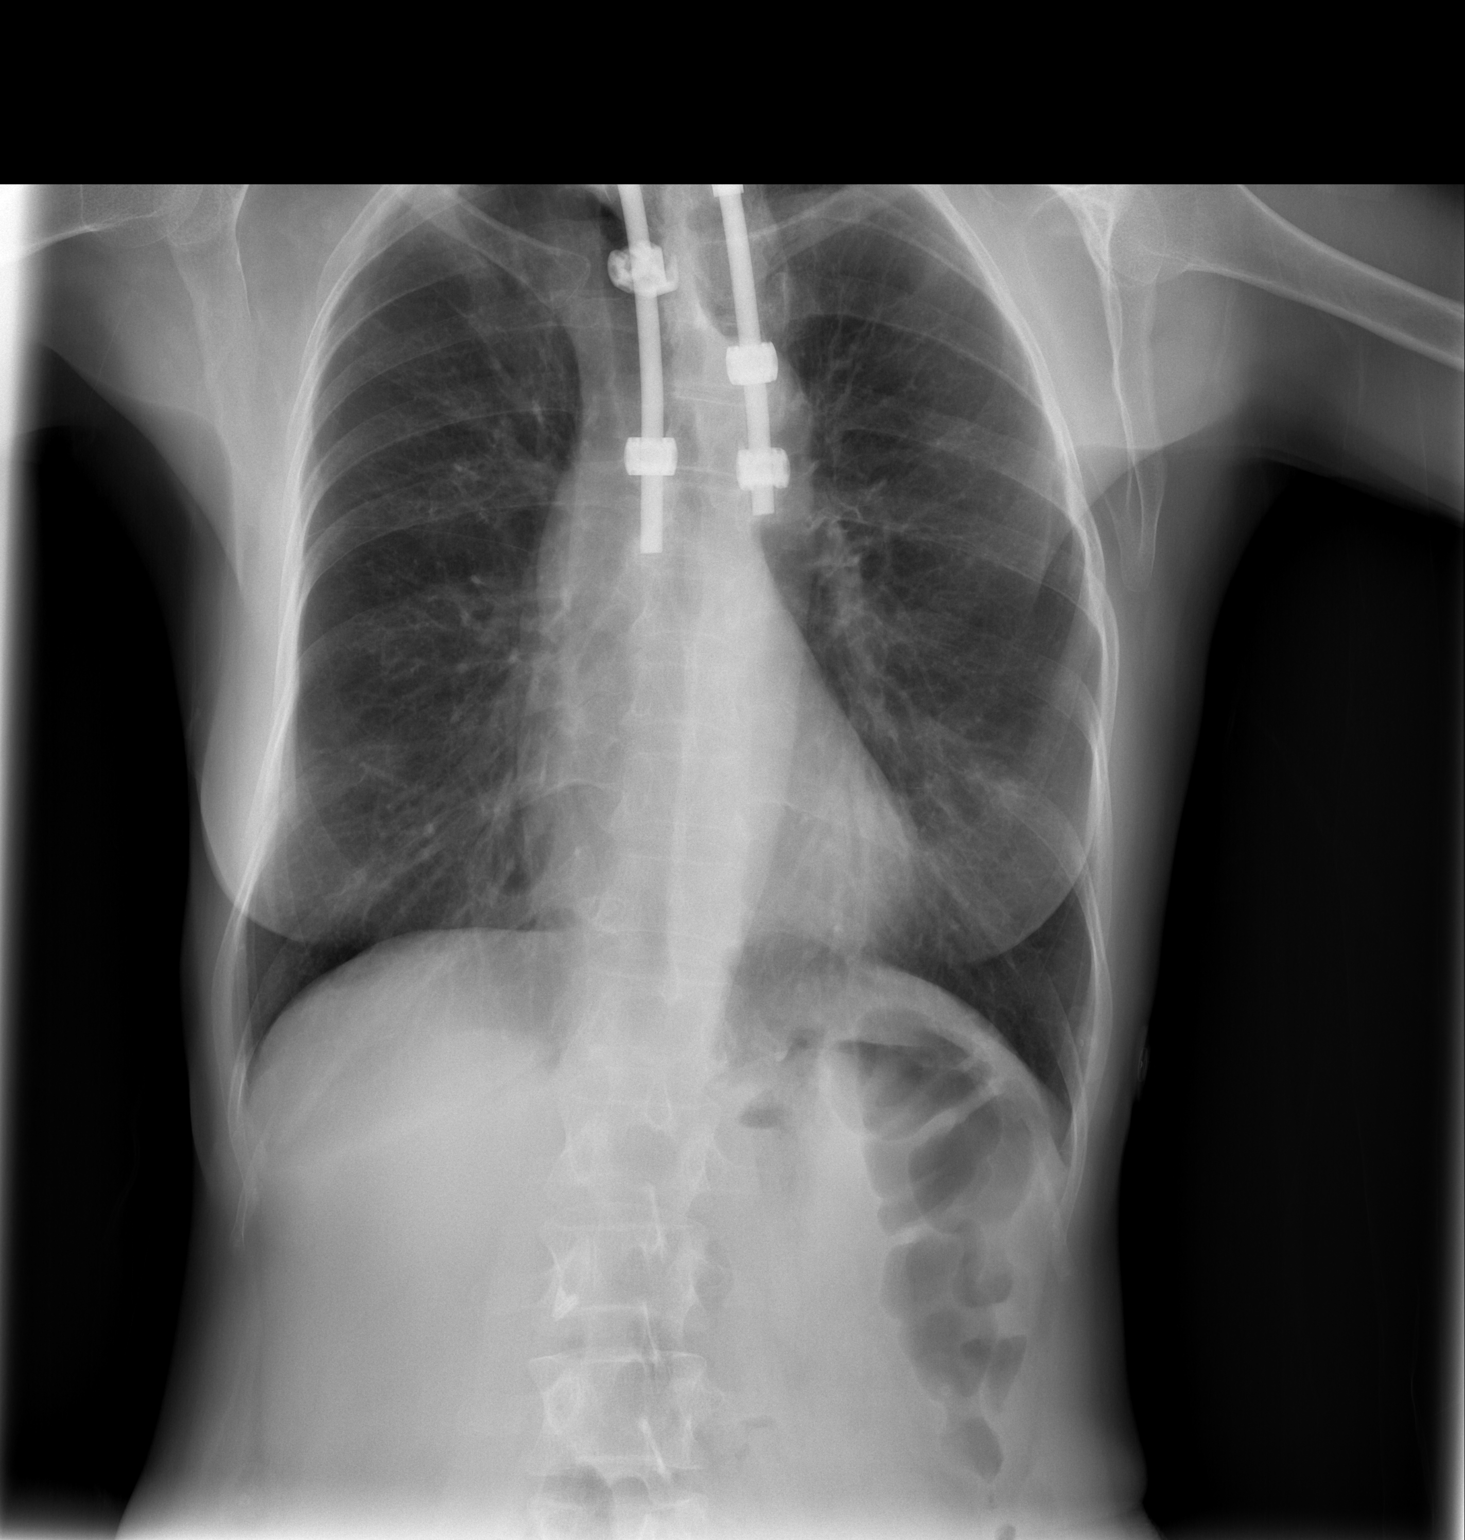

[w chest lat]
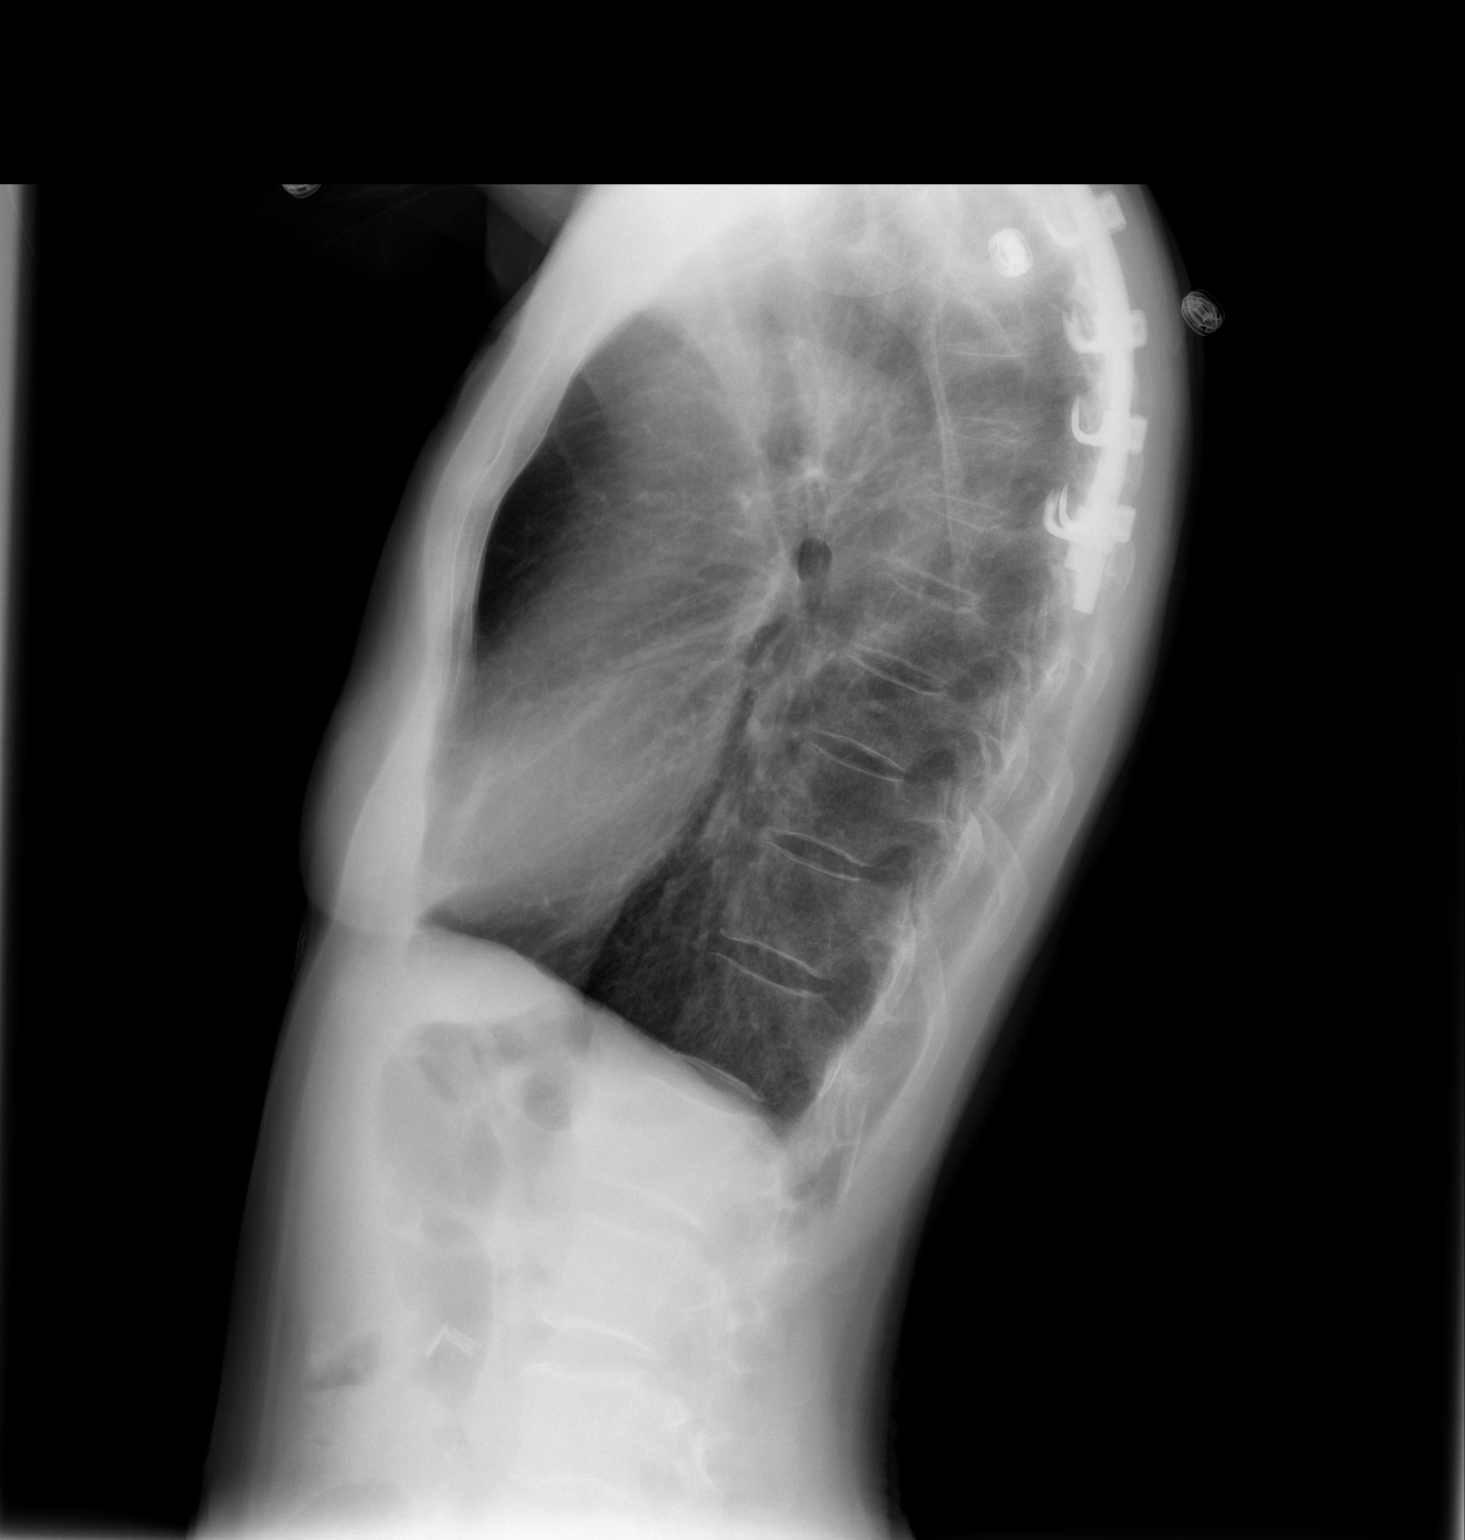

[2 of 2 positions shown; findings below may reference images not displayed]

FINDINGS: No significant interval change in the appearance of the
chest.  Negative for pulmonary edema or focal airspace
consolidation.  Unchanged pulmonary hyperexpansion consistent with
underlying COPD. Left basilar nodular opacity appears unchanged
dating back to September 2009 and likely represents a nipple shadow.
Cardiac and mediastinal contours within normal limits.  Posterior
spine stabilization hardware is unchanged.  No acute osseous
abnormality.
IMPRESSION: No acute cardiopulmonary disease]

## 2014-03-31 IMAGING — CR DG CHEST 2V
2 series · 2 of 2 positions shown · non-contrast
Comparison: 07/26/2012; 06/24/2012

CLINICAL DATA: Left-sided chest pain, nausea, history of
hypertension and COPD, smoker

CHEST - 2 VIEW

[w chest pa]
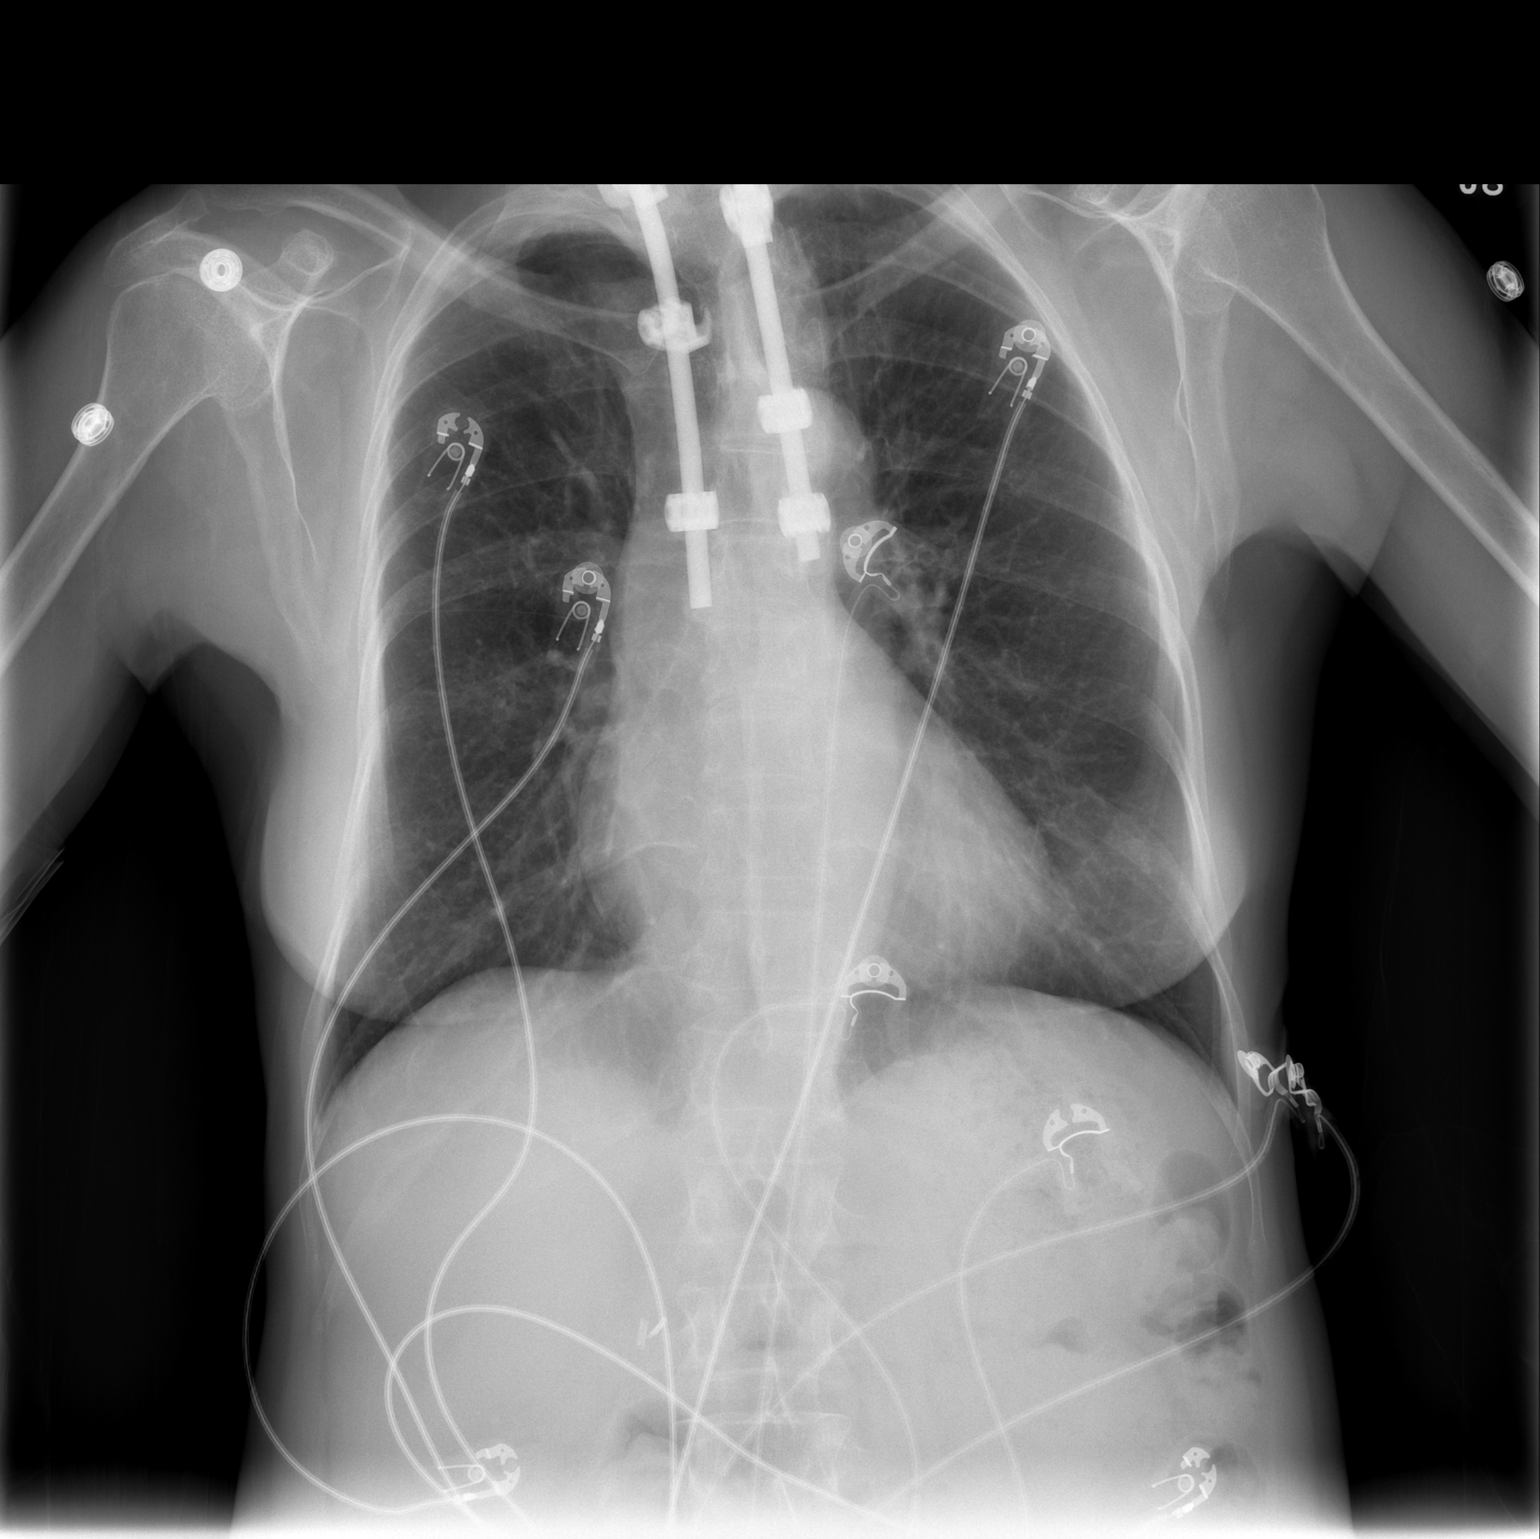

[w chest lat]
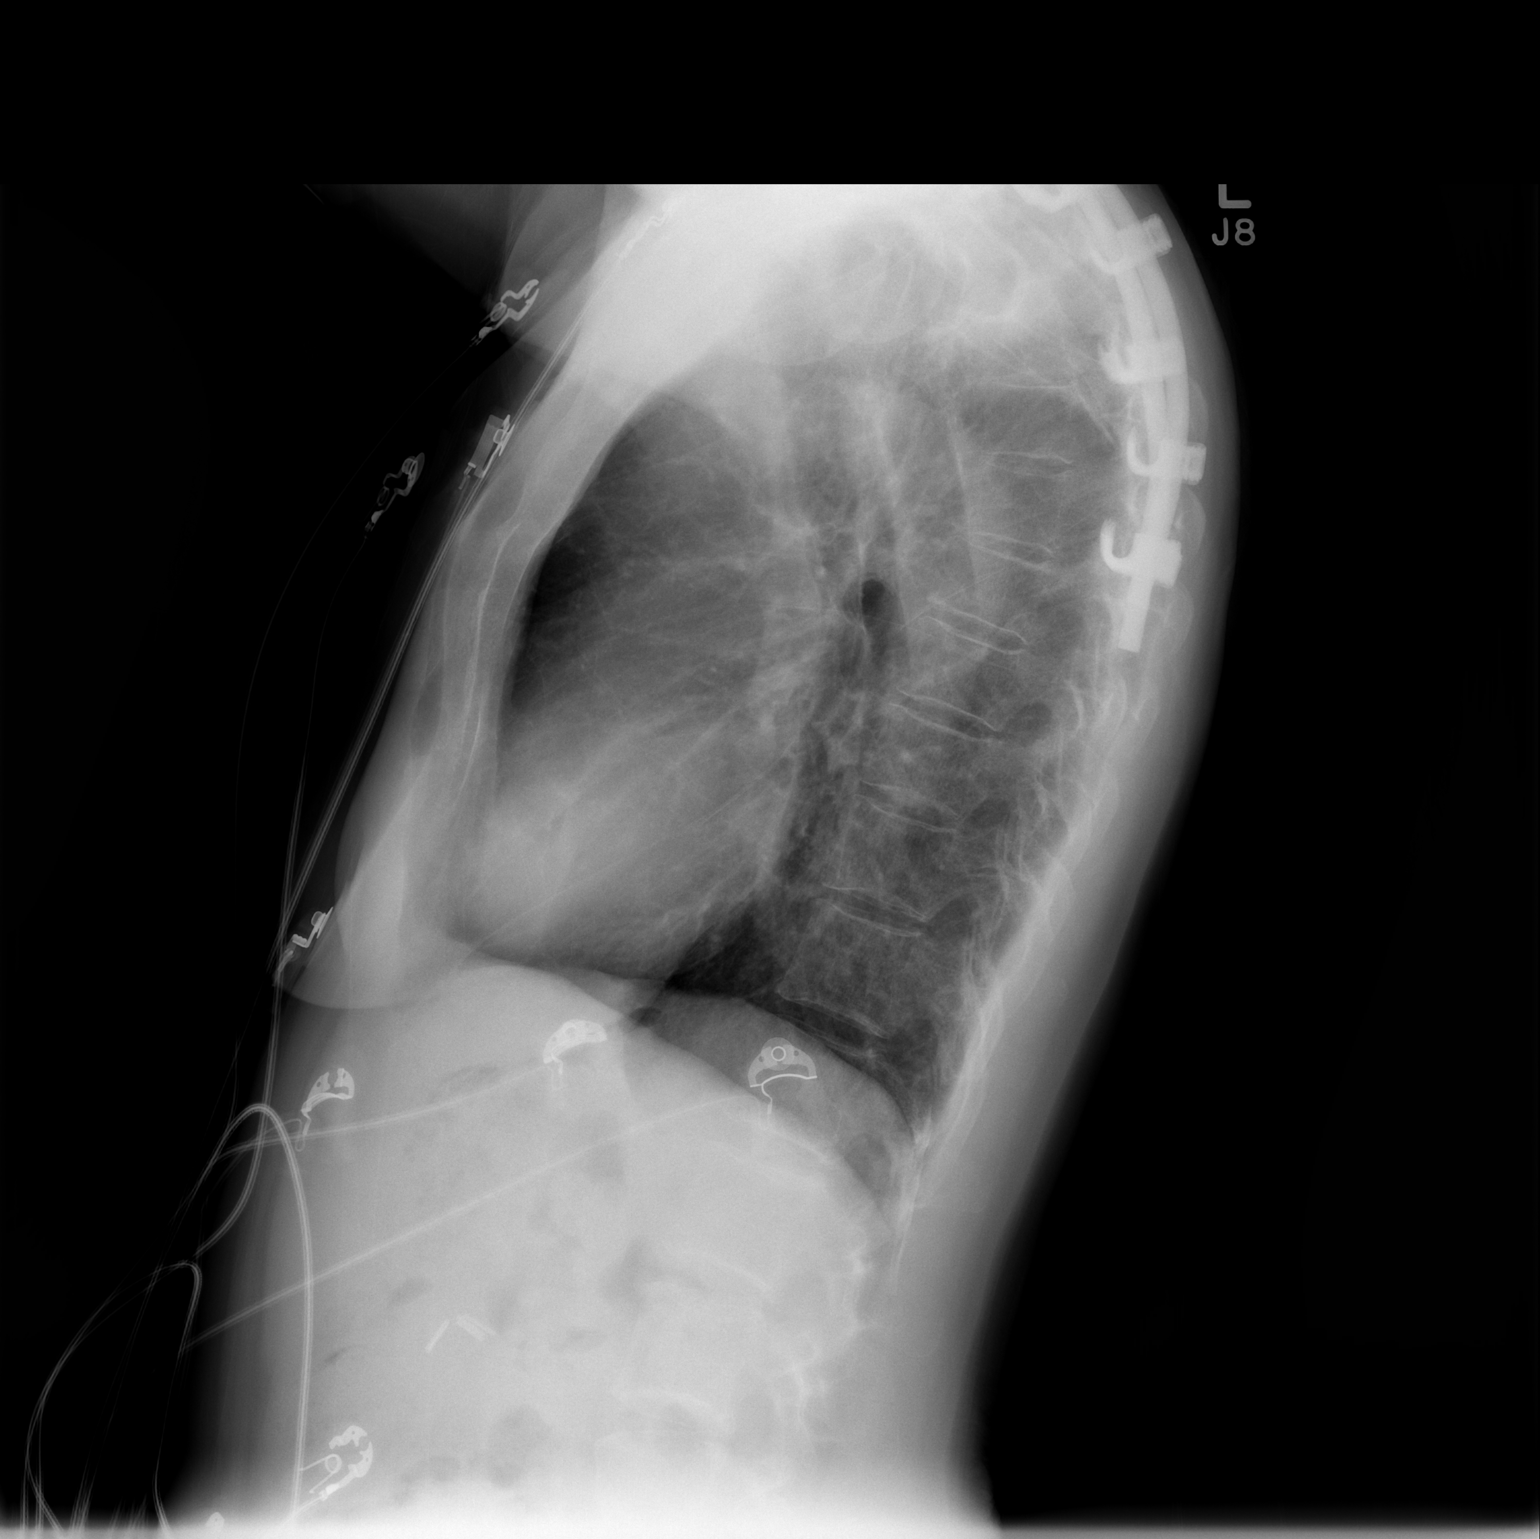

[2 of 2 positions shown; findings below may reference images not displayed]

FINDINGS: Grossly unchanged cardiac silhouette and mediastinal
contours.  The lungs remain hyperexpanded with flattening of
bilateral hemidiaphragms mild diffuse thickening of the pulmonary
tissue.  No focal airspace opacity.  No pleural effusion or
pneumothorax.  Unchanged bones including sequela of long segment
paraspinal fusion within the superior aspect of the thoracic spine,
incompletely evaluated.
IMPRESSION: Hyperexpanded lungs are acute cardiopulmonary disease.

## 2019-04-30 ENCOUNTER — Encounter: Payer: Self-pay | Admitting: Interventional Cardiology

## 2019-04-30 NOTE — Telephone Encounter (Signed)
error 

## 2019-05-01 ENCOUNTER — Telehealth: Payer: Self-pay | Admitting: Cardiology

## 2019-05-01 NOTE — Telephone Encounter (Signed)
Pt called and is a pt at [facility] but due to cost she wants to come to [place].  We have not seen since 2013.  I asked her to talk to the social worker there for help.  She wanted to call 911 to take her to [facility].   Explained this was not a good idea, she needs to discuss issues with her nurse and Child psychotherapist.  She was agreeable.

## 2019-11-03 DEATH — deceased
